# Patient Record
Sex: Male | Born: 1955 | Race: White | Hispanic: No | Marital: Married | Smoking: Current every day smoker
Health system: Southern US, Community
[De-identification: ages and names within clinical notes are randomized; demographics above are authoritative.]

## PROBLEM LIST (undated history)

## (undated) DIAGNOSIS — M47816 Spondylosis without myelopathy or radiculopathy, lumbar region: Secondary | ICD-10-CM

## (undated) DIAGNOSIS — I10 Essential (primary) hypertension: Secondary | ICD-10-CM

## (undated) DIAGNOSIS — G8928 Other chronic postprocedural pain: Secondary | ICD-10-CM

## (undated) DIAGNOSIS — M47817 Spondylosis without myelopathy or radiculopathy, lumbosacral region: Secondary | ICD-10-CM

## (undated) DIAGNOSIS — F3289 Other specified depressive episodes: Secondary | ICD-10-CM

## (undated) DIAGNOSIS — M48061 Spinal stenosis, lumbar region without neurogenic claudication: Secondary | ICD-10-CM

## (undated) DIAGNOSIS — F329 Major depressive disorder, single episode, unspecified: Secondary | ICD-10-CM

## (undated) DIAGNOSIS — G5700 Lesion of sciatic nerve, unspecified lower limb: Secondary | ICD-10-CM

## (undated) DIAGNOSIS — M5430 Sciatica, unspecified side: Secondary | ICD-10-CM

## (undated) HISTORY — DX: Major depressive disorder, single episode, unspecified: F32.9

## (undated) HISTORY — PX: KNEE SURGERY: SHX244

## (undated) HISTORY — DX: Other specified depressive episodes: F32.89

## (undated) HISTORY — DX: Spondylosis without myelopathy or radiculopathy, lumbar region: M47.816

## (undated) HISTORY — DX: Other chronic postprocedural pain: G89.28

## (undated) HISTORY — DX: Lesion of sciatic nerve, unspecified lower limb: G57.00

## (undated) HISTORY — DX: Spinal stenosis, lumbar region without neurogenic claudication: M48.061

## (undated) HISTORY — DX: Spondylosis without myelopathy or radiculopathy, lumbosacral region: M47.817

## (undated) HISTORY — DX: Sciatica, unspecified side: M54.30

---

## 2000-04-21 ENCOUNTER — Encounter (HOSPITAL_COMMUNITY): Admission: RE | Admit: 2000-04-21 | Discharge: 2000-05-21 | Payer: Self-pay | Admitting: Family Medicine

## 2000-04-24 ENCOUNTER — Encounter: Payer: Self-pay | Admitting: Urology

## 2000-04-24 ENCOUNTER — Ambulatory Visit (HOSPITAL_COMMUNITY): Admission: RE | Admit: 2000-04-24 | Discharge: 2000-04-24 | Payer: Self-pay | Admitting: Urology

## 2000-05-14 ENCOUNTER — Ambulatory Visit (HOSPITAL_COMMUNITY): Admission: RE | Admit: 2000-05-14 | Discharge: 2000-05-14 | Payer: Self-pay | Admitting: Urology

## 2000-05-14 ENCOUNTER — Encounter: Payer: Self-pay | Admitting: Urology

## 2000-10-28 ENCOUNTER — Ambulatory Visit (HOSPITAL_COMMUNITY): Admission: RE | Admit: 2000-10-28 | Discharge: 2000-10-28 | Payer: Self-pay | Admitting: Nephrology

## 2000-10-28 ENCOUNTER — Encounter: Payer: Self-pay | Admitting: Nephrology

## 2001-08-20 ENCOUNTER — Ambulatory Visit (HOSPITAL_COMMUNITY): Admission: RE | Admit: 2001-08-20 | Discharge: 2001-08-20 | Payer: Self-pay | Admitting: Internal Medicine

## 2001-08-25 ENCOUNTER — Encounter: Payer: Self-pay | Admitting: Internal Medicine

## 2001-08-25 ENCOUNTER — Ambulatory Visit (HOSPITAL_COMMUNITY): Admission: RE | Admit: 2001-08-25 | Discharge: 2001-08-25 | Payer: Self-pay | Admitting: Internal Medicine

## 2002-10-06 ENCOUNTER — Encounter
Admission: RE | Admit: 2002-10-06 | Discharge: 2003-01-04 | Payer: Self-pay | Admitting: Physical Medicine & Rehabilitation

## 2002-10-10 ENCOUNTER — Encounter: Payer: Self-pay | Admitting: Physical Medicine & Rehabilitation

## 2002-10-10 ENCOUNTER — Ambulatory Visit (HOSPITAL_COMMUNITY)
Admission: RE | Admit: 2002-10-10 | Discharge: 2002-10-10 | Payer: Self-pay | Admitting: Physical Medicine & Rehabilitation

## 2002-10-11 ENCOUNTER — Encounter (HOSPITAL_COMMUNITY)
Admission: RE | Admit: 2002-10-11 | Discharge: 2002-11-10 | Payer: Self-pay | Admitting: Physical Medicine & Rehabilitation

## 2002-11-28 ENCOUNTER — Emergency Department (HOSPITAL_COMMUNITY): Admission: EM | Admit: 2002-11-28 | Discharge: 2002-11-28 | Payer: Self-pay | Admitting: Emergency Medicine

## 2003-01-04 ENCOUNTER — Encounter
Admission: RE | Admit: 2003-01-04 | Discharge: 2003-04-04 | Payer: Self-pay | Admitting: Physical Medicine & Rehabilitation

## 2003-03-31 ENCOUNTER — Encounter
Admission: RE | Admit: 2003-03-31 | Discharge: 2003-06-29 | Payer: Self-pay | Admitting: Physical Medicine & Rehabilitation

## 2003-06-08 ENCOUNTER — Ambulatory Visit (HOSPITAL_COMMUNITY): Admission: RE | Admit: 2003-06-08 | Discharge: 2003-06-08 | Payer: Self-pay | Admitting: Family Medicine

## 2003-07-07 ENCOUNTER — Encounter
Admission: RE | Admit: 2003-07-07 | Discharge: 2003-10-05 | Payer: Self-pay | Admitting: Physical Medicine & Rehabilitation

## 2003-09-29 ENCOUNTER — Ambulatory Visit: Payer: Self-pay | Admitting: Physical Medicine & Rehabilitation

## 2003-09-29 ENCOUNTER — Encounter
Admission: RE | Admit: 2003-09-29 | Discharge: 2003-12-28 | Payer: Self-pay | Admitting: Physical Medicine & Rehabilitation

## 2003-12-04 ENCOUNTER — Encounter
Admission: RE | Admit: 2003-12-04 | Discharge: 2004-03-03 | Payer: Self-pay | Admitting: Physical Medicine & Rehabilitation

## 2003-12-05 ENCOUNTER — Ambulatory Visit: Payer: Self-pay | Admitting: Physical Medicine & Rehabilitation

## 2004-02-02 ENCOUNTER — Ambulatory Visit: Payer: Self-pay | Admitting: Physical Medicine & Rehabilitation

## 2004-04-22 ENCOUNTER — Encounter
Admission: RE | Admit: 2004-04-22 | Discharge: 2004-07-21 | Payer: Self-pay | Admitting: Physical Medicine & Rehabilitation

## 2004-04-24 ENCOUNTER — Ambulatory Visit: Payer: Self-pay | Admitting: Physical Medicine & Rehabilitation

## 2004-06-13 ENCOUNTER — Ambulatory Visit: Payer: Self-pay | Admitting: Physical Medicine & Rehabilitation

## 2004-08-01 ENCOUNTER — Encounter
Admission: RE | Admit: 2004-08-01 | Discharge: 2004-10-30 | Payer: Self-pay | Admitting: Physical Medicine & Rehabilitation

## 2004-08-05 ENCOUNTER — Ambulatory Visit: Payer: Self-pay | Admitting: Physical Medicine & Rehabilitation

## 2004-10-01 ENCOUNTER — Ambulatory Visit: Payer: Self-pay | Admitting: Physical Medicine & Rehabilitation

## 2004-11-28 ENCOUNTER — Ambulatory Visit: Payer: Self-pay | Admitting: Physical Medicine & Rehabilitation

## 2004-11-28 ENCOUNTER — Encounter
Admission: RE | Admit: 2004-11-28 | Discharge: 2005-02-26 | Payer: Self-pay | Admitting: Physical Medicine & Rehabilitation

## 2004-12-30 ENCOUNTER — Ambulatory Visit: Payer: Self-pay | Admitting: Physical Medicine & Rehabilitation

## 2005-02-25 ENCOUNTER — Ambulatory Visit: Payer: Self-pay | Admitting: Physical Medicine & Rehabilitation

## 2005-04-04 ENCOUNTER — Ambulatory Visit: Payer: Self-pay | Admitting: Physical Medicine & Rehabilitation

## 2005-04-04 ENCOUNTER — Encounter
Admission: RE | Admit: 2005-04-04 | Discharge: 2005-07-03 | Payer: Self-pay | Admitting: Physical Medicine & Rehabilitation

## 2005-05-02 ENCOUNTER — Ambulatory Visit: Payer: Self-pay | Admitting: Physical Medicine & Rehabilitation

## 2005-05-08 ENCOUNTER — Ambulatory Visit (HOSPITAL_COMMUNITY)
Admission: RE | Admit: 2005-05-08 | Discharge: 2005-05-08 | Payer: Self-pay | Admitting: Physical Medicine & Rehabilitation

## 2005-05-29 ENCOUNTER — Ambulatory Visit: Payer: Self-pay | Admitting: Physical Medicine & Rehabilitation

## 2005-06-20 ENCOUNTER — Ambulatory Visit: Payer: Self-pay | Admitting: Physical Medicine & Rehabilitation

## 2005-07-22 ENCOUNTER — Ambulatory Visit: Payer: Self-pay | Admitting: Physical Medicine & Rehabilitation

## 2005-07-22 ENCOUNTER — Encounter
Admission: RE | Admit: 2005-07-22 | Discharge: 2005-10-20 | Payer: Self-pay | Admitting: Physical Medicine & Rehabilitation

## 2005-09-04 ENCOUNTER — Ambulatory Visit: Payer: Self-pay | Admitting: Physical Medicine & Rehabilitation

## 2005-10-29 ENCOUNTER — Ambulatory Visit: Payer: Self-pay | Admitting: Physical Medicine & Rehabilitation

## 2005-10-29 ENCOUNTER — Encounter
Admission: RE | Admit: 2005-10-29 | Discharge: 2006-01-27 | Payer: Self-pay | Admitting: Physical Medicine & Rehabilitation

## 2005-12-24 ENCOUNTER — Ambulatory Visit: Payer: Self-pay | Admitting: Physical Medicine & Rehabilitation

## 2006-02-18 ENCOUNTER — Ambulatory Visit: Payer: Self-pay | Admitting: Physical Medicine & Rehabilitation

## 2006-02-18 ENCOUNTER — Encounter
Admission: RE | Admit: 2006-02-18 | Discharge: 2006-05-19 | Payer: Self-pay | Admitting: Physical Medicine & Rehabilitation

## 2006-04-02 ENCOUNTER — Ambulatory Visit: Payer: Self-pay | Admitting: Physical Medicine & Rehabilitation

## 2006-05-06 ENCOUNTER — Ambulatory Visit: Payer: Self-pay | Admitting: Physical Medicine & Rehabilitation

## 2006-06-10 ENCOUNTER — Ambulatory Visit: Payer: Self-pay | Admitting: Physical Medicine & Rehabilitation

## 2006-06-10 ENCOUNTER — Encounter
Admission: RE | Admit: 2006-06-10 | Discharge: 2006-06-11 | Payer: Self-pay | Admitting: Physical Medicine & Rehabilitation

## 2006-07-14 ENCOUNTER — Encounter
Admission: RE | Admit: 2006-07-14 | Discharge: 2006-10-12 | Payer: Self-pay | Admitting: Physical Medicine & Rehabilitation

## 2006-08-13 ENCOUNTER — Ambulatory Visit: Payer: Self-pay | Admitting: Physical Medicine & Rehabilitation

## 2006-10-08 ENCOUNTER — Encounter
Admission: RE | Admit: 2006-10-08 | Discharge: 2007-01-06 | Payer: Self-pay | Admitting: Physical Medicine & Rehabilitation

## 2006-10-15 ENCOUNTER — Ambulatory Visit: Payer: Self-pay | Admitting: Physical Medicine & Rehabilitation

## 2006-12-14 ENCOUNTER — Ambulatory Visit: Payer: Self-pay | Admitting: Physical Medicine & Rehabilitation

## 2007-01-01 ENCOUNTER — Encounter
Admission: RE | Admit: 2007-01-01 | Discharge: 2007-04-01 | Payer: Self-pay | Admitting: Physical Medicine & Rehabilitation

## 2007-02-08 ENCOUNTER — Ambulatory Visit: Payer: Self-pay | Admitting: Physical Medicine & Rehabilitation

## 2007-03-09 ENCOUNTER — Encounter
Admission: RE | Admit: 2007-03-09 | Discharge: 2007-06-07 | Payer: Self-pay | Admitting: Physical Medicine & Rehabilitation

## 2007-03-18 ENCOUNTER — Ambulatory Visit (HOSPITAL_COMMUNITY)
Admission: RE | Admit: 2007-03-18 | Discharge: 2007-03-18 | Payer: Self-pay | Admitting: Physical Medicine & Rehabilitation

## 2007-04-05 ENCOUNTER — Ambulatory Visit: Payer: Self-pay | Admitting: Physical Medicine & Rehabilitation

## 2007-04-09 ENCOUNTER — Ambulatory Visit (HOSPITAL_COMMUNITY)
Admission: RE | Admit: 2007-04-09 | Discharge: 2007-04-09 | Payer: Self-pay | Admitting: Physical Medicine & Rehabilitation

## 2007-04-12 ENCOUNTER — Encounter (HOSPITAL_COMMUNITY)
Admission: RE | Admit: 2007-04-12 | Discharge: 2007-05-12 | Payer: Self-pay | Admitting: Physical Medicine & Rehabilitation

## 2007-05-06 ENCOUNTER — Ambulatory Visit: Payer: Self-pay | Admitting: Physical Medicine & Rehabilitation

## 2007-05-14 ENCOUNTER — Encounter (HOSPITAL_COMMUNITY)
Admission: RE | Admit: 2007-05-14 | Discharge: 2007-06-13 | Payer: Self-pay | Admitting: Physical Medicine & Rehabilitation

## 2007-06-02 ENCOUNTER — Ambulatory Visit: Payer: Self-pay | Admitting: Physical Medicine & Rehabilitation

## 2007-06-29 ENCOUNTER — Encounter
Admission: RE | Admit: 2007-06-29 | Discharge: 2007-09-27 | Payer: Self-pay | Admitting: Physical Medicine & Rehabilitation

## 2007-06-30 ENCOUNTER — Ambulatory Visit: Payer: Self-pay | Admitting: Physical Medicine & Rehabilitation

## 2007-07-27 ENCOUNTER — Ambulatory Visit: Payer: Self-pay | Admitting: Physical Medicine & Rehabilitation

## 2007-08-24 ENCOUNTER — Ambulatory Visit: Payer: Self-pay | Admitting: Physical Medicine & Rehabilitation

## 2007-09-21 ENCOUNTER — Ambulatory Visit: Payer: Self-pay | Admitting: Physical Medicine & Rehabilitation

## 2007-10-13 ENCOUNTER — Encounter
Admission: RE | Admit: 2007-10-13 | Discharge: 2008-01-11 | Payer: Self-pay | Admitting: Physical Medicine & Rehabilitation

## 2007-10-20 ENCOUNTER — Ambulatory Visit: Payer: Self-pay | Admitting: Physical Medicine & Rehabilitation

## 2007-11-19 ENCOUNTER — Ambulatory Visit: Payer: Self-pay | Admitting: Physical Medicine & Rehabilitation

## 2007-12-01 ENCOUNTER — Encounter
Admission: RE | Admit: 2007-12-01 | Discharge: 2008-02-29 | Payer: Self-pay | Admitting: Physical Medicine & Rehabilitation

## 2007-12-02 ENCOUNTER — Ambulatory Visit: Payer: Self-pay | Admitting: Physical Medicine & Rehabilitation

## 2007-12-31 ENCOUNTER — Ambulatory Visit: Payer: Self-pay | Admitting: Physical Medicine & Rehabilitation

## 2008-01-03 ENCOUNTER — Ambulatory Visit: Payer: Self-pay | Admitting: Physical Medicine & Rehabilitation

## 2008-02-04 ENCOUNTER — Ambulatory Visit: Payer: Self-pay | Admitting: Physical Medicine & Rehabilitation

## 2008-02-04 ENCOUNTER — Encounter
Admission: RE | Admit: 2008-02-04 | Discharge: 2008-05-04 | Payer: Self-pay | Admitting: Physical Medicine & Rehabilitation

## 2008-03-01 ENCOUNTER — Ambulatory Visit: Payer: Self-pay | Admitting: Physical Medicine & Rehabilitation

## 2008-03-31 ENCOUNTER — Ambulatory Visit: Payer: Self-pay | Admitting: Physical Medicine & Rehabilitation

## 2008-04-28 ENCOUNTER — Ambulatory Visit: Payer: Self-pay | Admitting: Physical Medicine & Rehabilitation

## 2008-05-18 ENCOUNTER — Encounter
Admission: RE | Admit: 2008-05-18 | Discharge: 2008-07-11 | Payer: Self-pay | Admitting: Physical Medicine & Rehabilitation

## 2008-05-25 ENCOUNTER — Ambulatory Visit: Payer: Self-pay | Admitting: Physical Medicine & Rehabilitation

## 2008-06-27 ENCOUNTER — Ambulatory Visit: Payer: Self-pay | Admitting: Physical Medicine & Rehabilitation

## 2008-07-26 ENCOUNTER — Ambulatory Visit: Payer: Self-pay | Admitting: Physical Medicine & Rehabilitation

## 2008-07-26 ENCOUNTER — Encounter
Admission: RE | Admit: 2008-07-26 | Discharge: 2008-10-24 | Payer: Self-pay | Admitting: Physical Medicine & Rehabilitation

## 2008-08-25 ENCOUNTER — Ambulatory Visit: Payer: Self-pay | Admitting: Physical Medicine & Rehabilitation

## 2008-09-20 ENCOUNTER — Ambulatory Visit: Payer: Self-pay | Admitting: Physical Medicine & Rehabilitation

## 2008-10-18 ENCOUNTER — Ambulatory Visit: Payer: Self-pay | Admitting: Physical Medicine & Rehabilitation

## 2008-11-09 ENCOUNTER — Encounter
Admission: RE | Admit: 2008-11-09 | Discharge: 2009-01-10 | Payer: Self-pay | Admitting: Physical Medicine & Rehabilitation

## 2008-11-17 ENCOUNTER — Ambulatory Visit: Payer: Self-pay | Admitting: Physical Medicine & Rehabilitation

## 2008-12-12 ENCOUNTER — Ambulatory Visit: Payer: Self-pay | Admitting: Physical Medicine & Rehabilitation

## 2009-01-02 ENCOUNTER — Ambulatory Visit: Payer: Self-pay | Admitting: Physical Medicine & Rehabilitation

## 2009-01-17 ENCOUNTER — Ambulatory Visit: Payer: Self-pay | Admitting: Physical Medicine & Rehabilitation

## 2009-01-17 ENCOUNTER — Encounter
Admission: RE | Admit: 2009-01-17 | Discharge: 2009-04-17 | Payer: Self-pay | Admitting: Physical Medicine & Rehabilitation

## 2009-02-15 ENCOUNTER — Ambulatory Visit: Payer: Self-pay | Admitting: Physical Medicine & Rehabilitation

## 2009-03-15 ENCOUNTER — Ambulatory Visit: Payer: Self-pay | Admitting: Physical Medicine & Rehabilitation

## 2009-04-13 ENCOUNTER — Ambulatory Visit: Payer: Self-pay | Admitting: Physical Medicine & Rehabilitation

## 2009-05-03 ENCOUNTER — Encounter
Admission: RE | Admit: 2009-05-03 | Discharge: 2009-08-01 | Payer: Self-pay | Source: Home / Self Care | Admitting: Physical Medicine & Rehabilitation

## 2009-05-09 ENCOUNTER — Ambulatory Visit: Payer: Self-pay | Admitting: Physical Medicine & Rehabilitation

## 2009-06-06 ENCOUNTER — Ambulatory Visit: Payer: Self-pay | Admitting: Physical Medicine & Rehabilitation

## 2009-07-23 ENCOUNTER — Ambulatory Visit: Payer: Self-pay | Admitting: Physical Medicine & Rehabilitation

## 2009-08-17 ENCOUNTER — Encounter
Admission: RE | Admit: 2009-08-17 | Discharge: 2009-11-15 | Payer: Self-pay | Admitting: Physical Medicine & Rehabilitation

## 2009-08-23 ENCOUNTER — Ambulatory Visit: Payer: Self-pay | Admitting: Physical Medicine & Rehabilitation

## 2009-09-20 ENCOUNTER — Ambulatory Visit: Payer: Self-pay | Admitting: Physical Medicine & Rehabilitation

## 2009-10-19 ENCOUNTER — Ambulatory Visit: Payer: Self-pay | Admitting: Physical Medicine & Rehabilitation

## 2009-11-09 ENCOUNTER — Encounter
Admission: RE | Admit: 2009-11-09 | Discharge: 2010-01-09 | Payer: Self-pay | Source: Home / Self Care | Attending: Physical Medicine & Rehabilitation | Admitting: Physical Medicine & Rehabilitation

## 2009-11-15 ENCOUNTER — Ambulatory Visit: Payer: Self-pay | Admitting: Physical Medicine & Rehabilitation

## 2009-12-13 ENCOUNTER — Ambulatory Visit: Payer: Self-pay | Admitting: Physical Medicine & Rehabilitation

## 2010-01-09 ENCOUNTER — Ambulatory Visit: Payer: Self-pay | Admitting: Physical Medicine & Rehabilitation

## 2010-02-06 ENCOUNTER — Encounter
Admission: RE | Admit: 2010-02-06 | Discharge: 2010-02-07 | Payer: Self-pay | Source: Home / Self Care | Attending: Physical Medicine & Rehabilitation | Admitting: Physical Medicine & Rehabilitation

## 2010-02-07 ENCOUNTER — Ambulatory Visit
Admission: RE | Admit: 2010-02-07 | Discharge: 2010-02-07 | Payer: Self-pay | Source: Home / Self Care | Attending: Physical Medicine & Rehabilitation | Admitting: Physical Medicine & Rehabilitation

## 2010-03-07 ENCOUNTER — Ambulatory Visit: Payer: BC Managed Care – PPO

## 2010-03-07 ENCOUNTER — Encounter: Payer: Medicare Other | Attending: Physical Medicine & Rehabilitation

## 2010-03-07 DIAGNOSIS — F329 Major depressive disorder, single episode, unspecified: Secondary | ICD-10-CM

## 2010-03-07 DIAGNOSIS — G5700 Lesion of sciatic nerve, unspecified lower limb: Secondary | ICD-10-CM

## 2010-03-07 DIAGNOSIS — M47817 Spondylosis without myelopathy or radiculopathy, lumbosacral region: Secondary | ICD-10-CM | POA: Insufficient documentation

## 2010-03-07 DIAGNOSIS — M5380 Other specified dorsopathies, site unspecified: Secondary | ICD-10-CM

## 2010-04-09 ENCOUNTER — Ambulatory Visit: Payer: Medicare Other | Admitting: Physical Medicine & Rehabilitation

## 2010-05-14 ENCOUNTER — Encounter: Payer: Medicare Other | Attending: Physical Medicine & Rehabilitation | Admitting: Physical Medicine & Rehabilitation

## 2010-05-14 DIAGNOSIS — F329 Major depressive disorder, single episode, unspecified: Secondary | ICD-10-CM

## 2010-05-14 DIAGNOSIS — M5380 Other specified dorsopathies, site unspecified: Secondary | ICD-10-CM

## 2010-05-14 DIAGNOSIS — M545 Low back pain, unspecified: Secondary | ICD-10-CM | POA: Insufficient documentation

## 2010-05-14 DIAGNOSIS — X58XXXS Exposure to other specified factors, sequela: Secondary | ICD-10-CM | POA: Insufficient documentation

## 2010-05-14 DIAGNOSIS — S4490XS Injury of unspecified nerve at shoulder and upper arm level, unspecified arm, sequela: Secondary | ICD-10-CM | POA: Insufficient documentation

## 2010-05-14 DIAGNOSIS — G5700 Lesion of sciatic nerve, unspecified lower limb: Secondary | ICD-10-CM

## 2010-05-14 DIAGNOSIS — M47817 Spondylosis without myelopathy or radiculopathy, lumbosacral region: Secondary | ICD-10-CM | POA: Insufficient documentation

## 2010-05-15 NOTE — Assessment & Plan Note (Signed)
Dylan Farley is back regarding his low back pain.  He has problems finding the Opana as many other patient's have.  He had some personal issues and misses appointment last month and has been stretching his medications particularly the short-acting medication as a result.  Pain today is 5- 8/10.  He describes some increased pain due to some recent work he has been doing and driving.  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  Sleep is fair.  REVIEW OF SYSTEMS:  Notable for weight loss and night sweats.  Full 12- point review is in the written health and history section of the chart.  SOCIAL HISTORY:  Unchanged.  He is living with his wife.  PHYSICAL EXAMINATION:  VITAL SIGNS:  Blood pressure is 134/82, pulse is 82, respiratory rate is 18 and he is satting 100% on room air. GENERAL:  The patient is pleasant, alert and oriented x3.  Affect is generally bright and appropriate. EXTREMITIES:  He has had some tenderness in the low back with extension worse on the flexion, but both are provoking.  He seemed to be uncomfortable sitting today.  His strength is 5/5 and normal sensory exam throughout all four extremities.  ASSESSMENT: 1. Lumbar spondylosis with facet arthropathy. 2. History of ulnar neurapraxic injury in the left upper extremity.  PLAN:  We will switch to fentanyl patch 50 mcg q.72 h. to replace his Opana and his oxycodone 10 for breakthrough pain #10 and #90 respectively were written today.  May need further titration of his patch, but did not want to overshoot with this.  I believe he may have tried this remotely and this may have caused some nausea so the patient was asked to be aware of this.  He will call me with any problems or questions.     Ranelle Oyster, M.D. Electronically Signed    ZTS/MedQ D:  05/14/2010 11:07:03  T:  05/15/2010 00:17:56  Job #:  [id number]

## 2010-05-28 NOTE — Assessment & Plan Note (Signed)
Dylan Farley is back regarding his back pain.  He has not seen much of a  change with the switch to Opana.  He has not gotten worse however.  Back  still remains at a 6-7/10 from a pain standpoint.  Pain is intermittent,  aching.  He has worse pain with activity, although he seems to have  become more reclusive over the last several months.  He does not get out  much except to watch his dog.  He does not like to go to church, etc.  We started Cymbalta, and he does not feel that that has made a dramatic  difference in his mod at this point.   REVIEW OF SYSTEMS:  Notable for the above.  Full review is in the  written health and history section in the chart.  Patient states that  his sciatica in his leg is better overall.   SOCIAL HISTORY:  Patient is married, living with  his wife.  He does not  smoke.   PHYSICAL EXAMINATION:  Blood pressure 137/83, pulse 77, respiratory rate  18.  He is satting 99% on room air.  Patient is pleasant, remains flat, however, and very quiet.  He had good  lumbar flexion once again with minimal leg symptoms.  He had more pain  in the back with extension of facet provocation.  Strength is 5/5.  He  remains tender in the lumbar paraspinals and facets from L4 to S1.  HEART:  Regular.  CHEST:  Clear.  ABDOMEN:  Soft, nontender.   ASSESSMENT:  1. Chronic low back pain related to lumbar disk disease and facet      arthropathy.  2. History of piriformis syndrome on the right.  3. History of left plantar fasciitis.  4. Depression.   PLAN:  1. We will increase Opana ER to 40 mg q.12 hours with same Opana IR      dose.  2. Continue Flexeril, Lyrica and Elavil.  3. Maintain Cymbalta at 60 mg daily.  4. We will send patient for MRI of low back to see if any further      changes have come about considering the fact that his pain has      generally worsened over the last year's time.  I do not think he is      a surgical candidate.  I really think his pain is out of  proportion      to his prior radiographical evidence.  We need to work on leisure      activities and socialization.  I think there may a large mood      component to this picture.  5. I will see [name] back in one month's time.      Ranelle Oyster, M.D.  Electronically Signed     ZTS/MedQ  D:  03/10/2007 10:46:34  T:  03/10/2007 16:46:22  Job #:  [id number]

## 2010-05-28 NOTE — Procedures (Signed)
NAME:  Dylan Farley, ARTS NO.:  [id number]   MEDICAL RECORD NO.:  [id number]           PATIENT TYPE:   LOCATION:                                 FACILITY:   PHYSICIAN:  Erick Colace, M.D.DATE OF BIRTH:  30-Mar-1955   DATE OF PROCEDURE:  11/19/2007  DATE OF DISCHARGE:                               OPERATIVE REPORT   PROCEDURE:  Right paramedian L5-S1 translaminar lumbar epidural steroid  injection under fluoroscopic guidance.   INDICATION:  Right greater than left-sided chronic low back pain.   Pain is only partially responsive to medication management including  narcotic analgesics.  He has a broad-based disk bulge at L5-S1.  This  pain interferes with self-care and mobility, interferes with prolonged  sitting or standing.   Oswestry Disability Index score today 42%.   The patient's informed consent was obtained after describing risks and  benefits of the procedure to the patient.  These include bleeding,  bruising, and infection.  He elects to proceed and has given written  consent.  The patient was placed prone on fluoroscopy table.  Betadine  prep, sterile drape, a 25-gauge 1.5 needle was used to anesthetize the  skin and subcutaneous tissue with 1% lidocaine x2 mL.  An 18-gauge 8-cm  Hustead needle was inserted under fluoroscopic guidance targeting the L5-  S1 interlaminar space, right paramedian approach, AP lateral images were  utilized.  Loss-of-resistance technique was employed under the lateral  imaging.  Positive loss obtained with 50:50 saline-air mixture.  Then,  Omnipaque 180 under live fluoro demonstrated good epidural spread, x1 mL  injected followed by injection of 3 mL of 1% MPF lidocaine and 2 mL of  Depo-Medrol 40 mg/mL.  The patient tolerated procedure well.  Post  injection instructions given.      Erick Colace, M.D.  Electronically Signed     AEK/MEDQ  D:  12/02/2007 13:22:09  T:  12/03/2007 02:05:04  Job:  [id number]

## 2010-05-28 NOTE — Assessment & Plan Note (Signed)
FOLLOW UP OFFICE NOTE   SUBJECTIVE:  Dylan Farley is back regarding his chronic low back pain.  He has  not really experienced any changes since I last saw him.  The pain  remains a 5-6/10.  He remains on Lyrica, Kadian and Vicodin for  breakthrough pain.  He uses Flexeril occasionally at night for spasm.  Sleep is a bit fair.  Pain is in the low back.  Occasional pain in the  right buttock and legs.  He can walk about 60 minutes at a time.  He  does do range of motion exercises and is active somewhat around the  home, although he has to watch his exertion level.   REVIEW OF SYSTEMS:  The patient reports recent weight gain and  occasional night sweats, otherwise, he is stable.  Reviews are written  in health and history section of the chart.   SOCIAL HISTORY:  The patient is living with his wife.  He recently went  on a trip to [place] and found that very beneficial from a mood  standpoint.   OBJECTIVE EXAM:  VITAL SIGNS:  Blood pressure 133/71, pulse 70,  respiratory rate 18, sating 100% on room air.  The patient is pleasant,  in no acute distress.  He is alert and oriented x 3.  He is wearing his  lumbosacral orthosis and finds it beneficial for his stance stability.  He is stable with flexion at about 60 to 70 degrees.  Extension was 15-  20 degrees with minimal discomfort today.  He is able to rotate and  lateral bend to either side.  HEART:  Regular.  CHEST:  Clear.  ABDOMEN:  Soft and nontender.  Motor, sensory and reflex function is  within normal limits.   ASSESSMENT:  1. Chronic low back pain consistent with lumbar facet disease and      sacroiliac dysfunction.  2. 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  3. History of left heel spurs/plantar fasciitis.   TREATMENT PLAN:  1. Continue lumbosacral orthosis.  2. Refill Kadian, Vicodin and Flexeril today.  Maintain Lyrica at      current dosing.  3. I will see the patient back in 3 months with nurse clinic follow up      in one  month.      Ranelle Oyster, M.D.  Electronically Signed     ZTS/MedQ  D:  08/17/2006 10:07:51  T:  08/17/2006 10:26:35  Job #:  [id number]

## 2010-05-28 NOTE — Assessment & Plan Note (Signed)
Dylan Farley is back regarding his back pain.  We had an MRI of his back done  which really showed ongoing facet disease and no other new pathology.  Opana increase helped his back pain quite a bit lowering it to the 5 out  of 10 range though he still has symptoms.  He remains inactive for the  most part, staying at home and around the house much of the day.  He  complains of a 2-week history of right arm numbness without pain.  His  symptoms started at the shoulder and radiated into the hand.  He noticed  it after waking up in the morning one day.  He has had some tremor with  it and possibly some weakness, but no other definable symptoms are  noted.   The patient's pain interferes with general activity, relations with  others, and enjoyment of life on a moderate level.  He finds some relief  with his medications as well as rest.   REVIEW OF SYSTEMS:  Notable for the above.  Full review is in the  written health and history section of the chart.  He still remains  depressed.   SOCIAL HISTORY:  The patient is married and living with his wife.   PHYSICAL EXAMINATION:  Blood pressure is 133/67, pulse is 72,  respiratory rate 18.  He is satting 99% on room air.  The patient is generally alert and appropriate.  He is a bit flat.  Posture and movement are unchanged.  He has pain with extension and facet provocation.  Strength is 5 out of  5 in the lower extremities with lumbar tenderness noted, particularly at  the facet regions of L4 to S1.  HEART:  Regular.  CHEST:  Clear.  ABDOMEN:  Soft, nontender.  Bilateral upper extremities were tested, and he appeared to have some  trace intrinsic muscle weakness in the right hand, although no other  definable weakness is noted.  He had no specific sensory dermatome loss  today.  Reflexes appeared to be 1+ in all 6 testing sites in the upper  extremity.  No neck pain was appreciated.  Sperling's test was negative.   ASSESSMENT:  1. Chronic low back  pain related to facet arthropathy.  2. History of piriformis syndrome.  3. Depression.  4. New right-sided arm numbness for the past 2 weeks.   PLAN:  1. Considering patient's sensory loss in the right upper extremity and      intrinsic muscle weakness, we could be looking at a radiculopathy      at C7-C8 here.  I would like to order an MRI without contrast to      rule out radiculopathy.  2. I will send the patient for outpatient physical therapy work on low      back, range of motion, posture, strengthening, and home exercise      program.  3. Continue Opana ER 40 mg q.12 h. as scheduled.  We will increase      Opana IR to 10 mg q.6 h. p.r.n.  4. Continue Flexeril, Lyrica, Elavil.  5. Maintain Cymbalta for now but look at other options there.  6. I will discuss followup with patient pending MRI findings.      Ranelle Oyster, M.D.  Electronically Signed     ZTS/MedQ  D:  04/07/2007 10:21:01  T:  04/07/2007 13:12:48  Job #:  [id number]

## 2010-05-28 NOTE — Assessment & Plan Note (Signed)
Dylan Farley is back regarding his low back pain.  The pain has generally been  at the same level it has been over the last several months at a 5-6/10.  He seems to be growing a bit weary with the persistent difficulties and  limitations he has with his function.  He describes his pain as  intermittently dull and aching at times.  Pain is more bothersome with  standing as well as bending and sitting.  It usually hurts when he rises  from a seated to standing position.  He has pain with activities around  the house, activities in the yard as well.  Sleep is fair.  He denies  any right gluteal pain or significant leg symptoms.  Pain seems to be  localized in the low back and upper pelvic regions.   REVIEW OF SYSTEMS:  Notable for the above.  Full review is in the  written health issue section in chart.   SOCIAL HISTORY:  Patient is married and lives with his wife.  He does  not smoke.   PHYSICAL EXAMINATION:  VITAL SIGNS:  Blood pressure is 132/72, pulse 90,  respiratory rate 18; he is saturating 98% on room air.  MENTAL STATUS:  Affect is flat and further downbeat today.  NEUROLOGIC:  Gait was generally stable.  SPINE:  He had good lumbar flexion of at least 80-90 degrees today with  minimal symptoms.  He had more pain today with extension at facet  maneuvers as well as some rotation to either side.  Strength and  reflexes appear to be preserved.  Patient had some tenderness to  palpation over the lumbar paraspinals and facets at L4 through S1.  Some  muscle spasm was noted as well.  HEART:  Regular.  CHEST:  Clear.  ABDOMEN:  Soft, nontender.   ASSESSMENT:  1. Lumbar facet syndrome and mild degenerative disk disease.  2. History of piriformis syndrome on the right.  3. History of left plantar fasciitis.   PLAN:  1. Will try a change-over from his Kadian to Opana ER.  Will use 20 mg      q.12h. to start with Opana immediate release 5 mg for breakthrough      pain.  We may need to  titrate up his Opana further but would like      to avoid excessive side effects initially.  2. Continue Flexeril and Lyrica as well as Elavil.  3. Begin Cymbalta to help with his mood and overall pain picture.      Begin 30 mg daily and titrate up to 60 mg after 1 week's time.  4. I will see him back in about a month to follow up his progress.      Patient may be a spinal stimulator candidate.      Dylan Farley, M.D.  Electronically Signed     ZTS/MedQ  D:  02/09/2007 10:07:29  T:  02/09/2007 11:41:29  Job #:  [id number]

## 2010-05-28 NOTE — Assessment & Plan Note (Signed)
[name] is back regarding his back pain.  He really has not had much  change lately.  Pain is 6-7/10.  Described as sharp, intermittent, and  aching.  He noted no change with Wellbutrin.  Pain is worse with bending  and sitting now more than standing.  I think there has been a general  change in his presentation of pain over the last few months.  Pain has  increased today after he was doing some work in the yard and he feels  rather bad today.  Sleep is fair.   REVIEW OF SYSTEMS:  Notable for fever, chills, weight loss, and  constipation.  Other pertinent positives are as above.  Full reviews in  written health history section of the chart.   CURRENT MEDICATIONS:  Opana ER 40 mg q.12 hours, Flexeril 10 mg q.8  hours p.r.n., oxycodone 5 mg q.6 hours p.r.n., and Wellbutrin SR 150 mg  daily.   I reviewed his MRI and it notes again the lumbar facet disease as well  as the broad-based disk bulge at L5-S1, which is generally mild.   SOCIAL HISTORY:  The patient is married, living with his wife.  No other  changes were noted today.   PHYSICAL EXAMINATION:  VITAL SIGNS:  Blood pressure 144/82, his pulse is  92, and respiratory rate is 18.  He is sating 100% on room air.  GENERAL:  The patient is generally pleasant, alert.  His affect is a bit  flat today.  He has more pain with flexion and extension today.  Sitting  seems to cause more pain.  He had milder pain with extension I felt he  had previously.  Seated slump test caused some back pain but no pain in  the legs.  Noted straight leg raising today.  HEART:  Regular rate.  CHEST:  Clear.  ABDOMEN:  Soft and nontender.  He remains slight of build..   ASSESSMENT:  1. Chronic low back pain related to lumbar facet arthropathy.  2. Mild cervical spondylosis.  3. Depression.   PLAN:  1. Continue Opana ER 40 mg q.12 hours, Flexeril, Elavil on board as      well.  Oxycodone immediate release 10 mg q.6 hours p.r.n.  2. Wellbutrin SR  trial 150 mg b.i.d.  3. We will send the patient to Dr. Wynn Banker for L5-S1 translaminar      epidural injection.  See what benefit he has.  I do feel his      symptoms are more disk related in nature than they had been in the      past.  4. I will see him back.      Ranelle Oyster, M.D.  Electronically Signed     ZTS/MedQ  D:  11/19/2007 13:26:02  T:  11/20/2007 02:30:01  Job #:  [id number]

## 2010-05-28 NOTE — Assessment & Plan Note (Signed)
Dylan Farley is back regarding his chronic low back pain.  He has been doing a  bit better recently.  Pain is 5-6/10.  His Oswestry score is 42%.  I had  him see Eula Flax last month for mood evaluation and Dr. Leonides Cave  noted that he had some reclusiveness to the fact he was image conscious  due to his lack of teeth.  He has indicated that he needed ongoing  counseling of any type, however.  He has occasional radicular symptoms  in the leg, but Lyrica seems to be helping with those.  Pain interferes  with general activity, relations with others, and enjoyment of life on a  moderate level.   REVIEW OF SYSTEMS:  Notable for occasional dizziness, fever, weight  loss.  Full review is in the written health and history section.  Other  pertinent positives are above.   SOCIAL HISTORY:  Unchanged.   PHYSICAL EXAMINATION:  VITAL SIGNS:  Blood pressure is 132/78, pulse 68,  respiratory rate 80.  He is sating 100% on room air.  GENERAL:  The patient is pleasant, alert and oriented x3.  EXTREMITIES:  He has some pain with extension again today, but stable  from last visit.  He has strength 5/5 in all 4 and normal sensory exam.  Affect appeared to be a bit brighter to me in general today.  Speech is  slightly dysarthric due to his lack of teeth.  HEART:  Regular.  CHEST:  Clear.  ABDOMEN:  Soft, nontender.   ASSESSMENT:  1. Chronic lumbar spondylosis last facet arthropathy with degenerative      disk disease.  2. Cervical spondylosis.  3. Questionable reactive depression.   PLAN:  1. Encourage more activity out of the home and increase exercise in      general over the next few months now the weather is improving.  2. We will continue Lyrica 150 mg t.i.d. with Opana ER 40 mg q.12 h.  3. We will change the oxycodone to Percocet 10/325 one q.6 h p.r.n.      #90.  4. We will see him back in 3 months with me and 1 month with nursing.      Ranelle Oyster, M.D.  Electronically  Signed     ZTS/MedQ  D:  03/31/2008 10:46:14  T:  04/01/2008 00:47:46  Job #:  [id number]

## 2010-05-28 NOTE — Assessment & Plan Note (Signed)
Dylan Farley is back regarding his back pain.  His hand symptoms have resolved.  His MRI was not really concordant with the symptoms he was describing,  regardless.  He has some arthropathy and left paracentral disk bulge at  C4-C5.  He did not find a lot of relief from the therapy.  He is doing  his home exercise program.  The increased Opana seems to be causing some  headaches.  He rates pain, overall, 6-7/10.  The pain is dull,  intermittent and sometimes sharp.  It is worse with prolonged standing  and walking.  Sleep is fair.  Pain interferes with his general activity,  relations with others, enjoyment of life on a moderate level.   REVIEW OF SYSTEMS:  Notable for manageable constipation, as well as some  weight loss with fevers and chills.   SOCIAL HISTORY:  The patient is married, living with his wife.  He does  some odds and ends around the house currently.   PHYSICAL EXAMINATION:  VITAL SIGNS:  Blood pressure 120/71, pulse 78,  respiratory rate 20, satting 98% on room air.  GENERAL:  The patient is extremely pleasant, alert and oriented x3.  Posture is fair.  He walks without antalgia.  Strength is 5/5 with  normal sensation.  Reflexes 2+.  HEART:  Regular.  CHEST:  Clear.  ABDOMEN:  Soft, nontender.   ASSESSMENT:  1. Chronic low back pain related to facet arthropathy.  2. Mild cervical spondylosis.  3. History of piriformis syndrome.  4. Depression.   PLAN:  1. Will change Opana immediate release back to oxycodone 10 mg.  2. Continue Opana ER 40 mg q. 12 hoursas well as Flexeril, Lyrica and      Elavil.  3. Home exercise program.  4. I will see him back in 3 months with nurse clinic followup in 1      month's time.      Ranelle Oyster, M.D.  Electronically Signed     ZTS/MedQ  D:  06/02/2007 12:49:09  T:  06/02/2007 13:56:10  Job #:  [id number]

## 2010-05-28 NOTE — Assessment & Plan Note (Signed)
Dylan Farley is back regarding his chronic back pain.  He had the right  paramedian L5-S1 translaminar injection by Dr. Wynn Banker on [place].  He noted relief for a few days, but really not much after that.  He  wonders if another injection could be beneficial.  He also feels that  perhaps his Lyrica was benefiting him in a general sense as well before.  He has been off it now a few months.  He rates his pain 6-7/10.  He is  on Opana ER 40 mg q.12 h. as well as oxycodone 5-10 mg q.6 h. p.r.n.  He  is taking Wellbutrin SR 150 mg b.i.d.  His Oswestry score was 44% today.  Pain described as intermittent and aching.  Pain interferes with his  general activity, relationship with others, and enjoyment of life on a  moderate level.   REVIEW OF SYSTEMS:  Notable for the above.  Full review is in the  written health and history section in the chart.  The patient denies  frank depression today.   SOCIAL HISTORY:  The patient is married, living with his wife.   PHYSICAL EXAMINATION:  VITAL SIGNS:  Blood pressure is 138/79, pulse 81,  respiratory rate 18, and he is sating 98% on room air.  GENERAL:  The patient is pleasant, alert, and oriented x3.  MUSCULOSKELETAL:  He continues to have some pain with flexion and  extension today.  He is able to bend to about 45 degrees forward and 10  degrees backwards.  He has some generalized low back pain with straight  leg raising today.  Motor exam is 5/5 in all 4 extremities, and sensory  exam is intact.  HEART:  Regular.  CHEST:  Clear.  ABDOMEN:  Soft and nontender.   ASSESSMENT:  1. Chronic low back pain related to lumbar spondylosis with facet      arthropathy and degenerative disk disease.  2. Cervical spondylosis.  3. Depression.   PLAN:  1. We will send the patient back to Dr. Wynn Banker for 1 more injection      in the L5-S1 translaminar space.  2. We will check testosterone levels as he appears to be a right      candidate for hypotestosterone  based on his appearance and lack of      overall progress in the last several months.  3. We will resume Lyrica 75 mg at bedtime titrating up to t.i.d. over      1 week's time.  4. Refilled Opana ER and his oxycodone today, #60 and 180      respectively.  5. I will see him back pending the above.      Ranelle Oyster, M.D.  Electronically Signed     ZTS/MedQ  D:  12/31/2007 10:05:43  T:  01/01/2008 01:43:12  Job #:  [id number]

## 2010-05-28 NOTE — Assessment & Plan Note (Signed)
Dylan Farley is back regarding his low back pain.  The oxycodone is tolerated  little bit better for his breakthrough pain but he does not note much  difference over the Opana for pain from a breakthrough standpoint.  Mood  still is fairly flat.  He does not feel the Cymbalta has helped  dramatically.  The patient rates his pain at 5-7/10, usually depending  on the activity level.  Pain is intermittent, constant, and aching.  Also, there is pain in shoulder but this is not doing him much else.  Pain interferes with his general activity, relations with others, and  enjoyment of life on a moderate level.  Pain improves with rest and his  medications.  He is not doing much in the way of home exercising at the  movement but has been trying.   REVIEW OF SYSTEMS:  Notable for the above.  Full review is in the  written health and history section in the chart.   SOCIAL HISTORY:  The patient is married, living with his wife.   PHYSICAL EXAMINATION:  VITAL SIGNS:  Blood pressure is 135/73, pulse 86,  respiratory rate 18, and he is sating 98% on room air.  GENERAL:  The patient is pleasant, alert, and oriented x3.  Affect is  flat, does talk and joke occasionally.  Gait is normal.  He does have  pain with flexion but mostly extension today and facet maneuvers.  Lateral bending and rotation also cause discomfort.  Posture is fair to  poor.  HEART:  Regular rate.  CHEST:  Clear.  ABDOMEN:  Soft and nontender.  He does appear still underweight.   ASSESSMENT:  1. Chronic low back pain related to lumbar facet arthropathy.  2. Mild cervical spondylosis.  3. Depression.   PLAN:  1. Continue Opana ER 40 mg every 12 hours with Flexeril and Elavil      also on board.  2. Continue oxycodone immediate release 10 mg q.6 hours p.r.n. #90.  3. We will discontinue Effexor and begin trial of Wellbutrin SR 150 mg      daily for 1week, then b.i.d. thereafter.  4. We will taper off Lyrica to see if this provides  him any benefit      from allergy standpoint.  Encouraged exercise and more active      living as a whole.  5. We will see him back in 3 months, nurse clinic in 1 month.      Ranelle Oyster, M.D.  Electronically Signed     ZTS/MedQ  D:  08/24/2007 09:57:44  T:  08/25/2007 00:39:51  Job #:  [id number]

## 2010-05-28 NOTE — Assessment & Plan Note (Signed)
Dylan Farley back regarding his low back pain.  Seems to be still improving  slightly.  His pain is still around 5-6/10, however, his Oswestry score  is 36% today.  He has been more active outside the home and try to  socialize a bit as well which has helped.  He does note his blood  pressures a bit up and he is concerned that might be his Percocet  leading to this.  The patient describes his pain is intermittent and  dull aching.  Pain interferes with general activity, relations with  others, enjoyment of life on a moderate level.   REVIEW OF SYSTEMS:  Notable for weakness.  Fever is also noted  previously, although not recurring.  Full review is in the written  health and history section.  Other pertinent positives are above.   SOCIAL HISTORY:  The patient is married, lives with his wife.   PHYSICAL EXAMINATION:  VITAL SIGNS:  Blood pressure is 138/87, pulse 85,  respiratory rate 18, sating 99% on room air, and weight stable.  GENERAL:  The patient is pleasant, alert, and oriented x3.  Affect is  bright and generally appropriate.  The patient continues to have some  extension and facet pain today.  He able to bend fairly freely and twist  with minimal pain today.  HEART:  Regular.  CHEST:  Clear.  ABDOMEN:  Soft and nontender.  NEUROLOGIC:  Cognitively, he is appropriate.   ASSESSMENT:  1. 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  2. Cervical spondylosis.  3. Questionable reactive depression.  4. Hypertension.   PLAN:  1. We will change his Percocet to Opana IR 5 mg 1 q.6 h. p.r.n., #90.  2. Maintain Lyrica 150 mg t.i.d.  3. Opana ER 40 mg q.12 h., #60.  4. Recommend following up with his PCP if blood pressure persistently      is elevated.  I doubt it is from his Percocet.  5. We will see him back in 3 months and 1 month with nursing.      Ranelle Oyster, M.D.  Electronically Signed     ZTS/MedQ  D:  06/27/2008 09:37:22   T:  06/28/2008 00:26:31  Job #:  [id number]

## 2010-05-28 NOTE — Assessment & Plan Note (Signed)
Dylan Farley is back regarding his low back pain.  He rates his pain a 5/10 to  6/10.  He describes it as dull, stabbing, tingling, and aching at times.  Patient interferes with his general activity, relations with others,  enjoyment of life on a moderate level.  He has had some occasional pain  into the legs which is new.  He notes is sometimes with standing for  prolonged periods of time and sometimes in the supine position.  He  reports no numbness or weakness per se of the legs.  He states that he  can walk about an hour without stopping.  He does his back and lower  extremity exercises daily.   REVIEW OF SYSTEMS:  The patient reports occasional constipation, weight  loss, fever, chills, night sweats at times.  Other pertinent positives  listed above and full review is in the written health history section of  the chart.   SOCIAL HISTORY:  Patient is married and lives with his wife.   PHYSICAL EXAM:  Blood pressure is 111/79.  Pulse is 87.  Respiratory  rate 18.  He is satting 99% on room air.  Patient is pleasant, alert and  oriented x3.  Affect is bright and appropriate.  Gait is stable.  He had  fair lumbar flexion, about 60 to 70 degrees with really minimal back  pain.  He has some tightness in the hamstring muscles.  Extension was 15  to 20 degrees with some pain in the lumbar facets.  Strength was 5/5 in  both legs with 2+ reflexes and normal sensation today.  HEART:  Regular.  CHEST:  Clear.  ABDOMEN:  Soft and nontender.   ASSESSMENT:  1. Chronic low back pain consistent with lumbar facet disease.  2. 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  3. History of left heel spur/plantar fasciitis.   PLAN:  1. Continue back brace and regular stretching.  2. Refilled patient's Kadian 50 mg every 12 hours, #60, and Vicodin      10/650 one every 6 hours p.r.n.  Also, refilled Flexeril 10 mg,      #60, today.  3. Maintain Lyrica.  4. I will see him back in about 3 months' time with nurse  clinic      followup in 1 month.      Ranelle Oyster, M.D.  Electronically Signed     ZTS/MedQ  D:  11/16/2006 15:00:37  T:  11/17/2006 11:48:07  Job #:  [id number]

## 2010-05-28 NOTE — Assessment & Plan Note (Signed)
Dylan Farley is back complaining of chronic low back pain.  He went for a lumbar  5 sacral 1 translaminar injection by Dr. Wynn Banker on January 03, 2008.  He experienced no benefit with the injection.  We resumed Lyrica at last  visit up to 75 3 times a day.  He does not know if that helped as well.  No other changes were made.  Apparently, he saw a psychologist for  disability evaluation the other day.  The patient rates his pain as 5-  6/10.  Pain is dull, aching.  He has really limited any activities.  He  perceives any activity as causing him problems with his back.  He has  pain with bending, sitting, prolonged standing.  Pain improves with rest  and medications.   REVIEW OF SYSTEMS:  Notable for confusion.  He notes that he is  sometimes losing grip on objects.  He sometimes is forgetful as well.  Other pertinent positives are above and full review is in the written  health and history section.   SOCIAL HISTORY:  The patient is married.  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   PHYSICAL EXAMINATION:  VITAL SIGNS:  Blood pressure is 140/81, pulse is  81, respiratory rate 18, he is sating 98% on room air.  GENERAL:  The patient is pleasant, alert, oriented x3.  He has pain with  bending and extending today.  NEUROLOGIC:  No focal motor weaknesses appreciated.  Sensory exam is  normal.  HEART:  Regular.  CHEST:  Clear.  ABDOMEN:  Soft, nontender.  He remains very flat.  It seems to be  increasingly so over the last several months that we have been meeting.   ASSESSMENT:  1. Chronic low back pain related to lumbar spondylosis with facet      arthropathy and degenerative disk disease.  2. Cervical spondylosis.  3. Depression.   PLAN:  1. I feel that his pain is more and more becoming central in nature      and related to his depression.  He has really no purpose or goals      of life at this point.  He has become increasingly reclusive in my      opinion over the  last months to years.  I would like to send him to      Dr. Leonides Cave for psychological evaluation and treatment.  I offered      to speak with his wife as well and get another opinion regarding      his mood and pain.  I told him today that it was fine that despite      anything we try to do for his pain that his pain does not improve.  2. We will increase Lyrica to 150 mg t.i.d.  3. Refilled OPANA ER 40 mg q.12 h. #60 and oxycodone 5 mg #180 today.  4. I will see him back in about 2 months with nurse clinic followup in      1 months' time.      Ranelle Oyster, M.D.  Electronically Signed     ZTS/MedQ  D:  02/04/2008 15:33:54  T:  02/05/2008 16:10:96  Job #:  [id number]

## 2010-05-28 NOTE — Procedures (Signed)
Dylan Farley, Dylan Farley               ACCOUNT NO.:  [id number]   MEDICAL RECORD NO.:  [id number]           PATIENT TYPE:   LOCATION:                                 FACILITY:   PHYSICIAN:  Erick Colace, M.D.DATE OF BIRTH:  1955-01-25   DATE OF PROCEDURE:  DATE OF DISCHARGE:                               OPERATIVE REPORT   PROCEDURE:  This is a right paramedian L5-S1 translaminar lumbar  epidural steroid injection under fluoroscopic guidance.   INDICATIONS:  Right greater than left-sided chronic low back pain with  pain only partially responsive to medication management including  narcotic analgesics, has a broad-based disk bulge L5-S1.  Pain  interferes self-care and mobility, interferes with prolonged sitting or  standing.  Oswestry disability index score 44% on December 31, 2007.   Informed consent was obtained after describing risks and benefits of the  procedure with the patient.  These include bleeding, bruising,  infection, as well as temporary or permanent paralysis.  He elected to  proceed and has given written consent.  The patient placed prone on the  fluoroscopy table.  Betadine prep, sterile draped.  A 25-gauge inch and  half needle was used to anesthetize the skin and subcutaneous tissue, 1%  lidocaine x2 mL.  Then, an 18-gauge 8-cm Hustead needle was inserted  under fluoroscopic guidance targeting L5-S1 interlaminar space, right  paramedian approach.  AP and lateral images utilized loss-of-resistance  technique employed under lateral imaging.  Positive loss was obtained  with 50:50 saline-air mixture.  Then, Omnipaque 180 under live fluoro  demonstrated good epidural spread x 1.5 mL.  Then, solution containing 2  mL of 40 mg/mL Depo-Medrol and 2 mL of 1% MPF lidocaine were injected.  The patient tolerated the procedure well.  Pre and post injection vitals  stable.  Post injection instructions given.      Erick Colace, M.D.  Electronically  Signed     AEK/MEDQ  D:  01/03/2008 10:44:10  T:  01/03/2008 23:56:25  Job:  [id number]   cc:   Angus G. Renard Matter, MD  Fax: [phone]

## 2010-05-31 NOTE — Assessment & Plan Note (Signed)
Dylan Farley is back regarding his low back and leg pain.  He did not notice much  difference with the Kadian, but never called me increasing the dose.  He  liked the Lidoderm patches a great deal and found these effective.  He is  only using them on a p.r.n. basis.  He rates his pain at a 3-8/10 and  usually 5-6/10 on average.  The pain is often worse in the morning when he  arises.  The pain is in the low back from the right mid buttock down to the  foot.  He is refrained from work per my recommendation since last visit.  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   REVIEW OF SYSTEMS:  The patient denies any new problems from cardiovascular,  respiratory, neurologic or psychiatric areas.  He does report some  occasional nausea, but denies vomiting, heartburn, incontinence of bowel or  bladder and problems with urination.  He denies swelling or breakdown.  He  has had some fever, chills and weight loss.   PHYSICAL EXAMINATION:  The blood pressure is 120/80, pulse 79, respiratory  rate 18 and saturating 99% on room air.  The patient walks with fairly  normal gait, which is slightly upright in the lumbar region.  His affect is  bright.  Appearance is generally normal.  He had fair range of motion today  with 60 degrees of flexion and extension to about 20 degrees with pain and  positive facet maneuvers today.  The PSIS areas were tender.  Straight leg  raising seemed to produce symptoms, as well as the seated slouch test.  The  piriformis testing upon palpation was positive today and reproduced symptoms  with spastic piriformis muscle noted.  Motor and sensory exam appeared to be  within normal limits in both legs.  His ankle reflex was slightly diminished  today.  Sensory exam was intact.   ASSESSMENT:  1. Ongoing low back pain which I feel is mediated by facet and discogenic     components, although the patient has not been responsive to infections.  2. Right leg pain  which could be radiculopathy, but keeps representing as a     possible piriformis-type syndrome.   PLAN:  1. I would like to restart him on the Kadian at 20 mg q.12h.  He will begin     the medicine at 20 mg daily for two days and then go to a q.12h. dose.  2. Will continue with the Vicodin 10/660 mg for breakthrough pain.  3. Will use amitriptyline 10 mg q.h.s.  May repeat x 1 for sleep.  4. Lidoderm patches will be used daily instead of on a p.r.n. basis as he     has had good results with these.  5. After informed consent, we injected the right piriformis muscle today     with 3 mL of 1% lidocaine and 40 mg of Kenalog.  Will observe for     response.  6. I will have the patient stay out of work until he sees you back in one     month's time.      Ranelle Oyster, M.D.   ZTS/MedQ  D:  08/25/2003 12:17:54  T:  08/25/2003 16:27:40  Job #:  [id number]   cc:   Angus G. Renard Matter, M.D.  [address]  [place]  [place] [address]  Fax: [phone]

## 2010-05-31 NOTE — Assessment & Plan Note (Signed)
REFERRING PHYSICIAN:  Angus G. Renard Matter, M.D.   Dylan Farley is here regarding his low back pain.  He had fair results with  trigger point injection in the quadratus lumborum area.  We discussed the  reaction to the facet blocks and RF ablation procedures and he had really  only good success from the second set of facet blocks but the RF was not  helpful.  He seems to be doing better with the Fentanyl patch 25 mcg q.72h.  but feels the third day, pain returns and prohibits him from going to work.  He is reporting to work about 80% of the time.  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  Today he rates his pain from a 5 to 7/10.  It is most  predominantly in the low back area.  Occasionally has pain into the legs but  this is a minor component to his picture.  The worst pain is in the low back  region which really does not radiation.  The pain is worse with walking or  sitting for a long period of time, as well as with working.  He gets better  with rest, ice, therapy and medication.  Using two to three Lorcet 10s a day  as needed for breakthrough symptoms.   REVIEW OF SYMPTOMS:  The patient denies any chest pain, shortness of breath,  cold or flu symptoms and no weakness, numbness, spasms, stroke, vertigo or  problems with vision.  He has occasional headaches.  He denies nausea,  vomiting, reflux, diarrhea, constipation, bladder incontinence.  He has had  no weight gain.  He has had a small weight loss and occasional fever.   PHYSICAL EXAMINATION:  VITAL SIGNS:  Blood pressure 117/67, pulse 82, he is  saturating 98% on room air.   The patient walks with fairly normal gait.  Strength is 5/5 in lower  extremities.  Reflexes are 2+.  Straight leg testing was negative.  He had  less palpable pain along the iliac crest and associated musculature today.  Facets were not painful with palpation.  He was able to flex to about 90  degrees with minimal pain.  With extension and facet maneuvers he had  significant pain.  Facet maneuvers seem to provoke the most.  Lateral  bending causes comfort as well.   ASSESSMENT:  1. Low back pain which remains multifactorial.  He has less myofascial     component today.  Facets seem to be the most problematic once again.  2. 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   PLAN:  1. I would like to send the patient back to Dr. Stevphen Rochester for repeat     radiofrequency ablation procedures.  We could try these bilaterally at L3-     L4, L4-L5, L5-S1 per Dr. Kerry Kass discretion.  2. Will increase the frequency of the Duragesic patch to q.48h.  Dispensed     15 patches today.  3. Refilled Lorcet 10/650 #60 for spasms on a p.r.n. basis q.8h.  4. I will see the patient back pending his RF ablation completion.      Ranelle Oyster, M.D.   ZTS/MedQ  D:  05/01/2003 13:51:21  T:  05/01/2003 18:38:27  Job #:  [id number]   cc:   Angus G. Renard Matter, M.D.  [address]  [place]  [place] [address]  Fax: [phone]

## 2010-05-31 NOTE — Assessment & Plan Note (Signed)
MEDICAL RECORD NUMBER:  [id number].   Dylan Farley is back regarding his back and leg pain. He had some return of his  right leg pain since our last visit. Back pain still is sitting about the  same level at a 5 to 6 out of 10. Pain is worse with sitting and activity  and improves with rest and medications. He asks if there is anything else we  can do for his pain. He is on Kadian 40 mg q.12h., Vicodin 10/660 one q.8h.  p.r.n., Elavil 10 to 20 mg q.h.s. He feels that the Gabitril 2 mg t.i.d.  which he really takes at 2 mg in the morning and a 4 mg night time schedule  seems to help some of the nerve pain but not completely. He does feel like  he is sleeping better.   SOCIAL HISTORY:  The patient is married. Considering trying for long term  disability.   REVIEW OF SYSTEMS:  The patient reports weakness, fever, chills, weight  loss. No GI or cardiorespiratory symptoms. Denies any GI complaints  currently. He is no having problems tolerating the Kadian.   PHYSICAL EXAMINATION:  VITAL SIGNS:  Blood pressure is 128/81, pulse 78,  respiratory rate 16. He is saturating 100% on room air.  NEUROLOGICAL:  The patient walks with a fairly normal gait. Affect is bright  and appropriate, and appearance is well kept. He appears to have lost some  weight. Back range of motion is similar to prior examination. He has pain  with facet maneuvers, left more so than right today. Facets are tender with  palpation. Straight leg raise testing was negative. Piriformis testing  revealed more tenderness once again today with equivocal piriformis  maneuver. Motor and sensory exams were intact for upper and lower  extremities today. Mental status was appropriate.  HEART:  Regular rate and rhythm.  LUNGS:  Clear.   ASSESSMENT:  1.  Facet arthropathy of the lumbar spine with a degenerative disk disease      component.  2.  Right leg pain consistent with piriformis syndrome.   PLAN:  1.  We will send the patient for  IDD therapies to work on lumbar spine      positioning and realignment. I have had some good success with this in a      similar patient. The key will be postural and musculoskeletal      maintanence after the manipulation.  2.  Continue Elavil 10 to 20 mg q.h.s. for sleep.  3.  Will increase Kadian to 50 mg q.12h.; #60 were dispensed today. Also      will stay with Vicodin 10/660 one q.8h. p.r.n. #75.  4.  The patient will continue with stimulator for his low back.  5.  I prescribed Relafen 500 mg q.d. for its anti-inflammatory effects.  6.  The patient will continue with Lidoderm patches.  7.  After informed consent we injected the right piriformis area near the      sciatic nerve with 40mg  kenalog in 3cc of 1% lidocaine.  Aspiration and      sterile technique was used.  No complications were experienced. Post-      injection instructions were given.  8.  I will see the patient back in about a month's time.      [name]  ZTS/MedQ  D:  02/02/2004 13:03:16  T:  02/02/2004 15:25:01  Job #:  [id number]

## 2010-05-31 NOTE — Assessment & Plan Note (Signed)
HISTORY OF PRESENT ILLNESS:  Dylan Farley is back regarding his low back and leg  pain. He had a series of injections including an epidural at L5-S1 as well  as facet blocks and radiofrequency ablation. He stated the only relief he  got was from the second set of facet blocks. The radiofrequency ablation  provided no relief. He rates his pain today at a 6/10, varying to 2-7/10 on  average. The pain is mostly on the right side although he indicates some  left leg pain as well. Pain is worse when he sits and bends. He denies any  numbness in the feet or other extremities. The patient states that the  symptoms improve with rest, heat, ice, and medication. He is only using  Lorcet p.r.n. for pain. The patient continues with his home exercise program  as outlined previously by therapy. 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   REVIEW OF SYSTEMS:  The patient denies any chest pain, shortness of breath,  nausea, vomiting, diarrhea. He does have occasional constipation. He has had  some fever and chills lately. He does report headaches. He denies any  anxiety, depression, blurred vision, or poor sleep. Does report some leg  weakness.   PHYSICAL EXAMINATION:  The patient's blood pressure is 123/78, pulse 64,  respiratory rate 16, saturating 99% on room air. The patient walks with a  normal gait, has normal affect on presentation. On examination of the low  back, he has some elevation of the right hemipelvis once again today. He had  normal spine positioning overall, however, in respect to the elevated  pelvis. He had normal lumbar flexion, extension, rotation, and lateral  bending. He had minimal pain with palpation of the PSIS areas. The lumbar  facets were nontender today. He did have pain over the right piriformis  region which reproduced his symptoms. The left piriformis area was  nontender. Greater trochanter regions were normal. Motor exam was 5/5 in  both legs. Sensory exam was intact.  Reflexes were 2+. Straight leg raise was  negative. Patrick's test was negative. Piriformis test was negative today.  Did note notable spasm of the piriformis muscle today.   ASSESSMENT:  1. Low back pain which appears to be at least partly facet in nature. He has     responded from the standpoint back pain to the facet infections but     continues to have leg pain.  2. Possible piriformis syndrome involving the right leg. There is definitely     spasm of the right piriformis on exam.   PLAN:  1. After informed consent, we injected the right piriformis muscle near     insertion today at the greater trochanter with 3 cc of 1% lidocaine and     60 mg of Kenalog. The patient tolerated this well. He was asked to place     ice over the area as able.  2. Place him on scheduled Skelaxin 800 mg q.i.d. for one week then p.r.n.  3. He can use Lorcet for breakthrough pain 10/650.  4. He will continue with a home exercise program, focusing on the right     pelvis stretching and range of motion.  5. I will see him back in approximately four to six weeks' time.      Ranelle Oyster, M.D.   ZTS/MedQ  D:  03/03/2003 10:33:32  T:  03/03/2003 11:00:53  Job #:  [id number]   cc:   Angus G. Renard Matter, M.D.  [address]  [place]  [place] [address]  Fax: [phone]

## 2010-05-31 NOTE — Assessment & Plan Note (Signed)
MEDICAL RECORD NUMBER:  Is #[id number].   Dylan Farley is here in followup of his low back and right leg pain.  He was  unable to tolerate the Topamax initially due to problems with reflux.  We  switched him to Gabitril with good results.  He did not take the Gabitril as  written and is taking 6 mg all at bedtime.  He recently had some reflux at  bedtime once again at the higher dose.  He does feel that the medication is  helping with his leg pain.  He has had very little sciatica.  Low back  remains a problem, however, at times.  The stimulator generally is of  benefit.  His pain averages at a 5-6 out of 10.  The pain is worse with  working and bending and squatting and then improves with his medications and  rest generally.   He is using Kadian 40 mg twice a day as well as Vicodin 10/660 one q.8h.  p.r.n.  Elavil has helped him sleep at 10-20 mg q.h.s.   SOCIAL HISTORY:  The patient remains out of work.  He still does not feel  that he can tolerate the demands of the job at this point.   REVIEW OF SYSTEMS:  The patient reports occasional nausea, constipation,  weight loss, fever, problems with spasms, weakness, numbness, headaches.  Other pertinent positives are listed above.   PHYSICAL EXAMINATION:  VITAL SIGNS:  Blood pressure is 122/63, pulse is 68,  respiratory rate is 18, sating 100% on room air.  GAIT:  The patient walks with a fair gait.  MENTAL STATUS:  Affect is bright and appropriate.  APPEARANCE:  Well kept.  MUSCULOSKELETAL:  He has good movement of the back today.  He had flexion to  about 65-70 degrees.  Both facet maneuvers are positive, left greater than  right.  Facet areas were tender with palpation to a certain extent.  Rotation and lateral bending caused minor pain today.  Straight leg raise  testing was equivocal.  Piriformis palpation revealed some tenderness still.  Piriformis maneuver was equivocal today.   ASSESSMENT:  1.  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  2.  Right leg pain consistent with right piriformis syndrome.   PLAN:  1.  The patient is doing better with the Gabitril.  We will keep this at 2      mg t.i.d.  2.  Maintain Elavil 10-20 mg q.h.s. for sleep.  3.  We will continue Kadian 40 mg q.12h. as well as Vicodin 10/660 one q.8h.      p.r.n., #120 and #75 dispensed respectively.  4.  The patient will maintain the stimulator unit for his low back.  5.  I would like to see ongoing stretching and exercises.  He needs to begin      to push some of his exertion at home      to see how his back holds up.  6.  I will see the patient back in about two months' time.       ZTS/MedQ  D:  12/05/2003 16:14:35  T:  12/05/2003 17:15:35  Job #:  [id number]   cc:   Angus G. Renard Matter, MD  [address]  [place]  [place] [address]  Fax: [phone]

## 2010-05-31 NOTE — Assessment & Plan Note (Signed)
Dylan Farley is back regarding his chronic low back pain.  Dylan Farley's pain  usually ranges at 5-6 out of 10.  It bothers him more with bad weather.  He is doing pretty well with Lyrica and Kadian as well as Vicodin at  current doses.  Pain is primarily in his low back with occasional pain  into the legs.  Pain interferes with general activity, relations with  others, and enjoyment of life on a moderate level.  Sleep is fair.   REVIEW OF SYSTEMS:  Patient has recently had decaying teeth and several  teeth pulled with partial plates pending.  Other pertinent positives are  listed above.   SOCIAL HISTORY:  He is without change.  He lives with his wife.   PHYSICAL EXAMINATION:  Blood pressure is 137/76, pulse is 71,  respiratory rate of 16, he is sating 100% on room air.  The patient is  pleasant, in no acute distress.  His affect is a bit flat but otherwise  appropriate.  Gait is stable.  He has pain still with lumbar extension  on facet maneuvers bilaterally.  Lumbar flexion was 60% with some  discomfort.  HEART:  Regular.  CHEST:  Clear.  ABDOMEN:  Soft, nontender.  MOTOR FUNCTION:  Is 5/5 and reflexes are normal, as was sensation.   ASSESSMENT:  1. Chronic low back pain consistent with lumbar facet disease and      sacroiliac dysfunction.  2. Issue of left heel spurs versus plantar fasciitis   PLAN:  1. Will have patient see [facility] for a lumbosacral arthrosis.  This      may help some of the patient's symptoms particularly when he is      standing or walking.  2. Refill Kadian 80 mg q. 12 hours and Vicodin 10/650 one q. 6 hours      p.r.n.   I will see the patient back in 3 months and the nurse clinic will see  him back in one month's time.      Ranelle Oyster, M.D.  Electronically Signed     ZTS/MedQ  D:  05/08/2006 13:29:33  T:  05/08/2006 14:13:44  Job #:  [id number]

## 2010-05-31 NOTE — Assessment & Plan Note (Signed)
Dylan Farley is back regarding his chronic low back pain.  He has been fair to  stable since I last saw him.  Pain is radiating at a 5 to 6/10 level.  He  notes that his back is much stiffer and more painful in the morning when he  awakes.  Pain remains in the low back and sometimes in the right buttock  areas.  He feels that the Kadian was much better from a continuous control  standpoint that the MS Contin is.  He is taking MS Contin 60 mg q.12h.  He  uses hydrocodone 10/650 one q.6h. p.r.n.  Lyrica is on board at 150 mg  b.i.d. and he uses Lidoderm patches with good results.   Pain interferes with general activity, relations with others and enjoyment  of life on a moderate level.  Pain worsens with bending.  He has a new puppy  he has been walking and he can walk 30 to 40 minutes without having to stop.   REVIEW OF SYSTEMS:  Patient reports some fever and chills.  He has had some  weight loss but this is not new.  Occasional abdominal discomfort and some  swelling in the fingers.  Otherwise, pertinent positives are listed above  and full review is in the health and history section of the chart.   SOCIAL HISTORY:  Patient is living with his wife and other pertinent  histories are noted above.   PHYSICAL EXAMINATION:  GENERAL APPEARANCE:  Patient is pleasant, in no acute  distress.  He is alert and oriented x3.  Affect is bright and appropriate.  VITAL SIGNS:  Blood pressure is 116/65, pulse 59, respiratory rate 16, he is  sating 99% on room air.  LUNGS:  Clear.  CARDIOVASCULAR:  Regular rate and rhythm.  ABDOMEN:  Soft and nontender.  SKIN:  Intact.  NEUROLOGIC:  Muscle function is 5/5 in all four extremities.  Patient had  some pain with extension and facet maneuvers bilaterally.  Flexion caused  some discomfort but he is able to touch his toes.  There was minimal pain  with palpation near the piriformis area on the right side as well as the  lumbar facets and paraspinals.  Straight leg  testing was normal. Reflexes  were 2+ and sensation was normal.   ASSESSMENT:  1.  Facet arthropathy of lumbar spine with degenerative disc component.  2.  Right-sided piriformis syndrome.   PLAN:  1.  Switch back to Kadian 60 mg q.12h. #60.  2.  I refilled the hydrocodone 10/650 one q.6h. p.r.n. #120.  3.  Maintain Lyrica and Lidoderm at current doses.  4.  Add Flexeril 10 mg to 20 mg nightly for sleep and to assist in morning      stiffness.  5.  Encourage ongoing exercise and activity.  6.  I will see him back in about one month's time.      Ranelle Oyster, M.D.  Electronically Signed     ZTS/MedQ  D:  04/07/2005 11:36:19  T:  04/08/2005 17:13:58  Job #:  [id number]   cc:   Angus G. Renard Matter, MD  Fax: [phone]

## 2010-05-31 NOTE — Op Note (Signed)
NAME:  Dylan Farley, Dylan Farley                         ACCOUNT NO.:  [id number]   MEDICAL RECORD NO.:  [id number]                   PATIENT TYPE:  AMB   LOCATION:  DAY                                  FACILITY:  [facility]   PHYSICIAN:  Gerrit Friends. Rourk, M.D.               DATE OF BIRTH:  01-31-1955   DATE OF PROCEDURE:  08/20/2001  DATE OF DISCHARGE:                                 OPERATIVE REPORT   PROCEDURE:  Screening colonoscopy.   ENDOSCOPIST:  Gerrit Friends. Rourk, M.D.   INDICATIONS FOR PROCEDURE:  The patient is a 55 year old gentleman with a  recent 10-pound weight loss, fatigue, back and right lower quadrant  abdominal pain referred by Dr. Renard Matter for colonoscopy.  He has been  evaluated extensively over the past 2 years. He has a history of  nephrolithiasis and has seen Dr. Rito Ehrlich previously, seen Dr. Tressie Stalker down in [place], and he has degenerative changes in his LS-spine  on MRI.  Colonoscopy is now being done to further evaluate his symptoms.  The approach has been discussed with the patient at length.  The potential  risks, benefits, and alternatives have been reviewed; questions answered and  he is agreeable.  I feel that he is low risk for conscious sedation in the  way of Versed and Demerol.   DESCRIPTION OF PROCEDURE:  O2 saturation, blood pressure, pulse and  respirations were monitored throughout the entire procedure.   CONSCIOUS SEDATION:  Demerol 150 mg IV, Versed 6 mg IV in divided doses.   INSTRUMENT:  Olympus video chip adult colonoscope and pediatric colonoscope.   FINDINGS:  Digital rectal examination revealed no abnormalities.   ENDOSCOPIC FINDINGS:  The prep was adequate.   RECTUM:  Examination of the rectal mucosa including the retroflex view of  the anal verge revealed no abnormalities aside from internal hemorrhoids.   COLON:  The colonic mucosa was surveyed from the rectosigmoid junction to  the sigmoid colon.  There was a hairpin turn which I  could not overcome with  the adult scope.  I withdrew it and obtained a pediatric colonoscope and was  able to get around this tight turn.   I advanced the scope to the cecum, ileocecal valve, and appendiceal orifice.  The colonic mucosa all the way to the cecum appeared normal.  The cecum,  ileocecal valve and appendiceal orifice were photographed for the record.  The terminal ileum was then invaded to 10-15 cm.  This segment of the GI  tract was well seen and appeared normal.  From this level the scope was  slowly withdrawn.  All previously mentioned mucosal surfaces were again  seen,  and no abnormalities were observed.  The patient tolerated the  procedure well and was reacted in endoscopy.   IMPRESSION:  1. Internal hemorrhoids otherwise normal rectum.  2. Normal colon.  3. Normal terminal ileum.  No endoscopic explanation for  the patient's     symptoms.   RECOMMENDATIONS:  Abdominal and pelvic CT with IV oral contrast to complete  the GI evaluation.  This will be done next week.  Further recommendations to  follow.                                               Gerrit Friends. Rourk, M.D.    RMR/MEDQ  D:  08/20/2001  T:  08/21/2001  Job:  [id number]   cc:   Angus G. Renard Matter, M.D.

## 2010-05-31 NOTE — Procedures (Signed)
NAME:  Dylan Farley, Dylan Farley NO.:  [id number]   MEDICAL RECORD NO.:  [id number]          PATIENT TYPE:  REC   LOCATION:  [facility]                          FACILITY:  [facility]   PHYSICIAN:  Erick Colace, M.D.DATE OF BIRTH:  December 11, 1955   DATE OF PROCEDURE:  06/23/2005  DATE OF DISCHARGE:                                 OPERATIVE REPORT   PROCEDURE:  Bilateral sacroiliac joint injection under fluoroscopic  guidance.   INDICATIONS:  SI joint dysfunction.  Has had some previous improvements  after sacroiliac injections.  His pain is only partially responsive to  narcotic analgesic and other conservative care.   Informed consent was obtained after describing the risks and benefits of the  procedure to the patient.  These include bleeding, bruising, infection, loss  of bowel and bladder function, temporary or permanent paralysis.  He elected  to proceed and has given written consent.   The patient was placed prone on the fluoroscopy table, Betadine prep and  sterile drape.  A 25-gauge 1-1/2 inch needle was used to infiltrate the skin  and subcu tissues with 2 mL of 1% lidocaine at each site.  Then a 25-gauge 2-  1/2 inch spinal needle was inserted first in the left SI joint under AP and  lateral imaging, followed by Omnipaque 180 x0.5 mL, demonstrating a good  joint arthrogram, followed by injection of 1 mL of a solution containing 1  mL of 2% lidocaine MPF plus 1 mL of 40 mg/mL Depo-Medrol.  The same  procedure was repeated on the right side using the same technique, same  medications and equipment.  The patient tolerated the procedure well.  Preinjection and postinjection vitals stable.  Postinjection instructions  given.  He will follow up with Dr. Riley Kill to assess efficacy.      Erick Colace, M.D.  Electronically Signed     AEK/MEDQ  D:  06/23/2005 17:46:18  T:  06/24/2005 06:15:39  Job:  [id number]

## 2010-05-31 NOTE — Assessment & Plan Note (Signed)
MEDICAL RECORD NUMBER:  [id number]   Mr. Condrey is here in followup of his low back and leg pain. He has had good  results with the IDD therapy. He still has some generalized low back pain.  He has minimal pain down the legs. Pain ranges from a 4 to 8/10 and now is a  6/10. He is sleeping better. He uses 2-3 Vicodin a day. He is doing some  modest activities at home. He is still keeping his lifting to less than 50  pounds. The pain is worse with activity and bending as well as extension of  the back and walking. It improves with ice and his medications.   SOCIAL HISTORY:  The patient continues to live with his wife. He is applying  for long-term disability.   PHYSICAL EXAMINATION:  VITAL SIGNS:  Blood pressure 131/74, pulse 62,  respiratory rate 16, he is sating 100% on room air.  NEUROLOGIC:  His affect is fairly bright and appropriate. He is slight of  build. His back range of motion essentially stable at 60-70 degrees of  forward flexion. Extension still causes some discomfort but to a lesser  extent at 15-20 degrees. Facets remain tender.  Straight leg raise testing  is negative. Piriformis testing palpation was negative. Motor sensory exams  are within normal limits.  HEART:  Regular rate and rhythm.  LUNGS:  Clear.   ASSESSMENT:  1.  Facet arthropathy of the lumbar spine with degenerative disk component.  2.  Right sided piriformis syndrome.   PLAN:  1.  Continue home exercise program.  2.  Will keep Kadian at 50 mg q.12 hours.  I have him a prescription for 60      of these today. Will stay with Elavil 10-20 mg at bedtime and Gabitril 4      mg q.i.d. up to 8 mg q.i.d.  3.  Will refill Vicodin 10/660 as needed for breakthrough pain.  4.  Change Relafen to 500 mg q.d. p.r.n.   I will see the patient back in three months time. Will followup in the  meantime in the RN clinic.      ZTS/MedQ  D:  04/24/2004 09:59:52  T:  04/24/2004 10:11:12  Job #:  [id number]

## 2010-05-31 NOTE — Assessment & Plan Note (Signed)
Dylan Farley is back regarding his low back pain.  The pain is generally stable at  a 5-6/10, although he is having a tough day today.  He was doing some  working out in the yard over the weekend which he feels may have exacerbated  some of his symptoms.  We switched him back to Kadian 60 mg q.12h. last  visit.  He does notice a huge change from the generic morphine.  Continues  to take hydrocodone 10/650 for breakthrough pain, although he seems to be  using less of this so perhaps this is a good sign.  Sleep is fair.  Mood is  fair, though he remains a bit flat.  He is still waiting for his disability  to be approved and has been turned down twice, apparently, already.  He  continues Lyrica 150 mg b.i.d. and uses Lidoderm patches for local relief of  his back pain with good results.   REVIEW OF SYSTEMS:  Patient denies any new neurological or psychiatric  issues.  Does report recent fever, some weight loss.  Otherwise, review is  negative.  Full review as in the health and history section of the chart.   SOCIAL HISTORY:  Patient is married, living with his wife.   PHYSICAL EXAMINATION:  VITAL SIGNS:  Blood pressure 115/70, pulse is 75,  respiratory rate 16, saturating 99% on room air.  GENERAL:  Patient is bright and appropriate for the most part.  He seems to  be in some pain, particularly with sitting and standing.  He is restricted  with extension today.  Had positive facet maneuvers bilaterally.  HEART:  Regular rate and rhythm.  LUNGS:  Clear.  ABDOMEN:  Soft, nontender.  NEUROLOGIC:  Motor function was stable.  Straight leg raise testing was  negative.  Sensory is within normal limits.   ASSESSMENT:  1.  Facet arthropathy of lumbar spine with degenerative disk component.  2.  Right-sided piriformis syndrome.   PLAN:  1.  Increase Kadian to 80 mg q.12h.  2.  Refill hydrocodone when needed.  3.  Maintain Lyrica and Lidoderm patch at the same doses.  4.  Consider follow-up facet  injections this summer.  With the persistence      of his pain I would like to check an MRI today to see if there has been      any change since the scan we had done about three years ago.  5.  I will see the patient back in about one month's time.      Ranelle Oyster, M.D.  Electronically Signed     ZTS/MedQ  D:  05/05/2005 16:07:06  T:  05/06/2005 15:55:30  Job #:  [id number]   cc:   Angus G. Renard Matter, MD  Fax: [phone]

## 2010-05-31 NOTE — Assessment & Plan Note (Signed)
Dylan Farley is back regarding his low back pain.  He does report a recent history  of increased left heel pain that is worse with weightbearing.  It generally  will ease up a bit the more he is on the foot.  The pain is worse in the  early morning and night hours.  He has really done nothing new for the heel.  He has purchased some new heel inserts which is not wearing regularly at  this point.  The patient rates his pain as a 5-6/10.  His low back pain has  generally been stable.  He reports the pain as sharp, intermittent and  stabbing.  Pain interferes with general activity, relationship with others,  enjoyment of life on a moderate level.   REVIEW OF SYSTEMS:  Notable for occasional fever, chills, night sweats,  constipation.  Other pertinent positives are listed above and a full review  is in the Health and History section of the chart.   SOCIAL HISTORY:  The patient is married, living with his wife.  He has been  looking at reapplying for disability.   PHYSICAL EXAM:  Blood pressure is 145/49, pulse is 76, respiratory rate 16,  he is sating 100% on room air.  The patient is generally pleasant, in no  acute distress.  He alert and oriented x3.  HEART:  Regular rate and rhythm.  LUNGS:  Clear.  ABDOMEN:  Soft, nontender.  LEFT HEEL:  Painful to palpation.  Plantar fascia itself did not appear  overly tender.  He had some problems with weightbearing.  I looked at his  left heel on his shoe and the shoe itself is fairly worn down.  LOW BACK:  Stable with pain in the lumbar spine with deep palpation.  FLEXION:  Caused some discomfort.  more lumbar spine pain was provoked with  extension and facet maneuvers.  SACRAL ILIAC MANEUVERS:  Appear to be provocative today as well.  MOTOR FUNCTION:  Was 5/5.  SENSORY FUNCTION:  Intact in all 4 extremities.   ASSESSMENT:  1. Chronic low back pain consistent with facet disease with possible      sacroiliac component.  2. Left heel pain possibly  due to calcaneal spurs versus plantar      fasciitis.   PLAN:  1. Recommend appropriate shoe wear and heel support with good insole and      medial arch.  2. Work on daily stretches to the left foot upon arising.  3. Two week trial of Celebrex.  4. Continue Kadian 80 mg q.12h. and Vicodin 10/650 one q.6h. p.r.n., #120.  5. Will have the patient follow up with me in approximately 3 months'      time.  He will see the nurse clinic in 1 month.  We did discuss      potential vocational rehab referral and he will think about this and      discuss with his wife.  I think he could participate in sedentary work.      Ranelle Oyster, M.D.  Electronically Signed     ZTS/MedQ  D:  10/31/2005 12:24:18  T:  11/02/2005 09:39:45  Job #:  [id number]

## 2010-05-31 NOTE — Assessment & Plan Note (Signed)
MEDICAL RECORD NUMBER:  [id number].   Dylan Farley is back regarding his low back and leg pain. He has done better with  the Kadian at 40 mg q.12h. He has experienced no untoward side effects. Pain  ranges from a 3 to 6/10 average. Worse when he stands for a while as well as  occasional sitting and working. He feels as if his back is weak. Vicodin is  used 2 to 3 times a day for breakthrough pain. Amitriptyline has helped his  sleep as well as some of the paresthesias in his leg. He had good results  with the piriformis injection. He had 8 days without any pain. He is only  having spotty pain at this point every third day approximately. He uses the  Lidoderm patches successfully as well. He continues to stay out of work.   REVIEW OF SYSTEMS:  The patient denies any new chest pain, shortness of  breath, cold, flu, wheezing, or coughing symptoms. There was noted some  weakness and occasional numbness in the legs. Sleep is still decreased but  better. He has occasional headaches. He denies nausea, vomiting, reflux, or  other GI symptoms. Denies fevers, chills, weight changes, bruising, rash.   PHYSICAL EXAMINATION:  GENERAL:  Blood pressure is 98/85, pulse 77,  respiratory rate 16, saturating 100% on room air. The patient walks with a  slight limp but this is minimal. Affect is bright and appropriate and  appears in less distress then when compared to prior visits. He is groomed  normally.  HEART:  Regular rate and rhythm.  LUNGS:  Clear.  ABDOMEN:  Soft, nontender.   No focal muscle weakness was noted in the lower extremities today.  Piriformis testing was equivocal. He did have pain on palpation of the right  piriformis muscle with reproduction of his symptoms. Low back was tender to  palpation in the lower lumbar facets. Pain was worse with extension and  facet maneuvers. He had minimal pain with hip flexion and bending to either  side.   ASSESSMENT:  1.  Low back pain related to facet  arthropathy and degenerative disk      disease.  2.  Right leg pain with radicular signs which appears to be a piriformis      syndrome.   PLAN:  1.  After informed consent, we injected the right piriformis today with 3 cc      of 1% lidocaine and 40 mg Kenalog. The patient tolerated this well.  2.  I will have the patient set up with [facility] for interferential      stimulator unit.  3.  Will continue Kadian at 40 mg q.12h. We also will continue Vicodin      10/660 one q.8h. p.r.n. for breakthrough pain.  4.  The patient has used Elavil successfully. We will maintain the dose at      10 to 20 mg q.h.s.  5.  The patient will use Lidoderm patches daily as needed.  6.  I will see the patient back in about 1 month's time. He will remain out      of work until then.      Ranelle Oyster, M.D.    ZTS/MedQ  D:  10/02/2003 10:58:53  T:  10/03/2003 08:33:52  Job #:  [id number]   cc:   Angus G. Renard Matter, M.D.  [address]  [place]  [place] [address]  Fax: [phone]

## 2010-05-31 NOTE — Procedures (Signed)
NAME:  Dylan, Farley NO.:  [id number]   MEDICAL RECORD NO.:  [id number]                   PATIENT TYPE:  REC   LOCATION:                                       FACILITY:  [facility]   PHYSICIAN:  Celene Kras, MD                     DATE OF BIRTH:  1955-08-31   DATE OF PROCEDURE:  01/31/2003  DATE OF DISCHARGE:                                 OPERATIVE REPORT   HISTORY:  The patient [facility] to the [facility] for pain management today.  I evaluated the history form, and a 14-point review of systems were reviewed  and progress to date.  Reviewed imaging.   1. Dylan Farley took himself out of work last Tuesday.  He was doing really quite     well, and had a significant exacerbation of the pain.  He did, however,     have nearly seven days of complete diminution in the pain perception     after a median branch block.  2. As we performed the procedural inventory, I really think that we need to     minimize further procedures.  I think in his best interest, it is     reasonable to go ahead and proceed with a facetal neurotomy, as this is     our best on-surgical position at this time, to decrease escalation of     narcotic-based pain medication and improve function and quality of life,     and decrease procedural intrusion.  I am mindful of steroid exposure.  3. Other life style enhancements were discussed.  He is starting to quit     smoking.  Home-based therapy is initiated, and this is very positive for     a pretty good outcome.   DISPOSITION:  He will follow up with our physiatry colleagues in followup,  and predicate contralateral side or reinforcement of RF, based on need at a  later date.  I discussed this procedure in detail with the patient.  The  risks, complications and options are fully reviewed.  This includes  bleeding, infection, nerve damage, stroke, seizure, death, unforeseen  problems commonly encountered, the potential for neuritis, no  improvement in  pain, or increase in pain.  He agrees and wishes to proceed.   PHYSICAL EXAMINATION:  Objectively diffuse paralumbar myofascial discomfort.  Pain over the PSIS.  Notable pain on extension, with Gaenslen's and  Patrick's equivocal.  He has no new neurological findings in motor, sensory  and reflexes.  Primarily myofascial pain today, with notable pain on  extension.   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   PLAN:  As outlined above.  Facet neurotomy, radiofrequency neural ablation  at L5-S1, L4-5, L3-4, L2-3, with independent needle access points, right  side most problematic side.  Consider the left side if needed at a later  date.  He has consented.   DESCRIPTION OF PROCEDURE:  The patient is taken to the fluoroscopy suite and  placed in the prone position.  The back is prepped and draped in the usual  fashion.  Using a #22 gauge 5 mm RF needle, I advanced to the facet medial  branch of L5-S1 and L4-5, L3-4, L2-3, at independent needle access points,  right side; and confirmed placement in multiple fluoroscopic positions.  I  used Isovue-200 to appropriate stimulate motor and sensory.  I reconfirmed  needle placement at multiple points during the procedure.  I then injected 1  mL of lidocaine 1% MPF at each level with a total of 40 mg of Aristocort in  a divided dose.  The lesion is performed at 70 degrees for 70 seconds with  ramp-up, right side, independent needle access points.   DISPOSITION:  He tolerated this procedure well.  No complications from our  procedure.  Discharge instructions given.  Other life style enhancements  were reviewed.  I will see him in approximately one month.                                                Celene Kras, MD    HH/MEDQ  D:  01/31/2003 09:31:34  T:  01/31/2003 10:42:20  Job:  [id number]

## 2010-05-31 NOTE — Assessment & Plan Note (Signed)
MEDICAL RECORD NUMBER:  [id number]   DATE:  July 10, 2004   The patient has been fairly stable from a pain standpoint.  His pain has  been a 4 to 5/10.  It does increase with activity.  He helped cut his aunt's  yard the other day and had increase in his pain.  He was able to do low-  intensity chores around the house.  He does do some dishes and cuts the  grass at home.  His exercise tolerance depends on how he feels on a given  day.  He remains on Acadian 50 mg b.i.d., Gabitril 8 mg t.i.d., and  hydrocodone 10/650 one q.h.s. p.r.n.  He uses 3 Lidoderm patches daily to  the back.  The pain is in the low back predominantly, though he does have  some radiating tingling pain to the legs, right more than left.  The patient  was last seen on June 1 by our nursing staff.  Additionally, the patient  takes Relafen 500 mg p.o. daily and Elavil 1 to 2 at bedtime.   The patient has received clearance for long-term disability on a preliminary  basis.  He is awaiting final official word.   REVIEW OF SYSTEMS:  The patient reports no fever, chills, weight loss.  Denies any gastrointestinal, GU, cardiorespiratory, or  neurological/psychological difficulties.   SOCIAL HISTORY:  Pertinent positives as mentioned above.   PHYSICAL EXAMINATION:  VITAL SIGNS:  Blood pressure 129/74, pulse 59,  respiratory rate 16, saturating 99% on room air.  GENERAL:  The patient is pleasant, in no acute distress.  He is alert and  oriented x 3.  Affect is bright and appropriate.  Gait is slightly straight  back but stable.  Coordination is fair.  Reflexes are 2+.  Sensation is  normal.  Straight leg testing was negative today.  Patrick's test and  piriformis testing was negative today.  Low back was painful along the  lumbar facets at L4-L5, L5-S1 but no different from prior examinations.  HEART:  Regular rate and rhythm.  LUNGS:  Clear.   ASSESSMENT:  1.  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  2.  Right-sided piriformis syndrome.   PLAN:  1.  Will increase Acadian to 60 mg q.12h.  2.  Will decrease Gabitril over the next two to three weeks' time and begin      a trial of Lyrica titrating up to 75 mg t.i.d. over three weeks' time.      Goal will be to titrate Gabitril off eventually.  3.  I filled hydrocodone 10/650 one q.8h. p.r.n. breakthrough pain, #90.  4  The patient will use Relafen 500 mg daily p.r.n. and Elavil 10 to 20 mg  q.h.s. for sleep.  1.  The patient will return for nursing visit in one month, and I will see      him back in two months' time.       ZTS/MedQ  D:  07/10/2004 13:31:03  T:  07/10/2004 14:42:47  Job #:  [id number]   cc:   Angus G. Renard Matter, MD  [address]  [place]  [place] [address]  Fax: [phone]

## 2010-05-31 NOTE — Assessment & Plan Note (Signed)
MEDICAL RECORD NUMBER:  [id number].   Dylan Farley is back regarding his chronic low back pain. He has been doing fairly  well from a pain standpoint. He feels his Lyrica has helped him sleep a lot  better, and his leg pain is less. He rates his pain at a 5/10 on average.  Pain is in the low back and into the legs. It usually worsens with bending,  sitting, and standing and with most general activity. He has tried to cut  the grass two to three weeks in a row, and he has had a lot of pain  afterwards. He states that pain interferes with general activity, relations  with others, and enjoyment of life on a moderate level. His spirits in  general are good.   REVIEW OF SYSTEMS:  The patient does report some tightness in the muscles  since being the Lyrica when getting up in the morning, but this seems to  subside. He denies any psychological problems. He denies any constitutional,  GU, GI, or cardiorespiratory complaints today.   SOCIAL HISTORY:  The patient is married and without change.   PHYSICAL EXAMINATION:  Blood pressure is 130/61, pulse 56, respiratory rate  18. He is saturating 100% on room air. The patient is pleasant in no acute  distress. He seems to gain some weight. Affect is bright and appropriate.  Gait is stable. He had some pain with extension maneuvers and facet  maneuvers in the low back. Reflexes in the lower extremities are 2+.  Sensation was normal. Motor testing was within normal limits with 5/5  strength in the lower extremity musculature. Low back was painful along with  palpation of the lower lumbar facets bilaterally, L4 through S1.  HEART:  Regular rate and rhythm.  LUNGS:  Clear.   ASSESSMENT:  1.  Facet arthropathy of the lumbar spine with a degenerative disk      component.  2.  Right sided piriformis syndrome.   PLAN:  1.  Continue Kadian at 60 mg q.12h.  2.  Will titrate Lyrica up to 150 mg t.i.d. while tapering Gabitril off.  3.  Refilled hydrocodone  10/650 #90 today as well as Lidoderm patches.  4.  I will see the patient back in three months' time. He will follow up      with our R.N. clinic  in one month.      Ranelle Oyster, M.D.  Electronically Signed     ZTS/MedQ  D:  09/04/2004 11:00:19  T:  09/04/2004 11:28:08  Job #:  [id number]

## 2010-05-31 NOTE — Assessment & Plan Note (Signed)
Friday, January 23, 2006:   Dylan Farley is back regarding his low back pain.  He has had a lot of changes  since I last saw him.  Pain ranges from a 5-6/10.  Pain interferes with  general activity, relationships with others and enjoyment of life on a  moderate level.  The pain is described as sharp, stabbing, sometimes  aching and intermittent.  The pain will fluctuate from day to day.  Sleep is fair.  He stays active with his grandchildren and does odds and  end work around the house.  He has not pursued any type of vocational  activity.   REVIEW OF SYSTEMS:  Fully listed in the Health and History Section of  her chart.   SOCIAL HISTORY:  Unchanged.   PHYSICAL EXAM:  Blood pressure is 134/78, pulse 72, respiratory rate 16,  sating 97% on room air.  The patient is pleasant, in no acute distress.  He is alert and oriented x3.  Affect is bright and appropriate.  Gait is  stable.  HEART:  Regular.  CHEST:  Clear.  ABDOMEN:  Soft, nontender.  REFLEXES:  Are 2+.  MOTOR FUNCTION:  Is 5/5.  SENSATION:  Normal.  The patient has pain still with lumbar extension and facet maneuvers on  either side.  Lumbar flexion essentially was 50-60 degrees with some  discomfort.  Mood was fair.   ASSESSMENT:  1. Chronic low back pain consistent with lumbar facet disease and      sacroiliac dysfunction.  2. History of left heel spurs versus plantar fasciitis.   PLAN:  1. Continue appropriate shoe wear.  2. Refill Kadian 80 mg q.12 hours and Vicodin 10/650 one q.6 hours      p.r.n. #120.  3. Encourage regular exercise and stretching.  I would like to see him      pursue vocational activities.  I would make a Vocational Rehab      referral for him if he chooses.  4. I will see the patient back in 3 months.  5. He will see the nurse clinic in 1 month.      Ranelle Oyster, M.D.  Electronically Signed     ZTS/MedQ  D:  01/23/2006 13:59:28  T:  01/23/2006 18:00:16  Job #:  [id number]

## 2010-05-31 NOTE — Assessment & Plan Note (Signed)
MEDICAL RECORD NUMBER:  [id number].   Dylan Farley is following up about his low back and leg pain.  The low back  component remained resistant to the repeat RF procedures.  He does not  recall significant relief even initially after these RF ablations.  He has  essentially stopped work now as he is only able to work two days a week  because of his other pain.  The pain is most prominent in the low back, but  is also now involving the legs more so again.  He is rating his pain as 6-  8/10.  He is using Lorcet 10/650 mg one to three times a day.  The pain is  worse with sitting and with lying.  He finds that it is a little better now  with walking and standing.   REVIEW OF SYSTEMS:  The patient denies any chest pain or shortness of  breath.  No acute cold, flu, wheezing, or coughing symptoms.  He does report  some weakness in the right leg, though inconsistent.  He denies numbness in  the leg.  Denies spasms in the back or lower extremities.  The patient  reports poor sleep and decreased mood.  The patient denies nausea, vomiting,  diarrhea, constipation, and urinary or bladder incontinence.  The patient  denies any fever or chills.  He does report weight loss over the last year  or two.  He states that Dr. Renard Matter attributed this to his emphysema.   PHYSICAL EXAMINATION:  On physical examination today, the patient's blood  pressure is 125/58, the pulse is 87, the respiratory rate is 14, and  saturating 99% on room air.  The patient walks with a normal gait.  Affect  is appropriate, but a bit flat.  Appearance is normal.  On examination of  the legs today, the patient had normal range of motion and strength  throughout with normal sensory exam and reflexes, all 2+.  Examination of  the low back revealed a normal lordotic curve and no left to right  asymmetries.  Paraspinal muscles were a bit taut, but only slightly painful  to touch.  Facets were minimally uncomfort.  PSI series were  uncomfortable  today.  Straight leg raising seemed to reproduce symptoms, as well as seated  slouch test.  Luisa Hart test was negative.  Facet maneuvers were negative.  Piriformis testing was negative today.   ASSESSMENT:  Degenerative disease of the spine with facet and discogenic  component.  Questionable radiculopathy as well.  The patient is a very  difficult case.  He has failed multiple injections, including epidural  injections and facet located injections, over the last year-and-a-half.  He  had initial positive responses to the facet blocks and we felt that a lot of  the back pain would be relieved with the RF ablations, but that has not been  the case.   PLAN:  1. I would like to change gears a bit and work towards a more aggressive     pain management through medication.  We will begin him on Kadian 20 mg     daily.  2. We will change him over to Vicodin 10/660 mg one q.8h. p.r.n. for     breakthrough symptoms.  3. Begin him on amitriptyline 10 mg q.h.s. p.r.n.  May repeat x 1.  4. I gave him samples of Lidoderm patches and a long-term prescription if he     tolerates the medication.  He will place the patches  on the low back     every 12 hours, particularly during the daytime hours when symptoms are     the     worse.  5. I will follow up with the patient in about one month's time.      Ranelle Oyster, M.D.   ZTS/MedQ  D:  07/11/2003 14:04:12  T:  07/11/2003 14:50:31  Job #:  [id number]   cc:   Angus G. Renard Matter, M.D.  [address]  [place]  [place] [address]  Fax: [phone]

## 2010-05-31 NOTE — Assessment & Plan Note (Signed)
MEDICAL RECORD NUMBER:  [id number].   Dylan Farley is back regarding his chronic low back pain. We switched him over to  MS Contin from the Kadian due to monetary reasons. He feels that this has  been adequate coverage. He has used Lyrica 150 mg b.i.d. as he is having to  pay out of pocket for this, and this is working reasonably well. Lidoderm  patches are providing local relief. Pain today is 5/10. He suffered from a  severe GI infection. It lasted about a week with severe nausea and vomiting  as well as diarrhea. This has since resolved. He has been a weaker and lost  some weight as a result. Sleep remains fair. He is having pain in the low  back primarily as well as in the right buttock. Pain increases with  standing, bending and sitting. Pain interferes with general activity,  relations with others and enjoyment of life on a moderate level.   REVIEW OF SYSTEMS:  The patient reports recent fever, chills, weight loss,  night sweats. Other pertinent positives listed above. Full review of systems  is in the health and history section of the chart.   PHYSICAL EXAMINATION:  VITAL SIGNS:  Blood pressure is 118/73, pulse 60,  respiratory rate 16. He is saturating 100% on room air.  GENERAL:  The patient is pleasant in no acute distress. He is alert and  oriented x3. Affect is bright and appropriate. Gait is stable. Flexion was  approximately 55 degrees. Extension was 10 to 15 degrees with pain. He had  some pain with facet maneuvers and decreased range of motion with twisting  towards the right.  HEART:  Regular rate and rhythm.  LUNGS:  Clear.  ABDOMEN:  Soft and nontender.  SKIN:  Intact.  MUSCULOSKELETAL:  Motor function was 5/5 in all tested extremities.   ASSESSMENT:  1.  Facet arthropathy of the lumbar spine with a degenerative disk      component.  2.  Right sided piriformis syndrome.   PLAN:  1.  Continue MS Contin 60 mg q.12h. I refilled the hydrocodone 10/650 one      q.6h.  p.r.n. #120.  2.  Continue Lyrica 150 mg b.i.d.  3.  I refilled Lidoderm patches two to three daily.  4.  We will see the patient back in one month in the RN clinic. I will see      him back in three months' time.      Ranelle Oyster, M.D.  Electronically Signed     ZTS/MedQ  D:  12/31/2004 14:53:46  T:  01/02/2005 08:59:44  Job #:  [id number]

## 2010-05-31 NOTE — Assessment & Plan Note (Signed)
MEDICAL RECORD NUMBER  [id number]   HISTORY OF PRESENT ILLNESS:  Dylan Farley is back regarding his back and leg pain.  He feels that the IDD therapies have helped, at least in the short term,  with the back pain. He is able to tolerating sitting and standing a bit  better, although pain is still in the level of 5 to 6 out of 10. He rates it  on an average at a 5 out of 10 today. The right-sided piriformis injection  helped for a week or two in January but that seemed to have decreased. He  feels that the Elavil and Gabitril are helpful as well but to a lessor  extent. He is on Kadian 50 mg q. 12 hours without problems. He seems to be  tolerating the Relafen fairly well.   SOCIAL HISTORY:  Remains with his wife. The patient contemplates long-term  disability.   REVIEW OF SYSTEMS:  The patient reports problems with sleep although this is  improved since he started the IDD. Reports occasional spasms, nausea,  vomiting, fever, chills and appetite decrease.   PHYSICAL EXAMINATION:  VITAL SIGNS:  Blood pressure 122/73, pulse 73,  respiratory rate 16, saturation 100% on room air.  NEUROLOGIC:  The patient walks with a slight limp but affect is a bit  brighter. Appearance is well kept. The patient is very slight of build. Back  range of motion is fair with about 60% of forward flexion, extension and  facet maneuvers caused a lot of pain today, right more so than the left.  Facets remain tender. Straight leg testing is negative. Piriformis testing  was positive to equivocal. Motor and sensory examinations were normal for  both upper and lower extremities.  HEART:  Regular rate and rhythm.  LUNGS:  Clear.   ASSESSMENT:  1.  Facet arthropathy of the lumbar spine with degenerative disk disease      component.  2.  Right-sided piriformis syndrome.   PLAN:  1.  Continue IDD therapies.  2.  Continue Elavil 10 to 20 mg at bedtime for sleep.  3.  Continue Kadian at 50 mg q. 12 hours.  4.  We will  titrate the Gabitril up to 4 mg q.i.d. initially, then up to 8      mg t.i.d.  5.  I refilled Vicodin 10/660 today for break-through pain.  6.  Encouraged agressive home exercise program for the back, once IDD      therapy is complete.  7.  I will see the patient back in about 2 months time.      ZTS/MedQ  D:  03/01/2004 12:29:05  T:  03/02/2004 14:29:37  Job #:  [id number]

## 2010-05-31 NOTE — Procedures (Signed)
NAME:  Dylan Farley, Dylan Farley                         ACCOUNT NO.:  [id number]   MEDICAL RECORD NO.:  [id number]                   PATIENT TYPE:  REC   LOCATION:  [facility]                                  FACILITY:  [facility]   PHYSICIAN:  Erick Colace, M.D.           DATE OF BIRTH:  Jul 19, 1955   DATE OF PROCEDURE:  12/26/2002  DATE OF DISCHARGE:                                 OPERATIVE REPORT   The patient returns today after I last saw him for bilateral L5  transforaminal epidural steroid injection done December 02, 2002.  He had  equivocal results as a result of this injection, perhaps has had some  reduced lower extremity pain but back pain is still a problem.  I discussed  these findings with Dr. Riley Kill and the patient will have medial branch  blocks bilateral L3-L4 and dorsal ramus blocks L5 bilaterally.   Informed consent was obtained after discussing risks and benefits of the  procedure with the patient.  These risks include bleeding, bruising,  infection, paralysis, numbness and increased pain.   The patient placed in the prone position, sterilely prepped and draped.  Skin wheel raised with 1 mL of 2% lidocaine.  First target right side  junction of sacral ala with S1 superior articular process, a 22-gauge spinal  needle was utilized and maneuvered to this point under multiple fluoroscopic  images.  Site was confirmed with 0.3 mL of Omnipaque 180.  When this was  first injected, venous uptake was noted but with needle repositioning, this  has gone and appropriate uptake was noted.  Then a solution of 2% lidocaine,  methylparaben free x0.3 mL was injected.  This same procedure was repeated  at L5 transverse process and superior articular process junction as well as  L4 transverse process superior articular process junction.  Needle  repositioning due to intravascular uptake had to be done at L5 but not at  L4.  Similarly 0.3 mL of 2% methylparaben free lidocaine was injected in  each  site.  The same procedure was repeated exactly on the left side,  starting at L3 working down to the L5 dorsal ramus.  The patient tolerated  the procedure well.  A total of 1.8 mL of 2% lidocaine were injected  methylparaben free.  A total of 2 mL of Omnipaque 180 were utilized.  Post  injection instructions given.  The patient to call back in two days, report  on results.  If positive, which would be greater than two thirds reduction  of pain, would refer to Dr. Celene Kras for lumbar radiofrequency.                                                Erick Colace, M.D.    AEK/MEDQ  D:  12/26/2002  12:48:30  T:  12/26/2002 13:20:11  Job:  [id number]

## 2010-05-31 NOTE — Assessment & Plan Note (Signed)
Dylan Farley is back regarding his chronic back pain.  Pain ranging still at a 5-  6/10.  He likes the 80 mg q.12 Kadian dosing.  He continues to take his  hydrocodone 10/650 for breakthrough pain.  He usually uses one or two of  these a day.  We performed an MRI of his lumbar spine which really was not  remarkable.  It continued to show facet arthropathy as well as mild disk  bulges and protrusions.  No definitive change was seen from the 2004  examination.  Pain is described as intermittent, dull, it mostly involves  the back at the belt line with some pain in the right buttock region.  Pain  increases with bending and standing and improves with the rest of his  medications.  He can walk about an hour without having to stop currently.   REVIEW OF SYSTEMS:  Positive for recent viral infection.  Otherwise, denies  any new neurological, psychiatric, constitutional, GU, GI or  cardiorespiratory complaints today.   SOCIAL HISTORY:  The patient is married, lives with his wife.   PHYSICAL EXAM:  Blood pressure is 116/70, pulse 69, respiratory rate 16, he  is sating 97% on room air.  The patient is pleasant, alert and oriented x3.  Affect is quite appropriate.  Gait is slightly wide-based.  He had pain over  both PSIS areas today.  No pelvic asymmetries were noted.  He had some pain  with extension of his __________, but not as much as in the past.  He had a  positive Patrick's test on the right and positive compression test on the  right today.  Left-sided SI joint testing was negative to equivocal.  Motor  function was 5/5.  Sensory function was intact in both lower extremities.  HEART:  Regular rate and rhythm.  LUNGS:  Are clear.  ABDOMEN:  Soft, nontender.   ASSESSMENT:  1.  Facet arthropathy of the lumbar spine with degenerative disk component.  2.  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  3.  History of right-sided piriformis syndrome.   PLAN:  1.  I will send the  patient for diagnostic/therapeutic injections, with Dr.      Wynn Banker, to both SI joints.  We have attempted facet injections in the      past which helped initially but failed to provide long term results.  2.  Continue Kadian 80 mg q.12h. and hydrocodone 10/650 one q.6h. p.r.n.  3.  Continue Lidoderm and Lyrica.  4.  See the patient after he returns from [facility] joint injections.      Ranelle Oyster, M.D.  Electronically Signed     ZTS/MedQ  D:  06/02/2005 11:45:01  T:  06/02/2005 20:34:31  Job #:  [id number]   cc:   Angus G. Renard Matter, MD  Fax: [phone]

## 2010-05-31 NOTE — Assessment & Plan Note (Signed)
MEDICAL RECORD NUMBER:  [id number].   Dylan Farley is back regarding his chronic low back pain. He has been fairly well  controlled with the Kadian and hydrocodone. He is using Lyrica 150 mg t.i.d.  and uses Lidoderm patches for localized relief. Pain usually is a 4 to 5/10.  The patient is described as sharp and aching. It interferes with general  activity, relations with others, enjoyment of life. Sleep is fair to good.  He tries to walk and stay active around the house although activity seems to  exacerbate his pain complaints.   REVIEW OF SYSTEMS:  The patient denies any new neurological or psychiatric  issues today. He has had some weight loss. Denies any GI, GU, or  cardiorespiratory complaints today.   SOCIAL HISTORY:  The patient is married. He is a gap now between his short  and long term disability coverage. He will have to play for his medications  for approximately one month with the hope of being of reimbursed at a later  point.   PHYSICAL EXAMINATION:  VITAL SIGNS:  Blood pressure is 124/79, pulse 66,  respiratory rate 18. He is saturating 99% on room air.  GENERAL:  The patient is pleasant in no acute distress. His affect is bright  and appropriate. Gait is stable. He continues to have problems with  extension and facet maneuvers, left more so than right. Piriformis testing  is equivocal although he had some pain along the left gluteal region today.  Lumbar facets were painful at L4 through S1 again today.  HEART:  Regular rate and rhythm.  LUNGS:  Clear.  ABDOMEN:  Soft, nontender.  NEUROLOGICAL:  Cognitively, the patient is appropriate.   ASSESSMENT:  1.  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  2.  Right sided piriformis syndrome.   PLAN:  1.  Will switch the patient from Kadian to MS Contin for the short term so      he can better afford the medication although his pain relief will not be      as continuous. Hopefully,  he can make do on this for now. We will      increase hydrocodone in the short term up from t.i.d. to q.i.d. dispense      #120 of this of the 10/650 dose today.  2.  Decrease Lyrica for the short term 150 mg b.i.d.  3.  Continue Lidoderm patches for localized relief. I gave the patient      samples of Lidoderm and Lyrica today.  4.  I will see the patient back in about one month's time to discussed      titration of his medication and hopeful return to the Kadian.      Ranelle Oyster, M.D.  Electronically Signed     ZTS/MedQ  D:  11/29/2004 12:23:47  T:  11/29/2004 17:05:12  Job #:  [id number]

## 2010-05-31 NOTE — Assessment & Plan Note (Signed)
MEDICAL RECORD NUMBER:  [id number]   Dylan Farley is back regarding his low-back pain.  I sent him for SI joint  injections bilaterally by Dr. Wynn Banker last month.  He had equivocal  results.  He thinks they may have helped but is not completely sure.  His  pain levels are essentially at a 5/10 to 7/10 regardless.  He is a little  closer to 7 on bad days and closer to 4-5 on good days.  He states that he  feels much improved with the pain regimen he has been on despite the fact  that he still has discomfort.  Pain is described as intermittent, aching.  Pain interferes with general activity, relations with others, and enjoyment  of life on a moderate level.  Sleep is fair.  He has more pain with bending  and sitting for prolonged periods of time as well as standing.  He had some  pain into the buttock areas, right greater than left, but nothing down into  the legs recently.   REVIEW OF SYSTEMS:  The patient denies any new neurological or psychiatric  problems though he does report weight loss, night sweats.  No other changes  are noted.  Full review is in the health and history section.   SOCIAL HISTORY:  The patient is married, living with his wife.   PHYSICAL EXAMINATION:  Blood pressure is 117/76, pulse is 71, respiratory  rate is 16, he is saturating 100% in room air.  The patient is pleasant, no  acute distress.  He is alert and oriented x3.  Weight is stable.  He  continues to have pain in the PSIS areas which is slightly greater with  flexion and also with extension today.  SI joint testing was equivocal.  Motor function 5/5.  Sensory exam is stable.  Heart is regular rate and  rhythm, lungs are clear, abdomen soft and nontender.   PLAN:  1.  Facet arthropathy of lumbar spine with degenerative disc component.  2.  Questionable sacroiliac joint dysfunction.  3.  History of right-sided pyriformis syndrome.   PLAN:  1.  At this point we will maintain the patient's current regimen.  We  could      consider facet injections in the future, though the patient is not eager      to pursue these and I do not know that they will be absolutely      beneficial.  Reconsider in the fall or winter.  2.  I refilled Kadian 80 mg q.12h. #60, and hydrocodone 10/650 one q.6h.      p.r.n. #120.  3.  I refilled Elavil 10 mg one to two q.h.s. #60, and Lyrica 150 mg t.i.d.      #90.  4.  I will see the patient back in 3 months' time.  He will see the [facility] in 1 month.      Ranelle Oyster, M.D.  Electronically Signed     ZTS/MedQ  D:  07/23/2005 12:49:24  T:  07/23/2005 15:46:25  Job #:  [id number]   cc:   Angus G. Renard Matter, MD  Fax: [phone]

## 2010-05-31 NOTE — Assessment & Plan Note (Signed)
HISTORY:  The patient is back regarding his low back and leg pain.  He did  nicely with piriformis injection which we performed last time, but still has  some periodic pain down the right leg.  He feels that his pain is most  severe, however, in his low back.  He has gone back to work, and since  returning to work seems to have flared up his pain.  The right low  back/flank area seems to be the worst.  The area is worse with activity.  He  has some problems sleeping with it.  He rates his pain as a 7/10.  His  radiating leg pain is not as consistent.  He denies any numbness in the leg.  He does have occasional weakness, although this may be more pain inhibition.  He is using Lorcet three to four a day for break-through pain, as well as  the Skelaxin.  He is on no chronic opioid at this point.   REVIEW OF SYSTEMS:  The patient denies any chest pain, shortness of breath,  cold, wheezing, coughing, or dizziness.  He denies numbness, spasms, or  anxiety.  His sleep has been poor, secondary to pain.  He denies nausea,  vomiting, reflux, diarrhea, or constipation.  He has had no bladder issues.   PHYSICAL EXAMINATION:  VITAL SIGNS:  Blood pressure 130/76, pulse 62,  saturation 99% on room air.  NEUROLOGIC/MUSCULOSKELETAL:  The patient has a loss of normal gait without a  significant limp.  On range of motion of the back, he had lumbar flexion to  approximately 75 degrees without significant pain.  Lumbar extension was  without pain today.  With lateral bending to the left, he seems to have more  pain in the right flank area.  Rightward bending caused left pain and he had  minimal pain with rotation of the lumbar spine.  He had minimal tenderness  at the PSIS areas bilaterally and over the lumbar facets.  He did have pain  along the iliac crest approximately six to eight inches lateral to the  spinous process.  It seemed to be painful on palpation near the area of the  internal obliques and  quadratus lumborum muscle.  Straight leg raising was  negative.  The patient did have some hamstring tightness.  The piriformis  testing was negative.  Patrick's test was negative.  There was some pain on  palpation of the piriformis, but decreased from the last visit.   ASSESSMENT:  1. Low back pain which has been multifactorial.  The facet component seems     to have improved.  He seems to have some myofascial-type pain at this     point involving the quadratus lumborum region.  2. Piriformis syndrome which seems to be improved to a great extent.   PLAN:  1. After an examination, we injected the right quadratus lumborum today at     the iliac crest with 2 mL of 1% lidocaine and 40 mg of Kenalog.  The     patient tolerated this well.  2. Will continue with Skelaxin p.r.n. spasms and Lorcet for break-through     pain 10/650 mg.  3. Will begin a fentanyl patch 25 mcg q.72h. for baseline pain control.  4. He needs to focus on low back and abdominal strengthening and stretching     of the lower extremities.  5. I have written him a note to return to work as tolerated.  6. I will see  him back in approximately one month's time.      Ranelle Oyster, M.D.   ZTS/MedQ  D:  04/03/2003 11:30:55  T:  04/03/2003 12:13:37  Job #:  [id number]   cc:   Butch Penny, M.D.

## 2010-05-31 NOTE — Procedures (Signed)
NAME:  Dylan Farley, Dylan Farley NO.:  [id number]   MEDICAL RECORD NO.:  [id number]                   PATIENT TYPE:  REC   LOCATION:  [facility]                                  FACILITY:  [facility]   PHYSICIAN:  Celene Kras, MD                     DATE OF BIRTH:  [birth year]   DATE OF PROCEDURE:  DATE OF DISCHARGE:                                 OPERATIVE REPORT   HISTORY OF PRESENT ILLNESS:  Dylan Farley comes to the pain center  management.  I evaluated him with the 14 point review of systems.  Mr.  Farley chart is reviewed, reviewed available imaging reports, examined the  patient, and discussed treatment limitations and options.  He is an  individual that is complaining of persistent paralumbar discomfort with  declining functional indices secondary to pain.  It is primarily directed to  the right, pain with extension, and consistent with biomechanical pain.  He  has had a transforaminal injection with marginal result, good release  cycling, but brief.  He was then followed with medial branch injection at L5  S1 and L4-5.  He had a marginal release cycling again, less than 24 hours.   RECOMMENDATIONS:  1. In consideration of radiofrequency neuroablation it is reasonable to go     ahead and proceed with another medial branch injection by my hand, L5 S1,     L4-5, and L3-4 with extended levels, contributory intervasion addressed.     This would approximate the radiofrequency access points and I reviewed     this with him.  The risks, complications, and options are fully outlined.  2. Recommend cigarette cessation for the best outcome.  3. Other lifestyle enhancements discussed.  He is an Personnel officer by trade,     which has quite a bit of biomechanical stress on his low back.  I asked     him to talk to Dr. Wynn Banker about this.   OBJECTIVE:  Diffuse paralumbar myofascial discomfort.  Pain over PSIS.  Notable pain on extension.  Gaenslen and Patrick's are equivocal.   He has no  other over neurological deficit on motor, sensory, and flexion.   IMPRESSION:  Facet syndrome, __________, lumbar spine.   PLAN:  Facet medial branch injection, right side.  Independent needle access  points L5 S, L4-5, L3-4, L2-3.  Contributory intervasion addressed.  He is  consented.   DESCRIPTION OF PROCEDURE:  The patient was taken to the fluoroscopy and  placed in the prone position.  The back was prepped and draped in the usual  fashion.  Using a 22 gauge needle I advanced to the facet medial branch L5  S1, L4-5, L3-4, L2-3.  Contributory intervasion addressed.  The right side  __________ independent needle access points are then injected, 1 cc of  Marcaine 0.25% NPF with a total of 40 mg of Aristocort divided dose.  Marcaine  was injected at 1 cc at each level.   He tolerated this procedure well.  In the recovery area he had near complete  diminishment of pain perception, so we will more than likely move forward to  radiofrequency neuroablation.  I discussed this procedure in detail with  him.  He will assess this within the context of activities of daily living  and further determination and care will be made in follow up, at  approximately three weeks.  I will go ahead and prescribe him hydrocodone as  a bridging prescription to be followed at Dr. Wynn Banker for medication  management long term.   No barriers to communication.  Discharge instructions understood.                                                Celene Kras, MD    HH/MEDQ  D:  01/10/2003 10:27:20  T:  01/10/2003 11:24:07  Job:  [id number]

## 2010-06-11 ENCOUNTER — Encounter: Payer: Medicare Other | Attending: Physical Medicine & Rehabilitation | Admitting: Physical Medicine & Rehabilitation

## 2010-06-11 ENCOUNTER — Emergency Department (HOSPITAL_COMMUNITY)
Admission: EM | Admit: 2010-06-11 | Discharge: 2010-06-11 | Disposition: A | Discharge status: Home / Self Care | Payer: Medicare Other | Attending: Emergency Medicine | Admitting: Emergency Medicine

## 2010-06-11 DIAGNOSIS — F17200 Nicotine dependence, unspecified, uncomplicated: Secondary | ICD-10-CM | POA: Insufficient documentation

## 2010-06-11 DIAGNOSIS — M549 Dorsalgia, unspecified: Secondary | ICD-10-CM | POA: Insufficient documentation

## 2010-06-11 DIAGNOSIS — G8929 Other chronic pain: Secondary | ICD-10-CM | POA: Insufficient documentation

## 2010-06-14 ENCOUNTER — Encounter: Payer: Medicare Other | Attending: Physical Medicine & Rehabilitation | Admitting: Physical Medicine & Rehabilitation

## 2010-06-14 DIAGNOSIS — S4490XS Injury of unspecified nerve at shoulder and upper arm level, unspecified arm, sequela: Secondary | ICD-10-CM | POA: Insufficient documentation

## 2010-06-14 DIAGNOSIS — M545 Low back pain, unspecified: Secondary | ICD-10-CM | POA: Insufficient documentation

## 2010-06-14 DIAGNOSIS — F329 Major depressive disorder, single episode, unspecified: Secondary | ICD-10-CM

## 2010-06-14 DIAGNOSIS — X58XXXS Exposure to other specified factors, sequela: Secondary | ICD-10-CM | POA: Insufficient documentation

## 2010-06-14 DIAGNOSIS — M5380 Other specified dorsopathies, site unspecified: Secondary | ICD-10-CM

## 2010-06-14 DIAGNOSIS — G5700 Lesion of sciatic nerve, unspecified lower limb: Secondary | ICD-10-CM

## 2010-06-14 DIAGNOSIS — M129 Arthropathy, unspecified: Secondary | ICD-10-CM | POA: Insufficient documentation

## 2010-06-14 DIAGNOSIS — M47817 Spondylosis without myelopathy or radiculopathy, lumbosacral region: Secondary | ICD-10-CM | POA: Insufficient documentation

## 2010-06-14 DIAGNOSIS — F3289 Other specified depressive episodes: Secondary | ICD-10-CM

## 2010-06-14 NOTE — Assessment & Plan Note (Signed)
Dylan Farley is back regarding his low back pain.  He is getting his Opana again and pain has been generally stating stable around 5-6/10.  Pain is burning, aching, involving in the back and legs.  Lyrica seems to be helping him.  He is staying active with his wife.  He is going on vacation next week and looking life forward to that.  He is sleeping fairly well.  REVIEW OF SYSTEMS:  Notable for some fever and chills and recent earache.  He had some weight gain.  Full 12-point review is in the written health and history section of the chart.  SOCIAL HISTORY:  The patient is married, living with his wife as noted above.  PHYSICAL EXAMINATION:  VITAL SIGNS: Blood pressure is 131/70, pulse 69, respiratory rate 18, satting 100% on room air. GENERAL:  The patient is pleasant, alert, oriented x3.  Affect showing bright and appropriate.  He has pain still with extension more than flexion.  Strength remains 5/5 and normal. Sensory exam is seen.  ASSESSMENT: 1. Lumbar spondylosis with facet arthropathy. 2. History of ulnar neurapraxic injury, left upper extremity.  PLAN:  We will maintain his current medications including his fentanyl patch 50 mcg q.72 h. and oxycodone IR 10 mg #90 one q.6 h. p.r.n.. Followup with my nurse practitioner about a month.  He actually picked up his prescriptions yesterday from our office.  Denies any other problems.  All questions were answered.     Ranelle Oyster, M.D. Electronically Signed    ZTS/MedQ D:  06/14/2010 10:46:08  T:  06/14/2010 11:06:55  Job #:  [id number]  cc:   Angus G. Renard Matter, MD Fax: [phone]

## 2010-07-11 ENCOUNTER — Encounter: Payer: Medicare Other | Admitting: Neurosurgery

## 2010-07-11 DIAGNOSIS — M5430 Sciatica, unspecified side: Secondary | ICD-10-CM

## 2010-07-12 NOTE — Assessment & Plan Note (Signed)
This patient of Dr. Riley Kill follows up for his back pain.  He states he does okay on his current regimen.  His pain averages about 6, it is a burning, aching-type pain, it is intermittent.  General activity level is about 5.  Sleep patterns are fair.  Pain is worse with bending, sitting and standing.  Rest and medication tend to help.  He walks without assistance.  He can walk about 30 minutes, climb steps and drives.  He is on disability.  REVIEW OF SYSTEMS:  Notable for the difficulties stated above as well as some night sweats, weight loss and fever.  PAST MEDICAL HISTORY:  Unchanged.  SOCIAL HISTORY:  He is married, lives with his wife.  FAMILY HISTORY:  Unchanged.  PHYSICAL EXAMINATION:  VITAL SIGNS:  His blood pressure is 155/102, pulse 71, respirations 12, O2 sats 99 on room air. NEURO:  His motor strength is good.  Sensation is intact.  He is alert and oriented x3.  Constitutionally within normal limits.  ASSESSMENT: 1. Lumbar spondylosis with facet arthropathy. 2. 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  PLAN: 1. He will continue his current medication regimen.  Refill today     fentanyl patch 50 mg 1 transdermally every 72 hours 10 with no     refill. 2. Oxycodone 10 mg IR 1 p.o. q.6 h. 90 with no refill.  His questions were encouraged and answered.  He will follow up in the clinic with Dr. Riley Kill in 1 month.     Stehanie Ekstrom L. Blima Dessert Electronically Signed    RLW/MedQ D:  07/11/2010 10:20:58  T:  07/12/2010 00:09:01  Job #:  [id number]

## 2010-08-12 ENCOUNTER — Encounter: Payer: Medicare Other | Attending: Neurosurgery | Admitting: Neurosurgery

## 2010-08-12 DIAGNOSIS — M129 Arthropathy, unspecified: Secondary | ICD-10-CM | POA: Insufficient documentation

## 2010-08-12 DIAGNOSIS — M47817 Spondylosis without myelopathy or radiculopathy, lumbosacral region: Secondary | ICD-10-CM | POA: Insufficient documentation

## 2010-08-12 DIAGNOSIS — G8929 Other chronic pain: Secondary | ICD-10-CM | POA: Insufficient documentation

## 2010-08-12 DIAGNOSIS — S4490XS Injury of unspecified nerve at shoulder and upper arm level, unspecified arm, sequela: Secondary | ICD-10-CM | POA: Insufficient documentation

## 2010-08-12 DIAGNOSIS — M48061 Spinal stenosis, lumbar region without neurogenic claudication: Secondary | ICD-10-CM

## 2010-08-12 DIAGNOSIS — X58XXXS Exposure to other specified factors, sequela: Secondary | ICD-10-CM | POA: Insufficient documentation

## 2010-08-12 NOTE — Assessment & Plan Note (Signed)
HISTORY:  [name] is a patient of Dr. Riley Kill, followed for chronic low back pain.  He has spondylosis as well as some facet arthropathy. He is doing okay on his current pain medicine regimen.  He rates his pain today at 5-7.  He has been out of town and aggravated some with his trip, has a sharp to dull pain that is intermittent.  General activity level is 5.  Pain is pretty much the same 24 hours a day.  Sleep patterns are fair.  Pain is worse with sitting and standing.  Medication tends to help.  He walks without assistance.  He can climb steps and driving, walk up 30 minutes at a time.  He is not employed.  REVIEW OF SYSTEMS:  Notable for difficulties as described above as well as some weight loss or gain, otherwise unremarkable.  PAST MEDICAL HISTORY:  Unchanged.  SOCIAL HISTORY:  Married, lives with his wife.  FAMILY HISTORY:  Unchanged.  PHYSICAL EXAMINATION:  VITAL SIGNS:  Blood pressure 134/77, pulse 73, respirations 16, and O2 sats 100% on room air. EXTREMITIES:  His motor strength is 5/5 in lower extremities.  Sensation is intact. CONSTITUTIONAL:  He is within normal limits.  he is alert and oriented x3.  He has normal gait.  ASSESSMENT: 1. Lumbar spondylosis. 2. Facet arthropathy. 3. History of left upper extremity ulnar nerve plexus injury.  PLAN: 1. Refill fentanyl 50 mcg transdermally 1 transdermal every 72 hours,     #10 with no refill. 2. Oxycodone IR 10 mg 1 p.o. q.6 h. p.r.n. 90 with no refill. 3. Follow up with Dr. Riley Kill in 1 month.  His questions were     encouraged and answered.  Oswestry score is 20.     Dylan Farley L. Dylan Farley Electronically Signed    RLW/MedQ D:  08/12/2010 13:19:17  T:  08/12/2010 23:32:47  Job #:  [id number]

## 2010-09-11 ENCOUNTER — Encounter: Payer: Medicare Other | Attending: Physical Medicine & Rehabilitation | Admitting: Physical Medicine & Rehabilitation

## 2010-09-11 DIAGNOSIS — G5700 Lesion of sciatic nerve, unspecified lower limb: Secondary | ICD-10-CM

## 2010-09-11 DIAGNOSIS — M129 Arthropathy, unspecified: Secondary | ICD-10-CM | POA: Insufficient documentation

## 2010-09-11 DIAGNOSIS — M5380 Other specified dorsopathies, site unspecified: Secondary | ICD-10-CM

## 2010-09-11 DIAGNOSIS — M47817 Spondylosis without myelopathy or radiculopathy, lumbosacral region: Secondary | ICD-10-CM | POA: Insufficient documentation

## 2010-09-11 DIAGNOSIS — G8929 Other chronic pain: Secondary | ICD-10-CM | POA: Insufficient documentation

## 2010-09-11 DIAGNOSIS — F329 Major depressive disorder, single episode, unspecified: Secondary | ICD-10-CM

## 2010-09-11 DIAGNOSIS — M5430 Sciatica, unspecified side: Secondary | ICD-10-CM

## 2010-09-12 NOTE — Assessment & Plan Note (Signed)
HISTORY:  Dylan Farley is back regarding his chronic back pain.  He asked questions about the fentanyl patch and is asking if he can go back to the Opana.  He finds that the fentanyl will only last for 2 days.  He does report that he does sweat a bit and does tend to become a bit warm. Pain is about 6-7/10.  He describes the pain is intermittent, constant, burning, involving the legs and low back.  REVIEW OF SYSTEMS:  Notable for the above.  Has had some weight loss recently.  Full 12-point review is in the written health and history section of the chart.  SOCIAL HISTORY:  The patient is married, living with his wife.  PHYSICAL EXAMINATION:  VITAL SIGNS:  Blood pressure is 135/79, pulse 76, respiratory rate 18, and satting 97% on room air. GENERAL:  The patient is pleasant, alert and oriented x3.  Affect showing bright and appropriate.  Strength is 5/5 in both legs.  Normal sensory function.  Backs bit more painful with extension over flexion today.  ASSESSMENT:  Lumbar spondylosis and facet arthropathy.  PLAN: 1. We will switch back from his fentanyl to Opana 40 mg q.12 h.  He     does seem like the oxycodone, so we will stay at the oxycodone 10     mg q.6 h. p.r.n.  I wrote 60 and 90 of these drugs respectively. 2. My nurse will see the patient back in a month and I will see him     back in 6 months' time.     Ranelle Oyster, M.D. Electronically Signed    ZTS/MedQ D:  09/11/2010 13:13:17  T:  09/11/2010 13:44:33  Job #:  [id number]

## 2010-10-09 ENCOUNTER — Encounter: Payer: Medicare Other | Attending: Neurosurgery | Admitting: Neurosurgery

## 2010-10-09 DIAGNOSIS — M47817 Spondylosis without myelopathy or radiculopathy, lumbosacral region: Secondary | ICD-10-CM

## 2010-10-09 DIAGNOSIS — M129 Arthropathy, unspecified: Secondary | ICD-10-CM | POA: Insufficient documentation

## 2010-10-09 DIAGNOSIS — M545 Low back pain, unspecified: Secondary | ICD-10-CM | POA: Insufficient documentation

## 2010-10-09 NOTE — Assessment & Plan Note (Signed)
This is a patient of Dr. Riley Kill, seen for chronic low back pain.  He is unchanged with his pain today.  He rates it as 5-6.  It is a intermittent tingling and aching pain.  General activity level is 5-6. Pain is worse with bending, sitting, standing.  Medication tends to help.  He walks without assistance.  He can walk up to an hour at a time.  He climbs steps and drives.  He is not employed.  REVIEW OF SYSTEMS:  Notable for difficulties described above as well as some weight fluctuations, fever, otherwise no suicidal thoughts or aberrant behaviors.  PAST MEDICAL HISTORY/SOCIAL HISTORY/FAMILY HISTORY:  Unchanged.  PHYSICAL EXAMINATION:  His blood pressure is 127/83, pulse 67, respirations 16, O2 sats 100 on room air.  Motor strength 5/5 in lower extremities.  Sensation is intact.  Constitutionally, he is thin.  He is alert and oriented x3.  He has normal gait.  ASSESSMENT:  Lumbar spondylosis with facet arthropathy.  PLAN: 1. Refill Opana ER 40 mg 1 p.o. q.12 h., #60 with no refill. 2. Oxycodone IR 10 mg 1 p.o. q.6 hours p.r.n., #90 with no refills. 3. Questions were encouraged and answered.  We will see him back here     in a month.     Teriann Livingood L. Blima Dessert Electronically Signed    RLW/MedQ D:  10/09/2010 11:02:59  T:  10/09/2010 12:33:57  Job #:  [id number]

## 2010-11-06 ENCOUNTER — Encounter: Payer: Medicare Other | Attending: Neurosurgery | Admitting: Neurosurgery

## 2010-11-06 DIAGNOSIS — M5380 Other specified dorsopathies, site unspecified: Secondary | ICD-10-CM

## 2010-11-06 DIAGNOSIS — M47817 Spondylosis without myelopathy or radiculopathy, lumbosacral region: Secondary | ICD-10-CM

## 2010-11-06 DIAGNOSIS — R45851 Suicidal ideations: Secondary | ICD-10-CM | POA: Insufficient documentation

## 2010-11-06 NOTE — Assessment & Plan Note (Signed)
This patient of Dr. Riley Kill seen for chronic low back pain and bilateral radicular pain, right greater than left.  He states he has had no change in his pain, rates it as 6, sharp intermittent aching pain.  General activity level is 5.  He did not indicate what time of day the pain is worse.  It is worse with bending, sitting, standing.  Rest and medication tend to help.  He walks without assistance.  He can walk an hour at a time.  He climb steps and drives.  He is on disability.  REVIEW OF SYSTEMS:  Notable for difficulties described above as well as some fever, chills, weight gain.  No suicidal thoughts or aberrant behaviors.  Last pill count and UDS were consistent.  PAST MEDICAL HISTORY:  Unchanged.  SOCIAL HISTORY:  Unchanged.  FAMILY HISTORY:  Unchanged.  PHYSICAL EXAMINATION:  Blood pressure 137/69, pulse 81, respirations 18, O2 sats 91% on room air.  His motor strength and sensation are intact neurologically.  Constitutionally, he is within normal limits.  Alert and oriented x3.  His affect is bright.  His gait is normal.  ASSESSMENT:  Lumbar spondylosis with suicidal thoughts.  PLAN:  Refill Opana ER 40 mg 1 p.o. q.12 h., 60 with no refill, oxycodone IR 10 mg 1 p.o. q.6 h. p.r.n., 90 with no refills.  Questions were encouraged and answered.  I will see him back in a month.     Flo Berroa L. Blima Dessert Electronically Signed    RLW/MedQ D:  11/06/2010 10:51:19  T:  11/06/2010 14:26:01  Job #:  [id number]

## 2010-12-03 ENCOUNTER — Encounter: Payer: Medicare Other | Attending: Neurosurgery | Admitting: Neurosurgery

## 2010-12-03 DIAGNOSIS — M47817 Spondylosis without myelopathy or radiculopathy, lumbosacral region: Secondary | ICD-10-CM | POA: Insufficient documentation

## 2010-12-03 DIAGNOSIS — M545 Low back pain, unspecified: Secondary | ICD-10-CM | POA: Insufficient documentation

## 2010-12-03 DIAGNOSIS — G8928 Other chronic postprocedural pain: Secondary | ICD-10-CM

## 2010-12-03 NOTE — Assessment & Plan Note (Signed)
This is a patient of Dr. Riley Kill seen for chronic low back pain and bilateral radicular symptoms.  He reports no change in his pain.  He rates it a 5-7, sharp to dull stabbing, aching pain.  General activity level is 5.  He does not indicate when the pain is worse.  His sleep patterns are fair.  Pain is worse with bending, sitting, standing. Medications help some.  He walks without assistance.  He climb steps and drive.  He does not work.  REVIEW OF SYSTEMS:  Notable for difficulties described above as well as some fever, weight gain, weight loss.  No suicidal thoughts or aberrant behaviors.  Last pill and UDS consistent.  Past medical history, social history, and family history unchanged.  PHYSICAL EXAMINATION:  VITAL SIGNS:  Blood pressure is 133/78, pulse 66, respirations 18, O2 sats 100 on room air. NEUROLOGIC:  His motor strength and sensation are intact. CONSTITUTIONAL:  He is within normal limits.  He is alert and oriented x3.  He has a normal gait.  ASSESSMENT:  History of lumbar spondylosis.  No more suicidal thoughts, doing much better.  PLAN: 1. Refill Elavil 25 mg 1 p.o. at bedtime with 5 refills, #30. 2. Oxycodone IR 10 mg 1 p.o. q.6 hours p.r.n., #90 with no refill. 3. Opana ER 40 mg 1 p.o. q.12 hours, #60 with no refills. Questions were encouraged and answered, and I will see him in a month.     Dylan Farley L. Blima Dessert Electronically Signed    RLW/MedQ D:  12/03/2010 08:58:41  T:  12/03/2010 09:23:15  Job #:  [id number]

## 2010-12-31 ENCOUNTER — Ambulatory Visit: Payer: Medicare Other | Admitting: Neurosurgery

## 2010-12-31 ENCOUNTER — Encounter: Payer: Medicare Other | Attending: Neurosurgery | Admitting: Neurosurgery

## 2010-12-31 DIAGNOSIS — M79609 Pain in unspecified limb: Secondary | ICD-10-CM | POA: Insufficient documentation

## 2010-12-31 DIAGNOSIS — M47817 Spondylosis without myelopathy or radiculopathy, lumbosacral region: Secondary | ICD-10-CM

## 2010-12-31 DIAGNOSIS — M545 Low back pain, unspecified: Secondary | ICD-10-CM | POA: Insufficient documentation

## 2011-01-01 NOTE — Assessment & Plan Note (Signed)
This is a patient of Dr. Arlyss Farley seen for lower extremity pain and low back pain.  He rates his pain as 6-7.  It is a sharp, dull, aching- type pain.  General activity level is 4-5.  He does not indicate what aggravates or improves his pain.  Mobility wise, he walks independently, climbs steps, and drives.  He is on disability.  REVIEW OF SYSTEMS:  Notable for difficulties as described above as well as some fever, chills, and weight gain, otherwise unremarkable.  PAST MEDICAL HISTORY/SOCIAL HISTORY/FAMILY HISTORY:  Unchanged.  PHYSICAL EXAMINATION:  VITAL SIGNS:  Blood pressure 142/88, pulse 71, respirations 16, and O2 sats 99 on room air. NEUROLOGIC:  His motor strength and sensation are intact. CONSTITUTIONAL:  He is thin.  He is alert and oriented x3.  He has normal gait.  ASSESSMENT: 1. History of lumbar spondylosis. 2. Mood and affect much brighter, doing much better.  No suicidal     thoughts.  PLAN: 1. Refill Opana ER 40 mg 1 p.o. q.12 hours, #60 with no refill. 2. Oxycodone IR 10 mg 1 p.o. q.6 hours p.r.n., #90 with no refills.     Questions were encouraged and answered.  I will see him in a month.     Dylan Monroy L. Blima Dessert Electronically Signed    RLW/MedQ D:  12/31/2010 13:56:38  T:  01/01/2011 08:16:52  Job #:  [id number]

## 2011-01-28 ENCOUNTER — Encounter: Payer: Medicare Other | Attending: Neurosurgery | Admitting: Neurosurgery

## 2011-01-28 DIAGNOSIS — M47817 Spondylosis without myelopathy or radiculopathy, lumbosacral region: Secondary | ICD-10-CM | POA: Insufficient documentation

## 2011-01-28 DIAGNOSIS — M545 Low back pain, unspecified: Secondary | ICD-10-CM | POA: Insufficient documentation

## 2011-01-28 DIAGNOSIS — M79609 Pain in unspecified limb: Secondary | ICD-10-CM | POA: Insufficient documentation

## 2011-01-29 NOTE — Assessment & Plan Note (Signed)
The patient of Dr. Riley Kill seen for lower extremity pain and low back pain.  He reports no change in his pain at a 5-6.  It is a sharp and aching pain.  General activity level is 5.  The pain is worse in the morning.  Sleep patterns are fair.  Bending, sitting and standing tend to aggravate.  Medication helps.  He walks without assistance.  He climb steps and drives functionally.  He is unemployed.  REVIEW OF SYSTEMS:  Notable for the difficulties described above, otherwise unremarkable.  PAST MEDICAL HISTORY, SOCIAL HISTORY AND FAMILY HISTORY:  Unchanged.  PHYSICAL EXAMINATION:  VITAL SIGNS:  His blood pressure is 146/86, pulse 88, respirations 18 and O2 sats 99 on room air.  EXTREMITIES:  His motor strength and sensation intact. NEUROLOGIC:  Constitutionally, he is within normal limits.  He is alert and oriented x3.  He has a normal gait.  ASSESSMENT: 1. Lumbar spondylosis. 2. Improved affect.  PLAN: 1. Refill oxycodone IR 10 mg 1 every 6 h. p.r.n. 90 with no refill 2. Opana ER 40 mg 1 p.o. q.12 h. 60 with no refills.  His questions     were encouraged and answered.  He will be back here in a month.     Dylan Farley Electronically Signed    RLW/MedQ D:  01/28/2011 12:52:21  T:  01/29/2011 02:38:14  Job #:  [id number]

## 2011-02-26 ENCOUNTER — Encounter: Payer: Medicare Other | Attending: Physical Medicine & Rehabilitation | Admitting: Physical Medicine & Rehabilitation

## 2011-02-26 DIAGNOSIS — M5430 Sciatica, unspecified side: Secondary | ICD-10-CM

## 2011-02-26 DIAGNOSIS — F329 Major depressive disorder, single episode, unspecified: Secondary | ICD-10-CM

## 2011-02-26 DIAGNOSIS — M47817 Spondylosis without myelopathy or radiculopathy, lumbosacral region: Secondary | ICD-10-CM | POA: Insufficient documentation

## 2011-02-26 DIAGNOSIS — M5380 Other specified dorsopathies, site unspecified: Secondary | ICD-10-CM | POA: Insufficient documentation

## 2011-02-26 DIAGNOSIS — M79609 Pain in unspecified limb: Secondary | ICD-10-CM | POA: Insufficient documentation

## 2011-02-26 DIAGNOSIS — M545 Low back pain, unspecified: Secondary | ICD-10-CM | POA: Insufficient documentation

## 2011-02-26 DIAGNOSIS — F3289 Other specified depressive episodes: Secondary | ICD-10-CM

## 2011-02-26 DIAGNOSIS — G5700 Lesion of sciatic nerve, unspecified lower limb: Secondary | ICD-10-CM

## 2011-02-27 NOTE — Assessment & Plan Note (Signed)
Dylan Farley is back regarding his low back and leg pain.  For the most part, the patient has been fairly stable.  Pain is around 5/10.  He has had some occasional "sciatica" but usually he is pretty self limited and improves when he rests.  His medications including his Opana and oxycodone seem to help.  He tries to stay active around the house.  He does work outside, Catering manager.  REVIEW OF SYSTEMS:  Notable for the above.  Full 12-point review is in the written health and history section chart.  Bowels and bladder remain stable.  SOCIAL HISTORY:  Unchanged.  He is seeing Dr. Catalina Pizza now currently for his primary care.  PHYSICAL EXAMINATION:  VITAL SIGNS:  Blood pressure is 140/84, pulse is 82, respiratory rate is 16, and he is saturating 100% on room air. GENERAL:  The patient is pleasant, alert. NEUROLOGIC:  Motor exam is 5/5 in both lower extremities with 1+ reflexes and normal sensory findings.  Straight leg raising and seated slump testing was negative to equivocal.  Back range of motion was stable at 60 to 75 degrees of flexion, 20 degrees of extension, and lateral movement to the sides around 20-25 degrees with only mild pain. Cognitively, he is alert and appropriate.  He remains slight of build.  ASSESSMENT: 1. Lumbar spondylosis. 2. Lumbar facet disease.  PLAN: 1. The patient is stable at this point.  I reinforced regular     stretching and posture maintenance. 2. I refilled his Opana ER 40 mg q.12 h and oxycodone IR 10 mg one q.6     h p.r.n., #90 3. I will see the patient back in 3 months with 29-month nursing     followup.     Ranelle Oyster, M.D. Electronically Signed    ZTS/MedQ D:  02/26/2011 11:04:14  T:  02/27/2011 00:40:07  Job #:  [id number]

## 2011-03-11 ENCOUNTER — Other Ambulatory Visit: Payer: Self-pay | Admitting: *Deleted

## 2011-03-11 MED ORDER — CYCLOBENZAPRINE HCL 10 MG PO TABS: 10.0000 mg | ORAL_TABLET | Freq: Three times a day (TID) | ORAL | Status: DC | PRN

## 2011-03-25 ENCOUNTER — Encounter: Payer: Medicare Other | Attending: Physical Medicine & Rehabilitation | Admitting: *Deleted

## 2011-03-25 ENCOUNTER — Encounter: Payer: Self-pay | Admitting: *Deleted

## 2011-03-25 VITALS — BP 138/87 | HR 84 | Resp 18 | Ht 71.0 in | Wt 170.0 lb

## 2011-03-25 DIAGNOSIS — M79609 Pain in unspecified limb: Secondary | ICD-10-CM | POA: Insufficient documentation

## 2011-03-25 DIAGNOSIS — G8928 Other chronic postprocedural pain: Secondary | ICD-10-CM

## 2011-03-25 DIAGNOSIS — M47817 Spondylosis without myelopathy or radiculopathy, lumbosacral region: Secondary | ICD-10-CM | POA: Insufficient documentation

## 2011-03-25 DIAGNOSIS — G5700 Lesion of sciatic nerve, unspecified lower limb: Secondary | ICD-10-CM

## 2011-03-25 DIAGNOSIS — M48061 Spinal stenosis, lumbar region without neurogenic claudication: Secondary | ICD-10-CM

## 2011-03-25 MED ORDER — OXYCODONE HCL 10 MG PO TABS: 10.0000 mg | ORAL_TABLET | Freq: Four times a day (QID) | ORAL | Status: DC | PRN

## 2011-03-25 MED ORDER — OXYMORPHONE HCL ER 40 MG PO TB12: 40.0000 mg | ORAL_TABLET | Freq: Two times a day (BID) | ORAL | Status: DC

## 2011-03-25 NOTE — Progress Notes (Signed)
[name] reports that about 2 weeks ago he had an episode where he could not move to get out of bed, due to back pain. Denies acute spasms at the time. Said he felt he was nailed to the bed. He eventually was able to get up and felt better as he moved around. He does not live alone. No questions voiced for the MD.

## 2011-04-21 ENCOUNTER — Encounter: Payer: Self-pay | Admitting: Physical Medicine & Rehabilitation

## 2011-04-24 ENCOUNTER — Encounter: Payer: Self-pay | Admitting: *Deleted

## 2011-04-24 ENCOUNTER — Encounter: Payer: Medicare Other | Attending: Physical Medicine & Rehabilitation | Admitting: *Deleted

## 2011-04-24 VITALS — BP 121/76 | HR 72 | Resp 18 | Ht 71.0 in | Wt 169.0 lb

## 2011-04-24 DIAGNOSIS — M47817 Spondylosis without myelopathy or radiculopathy, lumbosacral region: Secondary | ICD-10-CM | POA: Insufficient documentation

## 2011-04-24 DIAGNOSIS — M5380 Other specified dorsopathies, site unspecified: Secondary | ICD-10-CM | POA: Insufficient documentation

## 2011-04-24 DIAGNOSIS — G8928 Other chronic postprocedural pain: Secondary | ICD-10-CM

## 2011-04-24 DIAGNOSIS — M539 Dorsopathy, unspecified: Secondary | ICD-10-CM

## 2011-04-24 DIAGNOSIS — M5430 Sciatica, unspecified side: Secondary | ICD-10-CM

## 2011-04-24 DIAGNOSIS — M48061 Spinal stenosis, lumbar region without neurogenic claudication: Secondary | ICD-10-CM

## 2011-04-24 DIAGNOSIS — M79609 Pain in unspecified limb: Secondary | ICD-10-CM | POA: Insufficient documentation

## 2011-04-24 DIAGNOSIS — M545 Low back pain, unspecified: Secondary | ICD-10-CM | POA: Insufficient documentation

## 2011-04-24 MED ORDER — OXYMORPHONE HCL ER 40 MG PO TB12: 40.0000 mg | ORAL_TABLET | Freq: Two times a day (BID) | ORAL | Status: DC

## 2011-04-24 MED ORDER — OXYCODONE HCL 10 MG PO TABS: 10.0000 mg | ORAL_TABLET | Freq: Four times a day (QID) | ORAL | Status: DC | PRN

## 2011-04-24 NOTE — Progress Notes (Signed)
Reports no change in pain complaint. States his meds are kept in safe place at home. No questions voiced for MD.

## 2011-05-23 ENCOUNTER — Encounter: Payer: Self-pay | Admitting: Physical Medicine & Rehabilitation

## 2011-05-23 ENCOUNTER — Encounter: Payer: Medicare Other | Attending: Physical Medicine & Rehabilitation | Admitting: Physical Medicine & Rehabilitation

## 2011-05-23 VITALS — BP 165/83 | HR 58 | Ht 71.0 in | Wt 174.0 lb

## 2011-05-23 DIAGNOSIS — M47817 Spondylosis without myelopathy or radiculopathy, lumbosacral region: Secondary | ICD-10-CM

## 2011-05-23 DIAGNOSIS — M47816 Spondylosis without myelopathy or radiculopathy, lumbar region: Secondary | ICD-10-CM | POA: Insufficient documentation

## 2011-05-23 MED ORDER — OXYCODONE HCL 10 MG PO TABS: 10.0000 mg | ORAL_TABLET | Freq: Four times a day (QID) | ORAL | Status: DC | PRN

## 2011-05-23 MED ORDER — OXYMORPHONE HCL ER 40 MG PO TB12: 40.0000 mg | ORAL_TABLET | Freq: Two times a day (BID) | ORAL | Status: DC

## 2011-05-23 NOTE — Patient Instructions (Signed)
Continue activity and stretching as tolerated

## 2011-05-23 NOTE — Progress Notes (Signed)
  Subjective:    Patient ID: Dylan Farley, male    DOB: 02-14-1955, 56 y.o.   MRN: [id number]  HPI  Dylan Farley is back regarding his chronic low back pain. He states that a few weeks back his back pain seemed to flair.  He feels that he may be a little more tender  As a whole. He tries to stay active with things around the house.  He's not as active with the churchs as he was before.  His mood has been good.  He remains on the opana ER for pain control along with the oxy ir.  Bowel and bladder function have been normal.   Pain Inventory Average Pain 6 Pain Right Now 4 My pain is intermittent, sharp, burning, dull and aching  In the last 24 hours, has pain interfered with the following? General activity 5 Relation with others 5 Enjoyment of life 5 What TIME of day is your pain at its worst? varies  Sleep (in general) Fair  Pain is worse with: bending, sitting and standing Pain improves with: rest and medication Relief from Meds: 7  Mobility walk without assistance how many minutes can you walk? 40 min ability to climb steps?  yes do you drive?  yes Do you have any goals in this area?  no  Function disabled: date disabled 2005  Neuro/Psych weakness  Prior Studies Any changes since last visit?  no  Physicians involved in your care Any changes since last visit?  no       Review of Systems  Constitutional: Positive for unexpected weight change.  HENT: Negative.   Eyes: Negative.   Respiratory: Negative.   Cardiovascular: Negative.   Gastrointestinal: Negative.   Genitourinary: Negative.   Musculoskeletal: Negative.   Skin: Negative.   Neurological: Positive for weakness.  Hematological: Negative.   Psychiatric/Behavioral: Negative.        Objective:   Physical Exam  Constitutional: He is oriented to person, place, and time. He appears well-developed and well-nourished.  HENT:  Head: Normocephalic and atraumatic.  Nose: Nose normal.  Mouth/Throat: Oropharynx  is clear and moist.  Eyes: Conjunctivae and EOM are normal. Pupils are equal, round, and reactive to light.  Neck: Normal range of motion.  Cardiovascular: Regular rhythm.   Pulmonary/Chest: Effort normal. No respiratory distress. He has no wheezes.  Abdominal: Soft.  Musculoskeletal:       Right shoulder: He exhibits decreased range of motion, tenderness and bony tenderness.       Lumbar back: He exhibits decreased range of motion, tenderness, bony tenderness and pain.       Good flexibility in the lumbar spine., does have tenderness with flexion and extension today, but no real distress seen with activity.  Neurological: He is alert and oriented to person, place, and time.  Psychiatric: He has a normal mood and affect. His behavior is normal. Judgment and thought content normal.          Assessment & Plan:  ASSESSMENT:  1. Lumbar spondylosis.  2. Lumbar facet disease.  PLAN:  1. I reinforced regular  stretching and posture maintenance. He will have occasional "bad" days, but he has to learn to work through these. I don't see any change in his exam today.  2. I refilled his Opana ER 40 mg q.12 h and oxycodone IR 10 mg one q.6  h p.r.n., #90  3. I will see the patient back in 3 months with 51-month PA  followup.

## 2011-05-30 ENCOUNTER — Other Ambulatory Visit: Payer: Self-pay | Admitting: *Deleted

## 2011-05-30 MED ORDER — AMITRIPTYLINE HCL 25 MG PO TABS: 25.0000 mg | ORAL_TABLET | Freq: Every day | ORAL | Status: DC

## 2011-06-23 ENCOUNTER — Other Ambulatory Visit: Payer: Self-pay

## 2011-06-23 ENCOUNTER — Encounter: Payer: Self-pay | Admitting: Physical Medicine and Rehabilitation

## 2011-06-23 ENCOUNTER — Encounter: Payer: Medicare Other | Attending: Physical Medicine & Rehabilitation | Admitting: Physical Medicine and Rehabilitation

## 2011-06-23 VITALS — BP 141/52 | HR 79 | Resp 16 | Ht 71.0 in | Wt 173.0 lb

## 2011-06-23 DIAGNOSIS — M47817 Spondylosis without myelopathy or radiculopathy, lumbosacral region: Secondary | ICD-10-CM | POA: Insufficient documentation

## 2011-06-23 DIAGNOSIS — G8929 Other chronic pain: Secondary | ICD-10-CM | POA: Insufficient documentation

## 2011-06-23 DIAGNOSIS — M545 Low back pain, unspecified: Secondary | ICD-10-CM | POA: Insufficient documentation

## 2011-06-23 DIAGNOSIS — R509 Fever, unspecified: Secondary | ICD-10-CM | POA: Insufficient documentation

## 2011-06-23 DIAGNOSIS — M47816 Spondylosis without myelopathy or radiculopathy, lumbar region: Secondary | ICD-10-CM

## 2011-06-23 DIAGNOSIS — M79609 Pain in unspecified limb: Secondary | ICD-10-CM | POA: Insufficient documentation

## 2011-06-23 MED ORDER — OXYCODONE HCL 10 MG PO TABS: 10.0000 mg | ORAL_TABLET | Freq: Four times a day (QID) | ORAL | Status: DC | PRN

## 2011-06-23 MED ORDER — PREGABALIN 150 MG PO CAPS: 150.0000 mg | ORAL_CAPSULE | Freq: Three times a day (TID) | ORAL | Status: DC

## 2011-06-23 MED ORDER — OXYMORPHONE HCL ER 40 MG PO TB12: 40.0000 mg | ORAL_TABLET | Freq: Two times a day (BID) | ORAL | Status: DC

## 2011-06-23 NOTE — Progress Notes (Deleted)
Addended by: Judd Gaudier on: 06/23/2011 01:34 PM   Modules accepted: Orders

## 2011-06-23 NOTE — Patient Instructions (Signed)
Continue with stretching and strengthening exercises. Continue with medication.

## 2011-06-23 NOTE — Progress Notes (Deleted)
Subjective:    Patient ID: Dylan Farley, male    DOB: 07/30/1955, 56 y.o.   MRN: [id number]  HPI The patient complains about chronic low back pain which radiates into the lower extremities bilaterally.  The problem has been stable, he has good and bad days.  Pain Inventory Average Pain 6 Pain Right Now 6 My pain is intermittent, sharp, dull and aching  In the last 24 hours, has pain interfered with the following? General activity 4 Relation with others 4 Enjoyment of life 5 What TIME of day is your pain at its worst? Varies Sleep (in general) Fair  Pain is worse with: bending and standing Pain improves with: rest, pacing activities and medication Relief from Meds: 7  Mobility walk without assistance how many minutes can you walk? 30-60 ability to climb steps?  yes do you drive?  yes  Function not employed: date last employed 2005 disabled: date disabled 2005  Neuro/Psych No problems in this area  Prior Studies Any changes since last visit?  no  Physicians involved in your care Any changes since last visit?  no   Family History  Problem Relation Age of Onset  . Arthritis Mother   . Diabetes Brother    History   Social History  . Marital Status: Married    Spouse Name: N/A    Number of Children: N/A  . Years of Education: N/A   Social History Main Topics  . Smoking status: Current Everyday Smoker -- 1.0 packs/day for 45 years    Types: Cigarettes  . Smokeless tobacco: None  . Alcohol Use: No  . Drug Use: No  . Sexually Active: None   Other Topics Concern  . None   Social History Narrative  . None   Past Surgical History  Procedure Date  . Knee surgery    Past Medical History  Diagnosis Date  . Spinal stenosis, lumbar region, without neurogenic claudication   . Lumbosacral spondylosis without myelopathy   . Other chronic postoperative pain   . Facet syndrome, lumbar   . Lesion of sciatic nerve   . Depressive disorder, not elsewhere  classified   . Sciatic neuritis    BP 141/52  Pulse 79  Resp 16  Ht 5' 11" (1.803 m)  Wt 173 lb (78.472 kg)  BMI 24.13 kg/m2  SpO2 97%      Review of Systems  Constitutional: Positive for fever, chills and unexpected weight change.  HENT: Negative.   Eyes: Negative.   Respiratory: Negative.   Cardiovascular: Negative.   Gastrointestinal: Negative.   Genitourinary: Negative.   Musculoskeletal: Positive for back pain.  Skin: Negative.   Neurological: Negative.   Hematological: Negative.   Psychiatric/Behavioral: Negative.        Objective:   Physical Exam  Constitutional: He is oriented to person, place, and time. He appears well-developed and well-nourished.  HENT:  Head: Normocephalic.  Neck: Neck supple.  Musculoskeletal: He exhibits tenderness.  Neurological: He is alert and oriented to person, place, and time.  Skin: Skin is warm and dry.  Psychiatric: He has a normal mood and affect.    Symmetric normal motor tone is noted throughout. Normal muscle bulk. Muscle testing reveals 5/5 muscle strength of the upper extremity, and 5/5 of the lower extremity, except right iliopsoas, 4/5 . Full range of motion in upper and lower extremities. ROM of spine is  restricted. Fine motor movements are normal in both hands.  DTR in the upper and lower extremity   are present and symmetric 2+. No clonus is noted.  Patient arises from chair without difficulty. Narrow based gait with normal arm swing bilateral , able to walk on heels and toes . Tandem walk is stable. No pronator drift. Rhomberg negative.        Assessment & Plan:  1. Lumbar spondylosis.  2. Lumbar facet disease.  PLAN:  1. I reinforced regular  stretching and posture maintenance. He will have occasional "bad" days, but he has to learn to work through these. I don't see any change in his exam today.  2. I refilled his Opana ER 40 mg q.12 h and oxycodone IR 10 mg one q.8  h p.r.n., #90  3. I will see the  patient back 1-month PA    

## 2011-07-09 ENCOUNTER — Other Ambulatory Visit: Payer: Self-pay | Admitting: *Deleted

## 2011-07-09 MED ORDER — CYCLOBENZAPRINE HCL 10 MG PO TABS: 10.0000 mg | ORAL_TABLET | Freq: Three times a day (TID) | ORAL | Status: DC | PRN

## 2011-07-23 ENCOUNTER — Encounter: Payer: Medicare Other | Attending: Physical Medicine & Rehabilitation | Admitting: Physical Medicine & Rehabilitation

## 2011-07-23 ENCOUNTER — Encounter: Payer: Self-pay | Admitting: Physical Medicine & Rehabilitation

## 2011-07-23 VITALS — BP 144/84 | HR 80 | Resp 14 | Ht 71.0 in | Wt 173.0 lb

## 2011-07-23 DIAGNOSIS — M47816 Spondylosis without myelopathy or radiculopathy, lumbar region: Secondary | ICD-10-CM

## 2011-07-23 DIAGNOSIS — M47817 Spondylosis without myelopathy or radiculopathy, lumbosacral region: Secondary | ICD-10-CM | POA: Insufficient documentation

## 2011-07-23 MED ORDER — OXYCODONE HCL 10 MG PO TABS: 10.0000 mg | ORAL_TABLET | Freq: Four times a day (QID) | ORAL | Status: DC | PRN

## 2011-07-23 MED ORDER — OXYMORPHONE HCL ER 40 MG PO TB12: 40.0000 mg | ORAL_TABLET | Freq: Two times a day (BID) | ORAL | Status: DC

## 2011-07-23 NOTE — Patient Instructions (Signed)
YOU NEED TO COMMIT TO A REGULAR STRETCHING PROGRAM EVERY DAY!!!!!

## 2011-07-23 NOTE — Progress Notes (Signed)
Subjective:    Patient ID: Dylan Farley, male    DOB: 02/17/1955, 56 y.o.   MRN: [id number]  HPI  Elijah Birk is back regarding his low back pain. His pain has been about the same. He feels that bending over and working in the yard may bother his back more than it used to. He remains on his opana and oxycodone for breakthrough pain. He's not using hte lidoderm as much as he did before.  He remains on prilosec for GERD. His bowels and bladder have been controlled. He does his stretches daily. He spends a few seconds every other day stretching.   Pain Inventory Average Pain 6 Pain Right Now 6 My pain is intermittent, dull and aching  In the last 24 hours, has pain interfered with the following? General activity 5 Relation with others 5 Enjoyment of life 5 What TIME of day is your pain at its worst? morning Sleep (in general) Fair  Pain is worse with: bending, sitting and standing Pain improves with: rest, pacing activities and medication Relief from Meds: 7  Mobility walk without assistance ability to climb steps?  yes do you drive?  yes Do you have any goals in this area?  no  Function disabled: date disabled 06/13/2003 Do you have any goals in this area?  no  Neuro/Psych weakness  Prior Studies Any changes since last visit?  no  Physicians involved in your care Any changes since last visit?  no   Family History  Problem Relation Age of Onset  . Arthritis Mother   . Diabetes Brother    History   Social History  . Marital Status: Married    Spouse Name: N/A    Number of Children: N/A  . Years of Education: N/A   Social History Main Topics  . Smoking status: Current Everyday Smoker -- 1.0 packs/day for 45 years    Types: Cigarettes  . Smokeless tobacco: Not on file  . Alcohol Use: No  . Drug Use: No  . Sexually Active: Not on file   Other Topics Concern  . Not on file   Social History Narrative  . No narrative on file   Past Surgical History  Procedure  Date  . Knee surgery    Past Medical History  Diagnosis Date  . Spinal stenosis, lumbar region, without neurogenic claudication   . Lumbosacral spondylosis without myelopathy   . Other chronic postoperative pain   . Facet syndrome, lumbar   . Lesion of sciatic nerve   . Depressive disorder, not elsewhere classified   . Sciatic neuritis    There were no vitals taken for this visit.      Review of Systems  Neurological: Positive for weakness.  All other systems reviewed and are negative.       Objective:   Physical Exam  Constitutional: He is oriented to person, place, and time. He appears well-developed and well-nourished.  HENT:  Head: Normocephalic and atraumatic.  Nose: Nose normal.  Mouth/Throat: Oropharynx is clear and moist.  Eyes: Conjunctivae and EOM are normal. Pupils are equal, round, and reactive to light.  Neck: Normal range of motion.  Cardiovascular: Regular rhythm.  Pulmonary/Chest: Effort normal. No respiratory distress. He has no wheezes.  Abdominal: Soft.  Musculoskeletal:  Right shoulder: He exhibits decreased range of motion, tenderness and bony tenderness.  Lumbar back: He exhibits decreased range of motion, tenderness, bony tenderness and pain.  Good flexibility in the lumbar spine., does have tenderness with flexion and  extension today, but no real distress seen with activity. His hamstrings and calves are both tight and tender with heel cord stretch and lumbar flexion.   Neurological: He is alert and oriented to person, place, and time.  Psychiatric: He has a normal mood and affect. His behavior is normal. Judgment and thought content normal.    Assessment & Plan:   ASSESSMENT:  1. Lumbar spondylosis.  2. Lumbar facet disease.   PLAN:  1. I reinforced regular  stretching and posture maintenance. I think that if he buys into a regular stretching program and makes a commitment to it, his pain will decrease quite a bit. I think he could come  down on his narcotics as well. 2. I refilled his Opana ER 40 mg q.12 h and oxycodone IR 10 mg one q.6  h p.r.n., #90  3. Follow up with my PA next month.

## 2011-07-29 ENCOUNTER — Other Ambulatory Visit: Payer: Self-pay | Admitting: *Deleted

## 2011-07-29 MED ORDER — OMEPRAZOLE 20 MG PO CPDR: 20.0000 mg | DELAYED_RELEASE_CAPSULE | Freq: Every day | ORAL | Status: DC

## 2011-08-05 NOTE — Progress Notes (Signed)
Subjective:    Patient ID: Dylan Farley, male    DOB: [birth year], 56 y.o.   MRN: [id number]  HPI The patient complains about chronic low back pain which radiates into the lower extremities bilaterally.  The problem has been stable, he has good and bad days.  Pain Inventory Average Pain 6 Pain Right Now 6 My pain is intermittent, sharp, dull and aching  In the last 24 hours, has pain interfered with the following? General activity 4 Relation with others 4 Enjoyment of life 5 What TIME of day is your pain at its worst? Varies Sleep (in general) Fair  Pain is worse with: bending and standing Pain improves with: rest, pacing activities and medication Relief from Meds: 7  Mobility walk without assistance how many minutes can you walk? 30-60 ability to climb steps?  yes do you drive?  yes  Function not employed: date last employed 2005 disabled: date disabled 2005  Neuro/Psych No problems in this area  Prior Studies Any changes since last visit?  no  Physicians involved in your care Any changes since last visit?  no   Family History  Problem Relation Age of Onset  . Arthritis Mother   . Diabetes Brother    History   Social History  . Marital Status: Married    Spouse Name: N/A    Number of Children: N/A  . Years of Education: N/A   Social History Main Topics  . Smoking status: Current Everyday Smoker -- 1.0 packs/day for 45 years    Types: Cigarettes  . Smokeless tobacco: None  . Alcohol Use: No  . Drug Use: No  . Sexually Active: None   Other Topics Concern  . None   Social History Narrative  . None   Past Surgical History  Procedure Date  . Knee surgery    Past Medical History  Diagnosis Date  . Spinal stenosis, lumbar region, without neurogenic claudication   . Lumbosacral spondylosis without myelopathy   . Other chronic postoperative pain   . Facet syndrome, lumbar   . Lesion of sciatic nerve   . Depressive disorder, not elsewhere  classified   . Sciatic neuritis    BP 141/52  Pulse 79  Resp 16  Ht 5\' 11"  (1.803 m)  Wt 173 lb (78.472 kg)  BMI 24.13 kg/m2  SpO2 97%      Review of Systems  Constitutional: Positive for fever, chills and unexpected weight change.  HENT: Negative.   Eyes: Negative.   Respiratory: Negative.   Cardiovascular: Negative.   Gastrointestinal: Negative.   Genitourinary: Negative.   Musculoskeletal: Positive for back pain.  Skin: Negative.   Neurological: Negative.   Hematological: Negative.   Psychiatric/Behavioral: Negative.        Objective:   Physical Exam  Constitutional: He is oriented to person, place, and time. He appears well-developed and well-nourished.  HENT:  Head: Normocephalic.  Neck: Neck supple.  Musculoskeletal: He exhibits tenderness.  Neurological: He is alert and oriented to person, place, and time.  Skin: Skin is warm and dry.  Psychiatric: He has a normal mood and affect.    Symmetric normal motor tone is noted throughout. Normal muscle bulk. Muscle testing reveals 5/5 muscle strength of the upper extremity, and 5/5 of the lower extremity, except right iliopsoas, 4/5 . Full range of motion in upper and lower extremities. ROM of spine is  restricted. Fine motor movements are normal in both hands.  DTR in the upper and lower extremity  are present and symmetric 2+. No clonus is noted.  Patient arises from chair without difficulty. Narrow based gait with normal arm swing bilateral , able to walk on heels and toes . Tandem walk is stable. No pronator drift. Rhomberg negative.        Assessment & Plan:  1. Lumbar spondylosis.  2. Lumbar facet disease.  PLAN:  1. I reinforced regular  stretching and posture maintenance. He will have occasional "bad" days, but he has to learn to work through these. I don't see any change in his exam today.  2. I refilled his Opana ER 40 mg q.12 h and oxycodone IR 10 mg one q.8  h p.r.n., #90  3. I will see the  patient back 21-month PA

## 2011-08-20 ENCOUNTER — Encounter
Payer: Medicare Other | Attending: Physical Medicine and Rehabilitation | Admitting: Physical Medicine and Rehabilitation

## 2011-08-20 ENCOUNTER — Encounter: Payer: Self-pay | Admitting: Physical Medicine and Rehabilitation

## 2011-08-20 VITALS — BP 148/82 | HR 74 | Resp 14 | Ht 71.0 in | Wt 172.0 lb

## 2011-08-20 DIAGNOSIS — G8929 Other chronic pain: Secondary | ICD-10-CM | POA: Insufficient documentation

## 2011-08-20 DIAGNOSIS — M545 Low back pain, unspecified: Secondary | ICD-10-CM | POA: Insufficient documentation

## 2011-08-20 DIAGNOSIS — M47816 Spondylosis without myelopathy or radiculopathy, lumbar region: Secondary | ICD-10-CM

## 2011-08-20 DIAGNOSIS — M47817 Spondylosis without myelopathy or radiculopathy, lumbosacral region: Secondary | ICD-10-CM | POA: Insufficient documentation

## 2011-08-20 DIAGNOSIS — M79609 Pain in unspecified limb: Secondary | ICD-10-CM | POA: Insufficient documentation

## 2011-08-20 MED ORDER — OXYCODONE HCL 10 MG PO TABS: 10.0000 mg | ORAL_TABLET | Freq: Four times a day (QID) | ORAL | Status: DC | PRN

## 2011-08-20 MED ORDER — OXYMORPHONE HCL ER 40 MG PO TB12: 40.0000 mg | ORAL_TABLET | Freq: Two times a day (BID) | ORAL | Status: DC

## 2011-08-20 NOTE — Progress Notes (Signed)
Subjective:    Patient ID: Dylan Farley, male    DOB: 05/19/1955, 56 y.o.   MRN: [id number]  HPI The patient complains about chronic low back pain which radiates into the lower extremities bilaterally.  The problem has been stable, he has good and bad days.   Pain Inventory Average Pain 6 Pain Right Now 6 My pain is intermittent, sharp, burning, dull, stabbing and aching  In the last 24 hours, has pain interfered with the following? General activity 4 Relation with others 5 Enjoyment of life 4 What TIME of day is your pain at its worst? morning Sleep (in general) Fair  Pain is worse with: sitting and standing Pain improves with: rest, heat/ice and medication Relief from Meds: 7  Mobility walk without assistance ability to climb steps?  yes do you drive?  yes Do you have any goals in this area?  no  Function not employed: date last employed 2005 Do you have any goals in this area?  no  Neuro/Psych weakness numbness  Prior Studies Any changes since last visit?  no  Physicians involved in your care Any changes since last visit?  no   Family History  Problem Relation Age of Onset  . Arthritis Mother   . Diabetes Brother    History   Social History  . Marital Status: Married    Spouse Name: N/A    Number of Children: N/A  . Years of Education: N/A   Social History Main Topics  . Smoking status: Current Everyday Smoker -- 1.0 packs/day for 45 years    Types: Cigarettes  . Smokeless tobacco: None  . Alcohol Use: No  . Drug Use: No  . Sexually Active: None   Other Topics Concern  . None   Social History Narrative  . None   Past Surgical History  Procedure Date  . Knee surgery    Past Medical History  Diagnosis Date  . Spinal stenosis, lumbar region, without neurogenic claudication   . Lumbosacral spondylosis without myelopathy   . Other chronic postoperative pain   . Facet syndrome, lumbar   . Lesion of sciatic nerve   . Depressive  disorder, not elsewhere classified   . Sciatic neuritis    BP 148/82  Pulse 74  Resp 14  Ht 5\' 11"  (1.803 m)  Wt 172 lb (78.019 kg)  BMI 23.99 kg/m2  SpO2 98%      Review of Systems  Constitutional: Positive for diaphoresis and unexpected weight change.  Musculoskeletal: Positive for back pain.  Neurological: Positive for weakness and numbness.  All other systems reviewed and are negative.       Objective:   Physical Exam Constitutional: He is oriented to person, place, and time. He appears well-developed and well-nourished.  HENT:  Head: Normocephalic.  Neck: Neck supple.  Musculoskeletal: He exhibits tenderness.  Neurological: He is alert and oriented to person, place, and time.  Skin: Skin is warm and dry.  Psychiatric: He has a normal mood and affect.   Symmetric normal motor tone is noted throughout. Normal muscle bulk. Muscle testing reveals 5/5 muscle strength of the upper extremity, and 5/5 of the lower extremity, except right iliopsoas, 4/5 . Full range of motion in upper and lower extremities. ROM of spine is restricted. Fine motor movements are normal in both hands.  DTR in the upper and lower extremity are present and symmetric 2+. No clonus is noted.  Patient arises from chair without difficulty. Narrow based gait with normal  arm swing bilateral , able to walk on heels and toes . Tandem walk is stable. No pronator drift. Rhomberg negative.        Assessment & Plan:  1. Lumbar spondylosis.  2. Lumbar facet disease.  PLAN:  1. I reinforced regular stretching, walking and posture maintenance. He will have occasional "bad" days, but he has to learn to work through these. I don't see any change in his exam today.  2. I refilled his Opana ER 40 mg q.12 h and oxycodone IR 10 mg one q.8  h p.r.n., #90  3. I will see the patient back 46-month PA

## 2011-08-20 NOTE — Patient Instructions (Signed)
Continue with stretching exercises, continue with walking with your dog.

## 2011-09-02 ENCOUNTER — Other Ambulatory Visit: Payer: Self-pay | Admitting: *Deleted

## 2011-09-02 MED ORDER — AMITRIPTYLINE HCL 25 MG PO TABS: 25.0000 mg | ORAL_TABLET | Freq: Every day | ORAL | Status: DC

## 2011-09-17 ENCOUNTER — Encounter
Payer: Medicare Other | Attending: Physical Medicine and Rehabilitation | Admitting: Physical Medicine and Rehabilitation

## 2011-09-17 ENCOUNTER — Encounter: Payer: Self-pay | Admitting: Physical Medicine and Rehabilitation

## 2011-09-17 VITALS — BP 150/80 | HR 81 | Resp 16 | Ht 71.0 in | Wt 167.0 lb

## 2011-09-17 DIAGNOSIS — G8929 Other chronic pain: Secondary | ICD-10-CM | POA: Insufficient documentation

## 2011-09-17 DIAGNOSIS — M545 Low back pain, unspecified: Secondary | ICD-10-CM | POA: Insufficient documentation

## 2011-09-17 DIAGNOSIS — M47816 Spondylosis without myelopathy or radiculopathy, lumbar region: Secondary | ICD-10-CM

## 2011-09-17 DIAGNOSIS — M79609 Pain in unspecified limb: Secondary | ICD-10-CM | POA: Insufficient documentation

## 2011-09-17 DIAGNOSIS — M47817 Spondylosis without myelopathy or radiculopathy, lumbosacral region: Secondary | ICD-10-CM | POA: Insufficient documentation

## 2011-09-17 MED ORDER — OXYCODONE HCL 10 MG PO TABS: 10.0000 mg | ORAL_TABLET | Freq: Four times a day (QID) | ORAL | Status: DC | PRN

## 2011-09-17 MED ORDER — OXYMORPHONE HCL ER 40 MG PO TB12: 40.0000 mg | ORAL_TABLET | Freq: Two times a day (BID) | ORAL | Status: DC

## 2011-09-17 NOTE — Patient Instructions (Signed)
Continue with walking program...

## 2011-09-17 NOTE — Progress Notes (Signed)
Subjective:    Patient ID: Dylan Farley, male    DOB: 09-16-1955, 56 y.o.   MRN: [id number]  HPI The patient complains about chronic low back pain which radiates into the lower extremities bilaterally.  The problem has been stable, he has good and bad days.   Pain Inventory Average Pain 5 Pain Right Now 5 My pain is intermittent, burning, dull and aching  In the last 24 hours, has pain interfered with the following? General activity 5 Relation with others 5 Enjoyment of life 5 What TIME of day is your pain at its worst? morning Sleep (in general) Fair  Pain is worse with: bending and standing Pain improves with: rest, pacing activities and medication Relief from Meds: 7  Mobility walk without assistance how many minutes can you walk? 30 ability to climb steps?  yes do you drive?  yes  Function disabled: date disabled 2005  Neuro/Psych weakness numbness  Prior Studies Any changes since last visit?  no  Physicians involved in your care Any changes since last visit?  no   Family History  Problem Relation Age of Onset  . Arthritis Mother   . Diabetes Brother    History   Social History  . Marital Status: Married    Spouse Name: N/A    Number of Children: N/A  . Years of Education: N/A   Social History Main Topics  . Smoking status: Current Everyday Smoker -- 1.0 packs/day for 45 years    Types: Cigarettes  . Smokeless tobacco: None  . Alcohol Use: No  . Drug Use: No  . Sexually Active: None   Other Topics Concern  . None   Social History Narrative  . None   Past Surgical History  Procedure Date  . Knee surgery    Past Medical History  Diagnosis Date  . Spinal stenosis, lumbar region, without neurogenic claudication   . Lumbosacral spondylosis without myelopathy   . Other chronic postoperative pain   . Facet syndrome, lumbar   . Lesion of sciatic nerve   . Depressive disorder, not elsewhere classified   . Sciatic neuritis    BP 150/80   Pulse 81  Resp 16  Ht 5\' 11"  (1.803 m)  Wt 167 lb (75.751 kg)  BMI 23.29 kg/m2  SpO2 100%      Review of Systems  Constitutional: Positive for fever, chills and unexpected weight change.  HENT: Negative.   Eyes: Negative.   Respiratory: Negative.   Cardiovascular: Negative.   Gastrointestinal: Negative.   Genitourinary: Negative.   Musculoskeletal: Positive for back pain.  Skin: Negative.   Neurological: Positive for weakness and numbness.  Hematological: Negative.   Psychiatric/Behavioral: Negative.        Objective:   Physical Exam Constitutional: He is oriented to person, place, and time. He appears well-developed and well-nourished.  HENT:  Head: Normocephalic.  Neck: Neck supple.  Musculoskeletal: He exhibits tenderness.  Neurological: He is alert and oriented to person, place, and time.  Skin: Skin is warm and dry.  Psychiatric: He has a normal mood and affect.  Symmetric normal motor tone is noted throughout. Normal muscle bulk. Muscle testing reveals 5/5 muscle strength of the upper extremity, and 5/5 of the lower extremity, except right iliopsoas, 4/5 . Full range of motion in upper and lower extremities. ROM of spine is restricted. Fine motor movements are normal in both hands.  DTR in the upper and lower extremity are present and symmetric 2+. No clonus is noted.  Patient arises from chair without difficulty. Narrow based gait with normal arm swing bilateral , able to walk on heels and toes . Tandem walk is stable. No pronator drift. Rhomberg negative.        Assessment & Plan:  1. Lumbar spondylosis.  2. Lumbar facet disease.  PLAN:  1. I reinforced regular stretching, walking and posture maintenance. He will have occasional "bad" days, but he has to learn to work through these. Advised patient to continue with his walking program. I don't see any change in his exam today.  2. I refilled his Opana ER 40 mg q.12 h and oxycodone IR 10 mg one q.8  h  p.r.n., #90  3.  see the patient back 71-month PA

## 2011-10-14 ENCOUNTER — Encounter
Payer: Medicare Other | Attending: Physical Medicine and Rehabilitation | Admitting: Physical Medicine and Rehabilitation

## 2011-10-14 VITALS — BP 154/77 | HR 88 | Resp 16 | Ht 71.0 in | Wt 172.0 lb

## 2011-10-14 DIAGNOSIS — G8929 Other chronic pain: Secondary | ICD-10-CM | POA: Insufficient documentation

## 2011-10-14 DIAGNOSIS — M79609 Pain in unspecified limb: Secondary | ICD-10-CM | POA: Insufficient documentation

## 2011-10-14 DIAGNOSIS — M545 Low back pain, unspecified: Secondary | ICD-10-CM | POA: Insufficient documentation

## 2011-10-14 DIAGNOSIS — M47817 Spondylosis without myelopathy or radiculopathy, lumbosacral region: Secondary | ICD-10-CM | POA: Insufficient documentation

## 2011-10-14 DIAGNOSIS — M47816 Spondylosis without myelopathy or radiculopathy, lumbar region: Secondary | ICD-10-CM

## 2011-10-14 MED ORDER — OXYCODONE HCL 10 MG PO TABS: 10.0000 mg | ORAL_TABLET | Freq: Four times a day (QID) | ORAL | Status: DC | PRN

## 2011-10-14 MED ORDER — OXYMORPHONE HCL ER 40 MG PO TB12: 40.0000 mg | ORAL_TABLET | Freq: Two times a day (BID) | ORAL | Status: DC

## 2011-10-14 NOTE — Progress Notes (Signed)
Subjective:    Patient ID: Dylan Farley, male    DOB: 1955-10-14, 56 y.o.   MRN: [id number]  HPI The patient complains about chronic low back pain which radiates into the lower extremities bilaterally.  The problem has been stable, he has good and bad days. The patient reports, that he walks regulary and does his exercises.  Pain Inventory Average Pain 6 Pain Right Now 7 My pain is burning and aching  In the last 24 hours, has pain interfered with the following? General activity 5 Relation with others 5 Enjoyment of life 5 What TIME of day is your pain at its worst? morning Sleep (in general) Good  Pain is worse with: bending, sitting and standing Pain improves with: rest, heat/ice, pacing activities and medication Relief from Meds: 7  Mobility walk without assistance ability to climb steps?  yes do you drive?  yes  Function not employed: date last employed 2005  Neuro/Psych No problems in this area  Prior Studies Any changes since last visit?  no  Physicians involved in your care Any changes since last visit?  no   Family History  Problem Relation Age of Onset  . Arthritis Mother   . Diabetes Brother    History   Social History  . Marital Status: Married    Spouse Name: N/A    Number of Children: N/A  . Years of Education: N/A   Social History Main Topics  . Smoking status: Current Every Day Smoker -- 1.0 packs/day for 45 years    Types: Cigarettes  . Smokeless tobacco: Not on file  . Alcohol Use: No  . Drug Use: No  . Sexually Active: Not on file   Other Topics Concern  . Not on file   Social History Narrative  . No narrative on file   Past Surgical History  Procedure Date  . Knee surgery    Past Medical History  Diagnosis Date  . Spinal stenosis, lumbar region, without neurogenic claudication   . Lumbosacral spondylosis without myelopathy   . Other chronic postoperative pain   . Facet syndrome, lumbar   . Lesion of sciatic nerve   .  Depressive disorder, not elsewhere classified   . Sciatic neuritis    BP 154/77  Pulse 88  Resp 16  Ht 5\' 11"  (1.803 m)  Wt 172 lb (78.019 kg)  BMI 23.99 kg/m2  SpO2 99%     Review of Systems  Constitutional: Positive for fever, chills and unexpected weight change.  HENT: Negative.   Eyes: Negative.   Respiratory: Negative.   Cardiovascular: Negative.   Gastrointestinal: Negative.   Genitourinary: Negative.   Musculoskeletal: Positive for back pain.  Skin: Negative.   Neurological: Negative.   Psychiatric/Behavioral: Negative.        Objective:   Physical Exam Constitutional: He is oriented to person, place, and time. He appears well-developed and well-nourished.  HENT:  Head: Normocephalic.  Neck: Neck supple.  Musculoskeletal: He exhibits tenderness.  Neurological: He is alert and oriented to person, place, and time.  Skin: Skin is warm and dry.  Psychiatric: He has a normal mood and affect.  Symmetric normal motor tone is noted throughout. Normal muscle bulk. Muscle testing reveals 5/5 muscle strength of the upper extremity, and 5/5 of the lower extremity, except right iliopsoas, 4/5 . Full range of motion in upper and lower extremities. ROM of spine is restricted. Fine motor movements are normal in both hands.  DTR in the upper and lower  extremity are present and symmetric 2+. No clonus is noted.  Patient arises from chair without difficulty. Narrow based gait with normal arm swing bilateral , able to walk on heels and toes . Tandem walk is stable. No pronator drift. Rhomberg negative.        Assessment & Plan:  1. Lumbar spondylosis.  2. Lumbar facet disease.  PLAN:  1. I reinforced regular stretching, walking and posture maintenance. He will have occasional "bad" days, but he has to learn to work through these. Advised patient to continue with his walking program. I don't see any change in his exam today.  2. I refilled his Opana ER 40 mg q.12 h and oxycodone  IR 10 mg one q.8  h p.r.n., #90  3. see the patient back 29-month PA

## 2011-10-14 NOTE — Patient Instructions (Signed)
Continue with your walking program, and with your exercises program.

## 2011-10-21 ENCOUNTER — Other Ambulatory Visit: Payer: Self-pay | Admitting: *Deleted

## 2011-10-21 MED ORDER — PREGABALIN 150 MG PO CAPS: 150.0000 mg | ORAL_CAPSULE | Freq: Three times a day (TID) | ORAL | Status: AC

## 2011-11-11 ENCOUNTER — Encounter
Payer: Medicare Other | Attending: Physical Medicine and Rehabilitation | Admitting: Physical Medicine and Rehabilitation

## 2011-11-11 ENCOUNTER — Encounter: Payer: Self-pay | Admitting: Physical Medicine and Rehabilitation

## 2011-11-11 VITALS — BP 159/91 | HR 67 | Resp 14 | Ht 70.0 in | Wt 152.0 lb

## 2011-11-11 DIAGNOSIS — M47816 Spondylosis without myelopathy or radiculopathy, lumbar region: Secondary | ICD-10-CM

## 2011-11-11 DIAGNOSIS — M47817 Spondylosis without myelopathy or radiculopathy, lumbosacral region: Secondary | ICD-10-CM

## 2011-11-11 DIAGNOSIS — M545 Low back pain, unspecified: Secondary | ICD-10-CM

## 2011-11-11 DIAGNOSIS — M79609 Pain in unspecified limb: Secondary | ICD-10-CM | POA: Insufficient documentation

## 2011-11-11 DIAGNOSIS — G8929 Other chronic pain: Secondary | ICD-10-CM | POA: Insufficient documentation

## 2011-11-11 MED ORDER — OXYMORPHONE HCL ER 40 MG PO TB12: 40.0000 mg | ORAL_TABLET | Freq: Two times a day (BID) | ORAL | Status: DC

## 2011-11-11 MED ORDER — OXYCODONE HCL 10 MG PO TABS: 10.0000 mg | ORAL_TABLET | Freq: Four times a day (QID) | ORAL | Status: DC | PRN

## 2011-11-11 NOTE — Patient Instructions (Signed)
Restart your walking and exercise program, when you feel healthy again.

## 2011-11-11 NOTE — Progress Notes (Signed)
Subjective:    Patient ID: Dylan Farley, male    DOB: 12-11-1955, 56 y.o.   MRN: [id number]  HPI The patient complains about chronic low back pain which radiates into the lower extremities bilaterally.  The problem has been stable, he has good and bad days. The patient reports, that he walks regulary and does his exercises, although he has not done this the last 3 weeks, because he got bronchitis and did not feel well.  Pain Inventory Average Pain 6 Pain Right Now 5 My pain is intermittent, sharp, burning, dull and aching  In the last 24 hours, has pain interfered with the following? General activity 5 Relation with others 5 Enjoyment of life 5 What TIME of day is your pain at its worst? varies Sleep (in general) Good  Pain is worse with: bending, sitting and standing Pain improves with: rest, heat/ice, pacing activities and medication Relief from Meds: 7  Mobility walk without assistance how many minutes can you walk? 30 ability to climb steps?  yes do you drive?  yes Do you have any goals in this area?  no  Function not employed: date last employed 5/05 disabled: date disabled 05/05 Do you have any goals in this area?  no  Neuro/Psych weakness loss of taste or smell  Prior Studies Any changes since last visit?  no  Physicians involved in your care Any changes since last visit?  no   Family History  Problem Relation Age of Onset  . Arthritis Mother   . Diabetes Brother    History   Social History  . Marital Status: Married    Spouse Name: N/A    Number of Children: N/A  . Years of Education: N/A   Social History Main Topics  . Smoking status: Current Some Day Smoker -- 1.0 packs/day for 45 years    Types: Cigarettes  . Smokeless tobacco: None  . Alcohol Use: No  . Drug Use: No  . Sexually Active: None   Other Topics Concern  . None   Social History Narrative  . None   Past Surgical History  Procedure Date  . Knee surgery    Past Medical  History  Diagnosis Date  . Spinal stenosis, lumbar region, without neurogenic claudication   . Lumbosacral spondylosis without myelopathy   . Other chronic postoperative pain   . Facet syndrome, lumbar   . Lesion of sciatic nerve   . Depressive disorder, not elsewhere classified   . Sciatic neuritis    BP 159/91  Pulse 67  Resp 14  Ht 5\' 10"  (1.778 m)  Wt 152 lb (68.947 kg)  BMI 21.81 kg/m2  SpO2 99%     Review of Systems  Constitutional: Positive for fever, chills, diaphoresis and appetite change.  Musculoskeletal: Positive for myalgias, back pain and arthralgias.  Neurological: Positive for weakness.  All other systems reviewed and are negative.       Objective:   Physical Exam Constitutional: He is oriented to person, place, and time. He appears well-developed and well-nourished.  HENT:  Head: Normocephalic.  Neck: Neck supple.  Musculoskeletal: He exhibits tenderness.  Neurological: He is alert and oriented to person, place, and time.  Skin: Skin is warm and dry.  Psychiatric: He has a normal mood and affect.  Symmetric normal motor tone is noted throughout. Normal muscle bulk. Muscle testing reveals 5/5 muscle strength of the upper extremity, and 5/5 of the lower extremity, except right iliopsoas, 4/5 . Full range of motion  in upper and lower extremities. ROM of spine is restricted. Fine motor movements are normal in both hands.  DTR in the upper and lower extremity are present and symmetric 2+. No clonus is noted.  Patient arises from chair without difficulty. Narrow based gait with normal arm swing bilateral , able to walk on heels and toes . Tandem walk is stable. No pronator drift. Rhomberg negative.        Assessment & Plan:  1. Lumbar spondylosis.  2. Lumbar facet disease.  PLAN:  1. I reinforced regular stretching, walking and posture maintenance. He will have occasional "bad" days, but he has to learn to work through these. Advised patient to continue  with his walking program. I don't see any change in his exam today.  2. I refilled his Opana ER 40 mg q.12 h and oxycodone IR 10 mg one q.8  h p.r.n., #90  3. see the patient back 38-month PA  Advised patient to take his BP at home 3 times a day for 3 days, if it is as high as in our office, he should follow up with his PCP on this.

## 2011-12-08 ENCOUNTER — Other Ambulatory Visit: Payer: Self-pay | Admitting: *Deleted

## 2011-12-08 MED ORDER — CYCLOBENZAPRINE HCL 10 MG PO TABS: 10.0000 mg | ORAL_TABLET | Freq: Three times a day (TID) | ORAL | Status: DC | PRN

## 2011-12-10 ENCOUNTER — Encounter
Payer: Medicare Other | Attending: Physical Medicine and Rehabilitation | Admitting: Physical Medicine and Rehabilitation

## 2011-12-10 ENCOUNTER — Encounter: Payer: Self-pay | Admitting: Physical Medicine and Rehabilitation

## 2011-12-10 VITALS — BP 171/96 | HR 46 | Resp 14 | Ht 71.0 in | Wt 169.0 lb

## 2011-12-10 DIAGNOSIS — M47816 Spondylosis without myelopathy or radiculopathy, lumbar region: Secondary | ICD-10-CM

## 2011-12-10 DIAGNOSIS — M545 Low back pain, unspecified: Secondary | ICD-10-CM | POA: Insufficient documentation

## 2011-12-10 DIAGNOSIS — M47817 Spondylosis without myelopathy or radiculopathy, lumbosacral region: Secondary | ICD-10-CM | POA: Insufficient documentation

## 2011-12-10 DIAGNOSIS — G8929 Other chronic pain: Secondary | ICD-10-CM | POA: Insufficient documentation

## 2011-12-10 DIAGNOSIS — M549 Dorsalgia, unspecified: Secondary | ICD-10-CM

## 2011-12-10 DIAGNOSIS — M79609 Pain in unspecified limb: Secondary | ICD-10-CM | POA: Insufficient documentation

## 2011-12-10 DIAGNOSIS — Z5181 Encounter for therapeutic drug level monitoring: Secondary | ICD-10-CM

## 2011-12-10 MED ORDER — OXYCODONE HCL 10 MG PO TABS: 10.0000 mg | ORAL_TABLET | Freq: Four times a day (QID) | ORAL | Status: DC | PRN

## 2011-12-10 MED ORDER — OXYMORPHONE HCL ER 40 MG PO TB12: 40.0000 mg | ORAL_TABLET | Freq: Two times a day (BID) | ORAL | Status: DC

## 2011-12-10 NOTE — Progress Notes (Signed)
Subjective:    Patient ID: Dylan Farley, male    DOB: 07/26/1955, 56 y.o.   MRN: [id number]  HPI The patient complains about chronic low back pain which radiates into the lower extremities bilaterally.  The problem has been stable, he has good and bad days. The patient reports, that he walks regulary and does his exercises .  Pain Inventory Average Pain 7 Pain Right Now 6 My pain is intermittent, sharp, burning and aching  In the last 24 hours, has pain interfered with the following? General activity 5 Relation with others 5 Enjoyment of life 5 What TIME of day is your pain at its worst? varies Sleep (in general) Good  Pain is worse with: sitting and standing Pain improves with: heat/ice, pacing activities and medication Relief from Meds: 7  Mobility walk without assistance ability to climb steps?  yes do you drive?  yes Do you have any goals in this area?  no  Function disabled: date disabled 73 Do you have any goals in this area?  no  Neuro/Psych No problems in this area  Prior Studies Any changes since last visit?  no  Physicians involved in your care Any changes since last visit?  no   Family History  Problem Relation Age of Onset  . Arthritis Mother   . Diabetes Brother    History   Social History  . Marital Status: Married    Spouse Name: N/A    Number of Children: N/A  . Years of Education: N/A   Social History Main Topics  . Smoking status: Former Smoker -- 1.0 packs/day for 45 years    Types: Cigarettes  . Smokeless tobacco: None  . Alcohol Use: No  . Drug Use: No  . Sexually Active: None   Other Topics Concern  . None   Social History Narrative  . None   Past Surgical History  Procedure Date  . Knee surgery    Past Medical History  Diagnosis Date  . Spinal stenosis, lumbar region, without neurogenic claudication   . Lumbosacral spondylosis without myelopathy   . Other chronic postoperative pain   . Facet syndrome, lumbar   .  Lesion of sciatic nerve   . Depressive disorder, not elsewhere classified   . Sciatic neuritis    BP 171/96  Pulse 46  Resp 14  Ht 5\' 11"  (1.803 m)  Wt 169 lb (76.658 kg)  BMI 23.57 kg/m2  SpO2 99%     Review of Systems  Constitutional: Positive for diaphoresis and unexpected weight change.  Musculoskeletal: Positive for back pain.  All other systems reviewed and are negative.       Objective:   Physical Exam Constitutional: He is oriented to person, place, and time. He appears well-developed and well-nourished.  HENT:  Head: Normocephalic.  Neck: Neck supple.  Musculoskeletal: He exhibits tenderness.  Neurological: He is alert and oriented to person, place, and time.  Skin: Skin is warm and dry.  Psychiatric: He has a normal mood and affect.  Symmetric normal motor tone is noted throughout. Normal muscle bulk. Muscle testing reveals 5/5 muscle strength of the upper extremity, and 5/5 of the lower extremity, except right iliopsoas, 4/5 . Full range of motion in upper and lower extremities. ROM of spine is restricted. Fine motor movements are normal in both hands.  DTR in the upper and lower extremity are present and symmetric 2+. No clonus is noted.  Patient arises from chair without difficulty. Narrow based gait with  normal arm swing bilateral , able to walk on heels and toes . Tandem walk is stable. No pronator drift. Rhomberg negative.        Assessment & Plan:  1. Lumbar spondylosis.  2. Lumbar facet disease.  PLAN:  1. I reinforced regular stretching, walking and posture maintenance. He will have occasional "bad" days, but he has to learn to work through these. Advised patient to continue with his walking program. I don't see any change in his exam today.  2. I refilled his Opana ER 40 mg q.12 h and oxycodone IR 10 mg one q.8  h p.r.n., #90  3. see the patient back 43-month PA  Advised patient to follow up with his PCP on his HTN, educated patient about the risks  of HTN, patient states, that he will follow up with PCP asap.

## 2011-12-10 NOTE — Patient Instructions (Signed)
Continue with your walking and exercise program. Follow up with your PCP for your elevated blood pressure.

## 2012-01-01 ENCOUNTER — Encounter
Payer: Medicare Other | Attending: Physical Medicine and Rehabilitation | Admitting: Physical Medicine and Rehabilitation

## 2012-01-01 ENCOUNTER — Encounter: Payer: Self-pay | Admitting: Physical Medicine and Rehabilitation

## 2012-01-01 VITALS — BP 141/70 | HR 74 | Resp 14 | Ht 71.0 in | Wt 166.2 lb

## 2012-01-01 DIAGNOSIS — M47816 Spondylosis without myelopathy or radiculopathy, lumbar region: Secondary | ICD-10-CM

## 2012-01-01 DIAGNOSIS — M47817 Spondylosis without myelopathy or radiculopathy, lumbosacral region: Secondary | ICD-10-CM | POA: Insufficient documentation

## 2012-01-01 DIAGNOSIS — G8929 Other chronic pain: Secondary | ICD-10-CM

## 2012-01-01 DIAGNOSIS — M545 Low back pain: Secondary | ICD-10-CM

## 2012-01-01 MED ORDER — OXYMORPHONE HCL ER 40 MG PO TB12: 40.0000 mg | ORAL_TABLET | Freq: Two times a day (BID) | ORAL | Status: DC

## 2012-01-01 MED ORDER — OXYCODONE HCL 10 MG PO TABS: 10.0000 mg | ORAL_TABLET | Freq: Four times a day (QID) | ORAL | Status: DC | PRN

## 2012-01-01 NOTE — Progress Notes (Signed)
Subjective:    Patient ID: Dylan Farley, male    DOB: [birth year], 56 y.o.   MRN: [id number]  HPI The patient complains about chronic low back pain which radiates into the lower extremities bilaterally.  The problem has been stable, he has good and bad days. The patient reports, that he walks regulary and does his exercises .  Pain Inventory Average Pain 6 Pain Right Now 6 My pain is intermittent, sharp, burning and aching  In the last 24 hours, has pain interfered with the following? General activity 5 Relation with others 5 Enjoyment of life 5 What TIME of day is your pain at its worst? evening Sleep (in general) Good  Pain is worse with: bending, sitting and standing Pain improves with: rest, heat/ice, pacing activities and medication Relief from Meds: 7  Mobility walk without assistance ability to climb steps?  yes do you drive?  yes  Function disabled: date disabled 2005  Neuro/Psych No problems in this area  Prior Studies Any changes since last visit?  no  Physicians involved in your care Any changes since last visit?  no   Family History  Problem Relation Age of Onset  . Arthritis Mother   . Diabetes Brother    History   Social History  . Marital Status: Married    Spouse Name: N/A    Number of Children: N/A  . Years of Education: N/A   Social History Main Topics  . Smoking status: Former Smoker -- 1.0 packs/day for 45 years    Types: Cigarettes  . Smokeless tobacco: Never Used  . Alcohol Use: No  . Drug Use: No  . Sexually Active: None   Other Topics Concern  . None   Social History Narrative  . None   Past Surgical History  Procedure Date  . Knee surgery    Past Medical History  Diagnosis Date  . Spinal stenosis, lumbar region, without neurogenic claudication   . Lumbosacral spondylosis without myelopathy   . Other chronic postoperative pain   . Facet syndrome, lumbar   . Lesion of sciatic nerve   . Depressive disorder, not  elsewhere classified   . Sciatic neuritis    BP 141/70  Pulse 74  Resp 14  Ht 5\' 11"  (1.803 m)  Wt 166 lb 3.2 oz (75.388 kg)  BMI 23.18 kg/m2  SpO2 97%    Review of Systems  Constitutional: Positive for fever, chills and unexpected weight change.  Musculoskeletal: Positive for back pain.  All other systems reviewed and are negative.       Objective:   Physical Exam Constitutional: He is oriented to person, place, and time. He appears well-developed and well-nourished.  HENT:  Head: Normocephalic.  Neck: Neck supple.  Musculoskeletal: He exhibits tenderness.  Neurological: He is alert and oriented to person, place, and time.  Skin: Skin is warm and dry.  Psychiatric: He has a normal mood and affect.  Symmetric normal motor tone is noted throughout. Normal muscle bulk. Muscle testing reveals 5/5 muscle strength of the upper extremity, and 5/5 of the lower extremity, except right iliopsoas, 4/5 . Full range of motion in upper and lower extremities. ROM of spine is restricted. Fine motor movements are normal in both hands.  DTR in the upper and lower extremity are present and symmetric 2+. No clonus is noted.  Patient arises from chair without difficulty. Narrow based gait with normal arm swing bilateral , able to walk on heels and toes . Tandem walk  is stable. No pronator drift. Rhomberg negative.        Assessment & Plan:  1. Lumbar spondylosis.  2. Lumbar facet disease.  PLAN:  1. I reinforced regular stretching, walking and posture maintenance. He will have occasional "bad" days, but he has to learn to work through these. Advised patient to continue with his walking program. I don't see any change in his exam today.  2. I refilled his Opana ER 40 mg q.12 h and oxycodone IR 10 mg one q.8  h p.r.n., #90  3. see the patient back 24-month PA  Patient had follow up with his PCP on his HTN, who keeps an eye on this , he will have another follow up beginning of January.

## 2012-01-01 NOTE — Patient Instructions (Signed)
Continue with your walking and exercise program, as pain permits.

## 2012-01-05 ENCOUNTER — Other Ambulatory Visit: Payer: Self-pay | Admitting: *Deleted

## 2012-01-05 MED ORDER — AMITRIPTYLINE HCL 25 MG PO TABS: 25.0000 mg | ORAL_TABLET | Freq: Every day | ORAL | Status: DC

## 2012-01-28 ENCOUNTER — Other Ambulatory Visit: Payer: Self-pay | Admitting: *Deleted

## 2012-01-28 ENCOUNTER — Encounter
Payer: Medicare Other | Attending: Physical Medicine and Rehabilitation | Admitting: Physical Medicine and Rehabilitation

## 2012-01-28 ENCOUNTER — Encounter: Payer: Self-pay | Admitting: Physical Medicine and Rehabilitation

## 2012-01-28 VITALS — BP 161/88 | HR 83 | Resp 14 | Ht 71.0 in | Wt 170.8 lb

## 2012-01-28 DIAGNOSIS — M545 Low back pain, unspecified: Secondary | ICD-10-CM

## 2012-01-28 DIAGNOSIS — G8929 Other chronic pain: Secondary | ICD-10-CM | POA: Insufficient documentation

## 2012-01-28 DIAGNOSIS — M47817 Spondylosis without myelopathy or radiculopathy, lumbosacral region: Secondary | ICD-10-CM

## 2012-01-28 DIAGNOSIS — M47816 Spondylosis without myelopathy or radiculopathy, lumbar region: Secondary | ICD-10-CM

## 2012-01-28 MED ORDER — OXYMORPHONE HCL ER 40 MG PO TB12: 40.0000 mg | ORAL_TABLET | Freq: Two times a day (BID) | ORAL | Status: DC

## 2012-01-28 MED ORDER — OXYCODONE HCL 10 MG PO TABS: 10.0000 mg | ORAL_TABLET | Freq: Four times a day (QID) | ORAL | Status: DC | PRN

## 2012-01-28 NOTE — Telephone Encounter (Signed)
Patient is requesting refill on Omeprazole. Last filled 11/28/11. No documentation during last OV if patient should continue. Please advise if ok to refill.

## 2012-01-28 NOTE — Patient Instructions (Signed)
Continue with walking and exercising regularly.

## 2012-01-28 NOTE — Progress Notes (Signed)
Subjective:    Patient ID: Dylan Farley, male    DOB: 1955-12-10, 57 y.o.   MRN: [id number]  HPI The patient complains about chronic low back pain which radiates into the lower extremities bilaterally.  The problem has been stable, he has good and bad days. The patient reports, that he walks regulary and does his exercises . The patient reports, that he has taken a little more of his Oxycodone, because he had to be more active, because his wife had foot surgery.  Pain Inventory Average Pain 6 Pain Right Now 5 My pain is intermittent, sharp, burning and aching  In the last 24 hours, has pain interfered with the following? General activity 5 Relation with others 5 Enjoyment of life 5 What TIME of day is your pain at its worst? varies Sleep (in general) Good  Pain is worse with: bending, sitting and standing Pain improves with: medication Relief from Meds: 7  Mobility walk without assistance ability to climb steps?  yes do you drive?  yes  Function disabled: date disabled 2005  Neuro/Psych No problems in this area  Prior Studies Any changes since last visit?  no  Physicians involved in your care Any changes since last visit?  no   Family History  Problem Relation Age of Onset  . Arthritis Mother   . Diabetes Brother    History   Social History  . Marital Status: Married    Spouse Name: N/A    Number of Children: N/A  . Years of Education: N/A   Social History Main Topics  . Smoking status: Former Smoker -- 1.0 packs/day for 45 years    Types: Cigarettes  . Smokeless tobacco: Never Used  . Alcohol Use: No  . Drug Use: No  . Sexually Active: None   Other Topics Concern  . None   Social History Narrative  . None   Past Surgical History  Procedure Date  . Knee surgery    Past Medical History  Diagnosis Date  . Spinal stenosis, lumbar region, without neurogenic claudication   . Lumbosacral spondylosis without myelopathy   . Other chronic  postoperative pain   . Facet syndrome, lumbar   . Lesion of sciatic nerve   . Depressive disorder, not elsewhere classified   . Sciatic neuritis    BP 161/88  Pulse 83  Resp 14  Ht 5\' 11"  (1.803 m)  Wt 170 lb 12.8 oz (77.474 kg)  BMI 23.82 kg/m2  SpO2 99%   Review of Systems  Constitutional: Positive for unexpected weight change.  Musculoskeletal: Positive for back pain.  All other systems reviewed and are negative.       Objective:   Physical Exam Constitutional: He is oriented to person, place, and time. He appears well-developed and well-nourished.  HENT:  Head: Normocephalic.  Neck: Neck supple.  Musculoskeletal: He exhibits tenderness.  Neurological: He is alert and oriented to person, place, and time.  Skin: Skin is warm and dry.  Psychiatric: He has a normal mood and affect.  Symmetric normal motor tone is noted throughout. Normal muscle bulk. Muscle testing reveals 5/5 muscle strength of the upper extremity, and 5/5 of the lower extremity, except right iliopsoas, 4/5 . Full range of motion in upper and lower extremities. ROM of spine is restricted. Fine motor movements are normal in both hands.  DTR in the upper and lower extremity are present and symmetric 2+. No clonus is noted.  Patient arises from chair without difficulty. Narrow based  gait with normal arm swing bilateral , able to walk on heels and toes . Tandem walk is stable. No pronator drift. Rhomberg negative.        Assessment & Plan:  1. Lumbar spondylosis.  2. Lumbar facet disease.  PLAN:  1. I reinforced regular stretching, walking and posture maintenance. He will have occasional "bad" days, but he has to learn to work through these. Advised patient to continue with his walking program. I don't see any change in his exam today.  2. I refilled his Opana ER 40 mg q.12 h and oxycodone IR 10 mg one q.8  h p.r.n., #90 . I educated the patient that he should take his medication, so that he does not run  out early. 3. see the patient back 81-month PA  Patient had follow up with his PCP on his HTN, who keeps an eye on this , he will have another follow up beginning of January again .

## 2012-01-29 ENCOUNTER — Other Ambulatory Visit: Payer: Self-pay | Admitting: *Deleted

## 2012-01-29 MED ORDER — OMEPRAZOLE 20 MG PO CPDR: 20.0000 mg | DELAYED_RELEASE_CAPSULE | Freq: Every day | ORAL | Status: AC

## 2012-01-29 NOTE — Telephone Encounter (Signed)
Melissa called from pharmacy requesting refill on omeprazole. Refilled.

## 2012-02-17 ENCOUNTER — Telehealth: Payer: Self-pay

## 2012-02-17 MED ORDER — CYCLOBENZAPRINE HCL 10 MG PO TABS: 10.0000 mg | ORAL_TABLET | Freq: Three times a day (TID) | ORAL | Status: DC | PRN

## 2012-02-17 NOTE — Telephone Encounter (Signed)
Flexeril sent to pharmacy.

## 2012-02-17 NOTE — Telephone Encounter (Signed)
Pharmacy called requesting flexeril refill for patient.

## 2012-02-25 ENCOUNTER — Encounter: Payer: Medicare Other | Admitting: Physical Medicine and Rehabilitation

## 2017-08-30 ENCOUNTER — Emergency Department (HOSPITAL_COMMUNITY): Payer: Medicare Other

## 2017-08-30 ENCOUNTER — Observation Stay (HOSPITAL_COMMUNITY)
Admission: EM | Admit: 2017-08-30 | Discharge: 2017-08-31 | Disposition: A | Discharge status: Home / Self Care | Payer: Medicare Other | Attending: Internal Medicine | Admitting: Internal Medicine

## 2017-08-30 ENCOUNTER — Encounter (HOSPITAL_COMMUNITY): Payer: Self-pay | Admitting: *Deleted

## 2017-08-30 ENCOUNTER — Other Ambulatory Visit: Payer: Self-pay

## 2017-08-30 DIAGNOSIS — R1310 Dysphagia, unspecified: Secondary | ICD-10-CM | POA: Diagnosis not present

## 2017-08-30 DIAGNOSIS — Z79899 Other long term (current) drug therapy: Secondary | ICD-10-CM | POA: Insufficient documentation

## 2017-08-30 DIAGNOSIS — E785 Hyperlipidemia, unspecified: Secondary | ICD-10-CM

## 2017-08-30 DIAGNOSIS — F17210 Nicotine dependence, cigarettes, uncomplicated: Secondary | ICD-10-CM | POA: Insufficient documentation

## 2017-08-30 DIAGNOSIS — I639 Cerebral infarction, unspecified: Principal | ICD-10-CM | POA: Diagnosis present

## 2017-08-30 DIAGNOSIS — R29810 Facial weakness: Secondary | ICD-10-CM | POA: Diagnosis present

## 2017-08-30 DIAGNOSIS — R471 Dysarthria and anarthria: Secondary | ICD-10-CM

## 2017-08-30 DIAGNOSIS — I10 Essential (primary) hypertension: Secondary | ICD-10-CM | POA: Diagnosis present

## 2017-08-30 HISTORY — DX: Essential (primary) hypertension: I10

## 2017-08-30 LAB — COMPREHENSIVE METABOLIC PANEL
ALBUMIN: 4.6 g/dL (ref 3.5–5.0)
ALK PHOS: 79 U/L (ref 38–126)
ALT: 17 U/L (ref 0–44)
AST: 18 U/L (ref 15–41)
Anion gap: 7 (ref 5–15)
BUN: 19 mg/dL (ref 8–23)
CALCIUM: 8.9 mg/dL (ref 8.9–10.3)
CO2: 26 mmol/L (ref 22–32)
CREATININE: 0.83 mg/dL (ref 0.61–1.24)
Chloride: 107 mmol/L (ref 98–111)
GFR calc Af Amer: >60 mL/min (ref >60)
GFR calc non Af Amer: >60 mL/min (ref >60)
GLUCOSE: 113 mg/dL — AB (ref 70–99)
Potassium: 3.7 mmol/L (ref 3.5–5.1)
SODIUM: 140 mmol/L (ref 135–145)
Total Bilirubin: 0.7 mg/dL (ref 0.3–1.2)
Total Protein: 7.5 g/dL (ref 6.5–8.1)

## 2017-08-30 LAB — I-STAT CHEM 8, ED
BUN: 19 mg/dL (ref 8–23)
CALCIUM ION: 1.15 mmol/L (ref 1.15–1.40)
Chloride: 105 mmol/L (ref 98–111)
Creatinine, Ser: 0.8 mg/dL (ref 0.61–1.24)
Glucose, Bld: 109 mg/dL — ABNORMAL HIGH (ref 70–99)
HCT: 40 % (ref 39.0–52.0)
Hemoglobin: 13.6 g/dL (ref 13.0–17.0)
Potassium: 3.8 mmol/L (ref 3.5–5.1)
SODIUM: 141 mmol/L (ref 135–145)
TCO2: 24 mmol/L (ref 22–32)

## 2017-08-30 LAB — CBC
HCT: 41.1 % (ref 39.0–52.0)
Hemoglobin: 14 g/dL (ref 13.0–17.0)
MCH: 31 pg (ref 26.0–34.0)
MCHC: 34.1 g/dL (ref 30.0–36.0)
MCV: 90.9 fL (ref 78.0–100.0)
PLATELETS: 98 10*3/uL — AB (ref 150–400)
RBC: 4.52 MIL/uL (ref 4.22–5.81)
RDW: 13.2 % (ref 11.5–15.5)
WBC: 6.5 10*3/uL (ref 4.0–10.5)

## 2017-08-30 LAB — DIFFERENTIAL
Basophils Absolute: 0 10*3/uL (ref 0.0–0.1)
Basophils Relative: 0 %
Eosinophils Absolute: 0 10*3/uL (ref 0.0–0.7)
Eosinophils Relative: 0 %
LYMPHS PCT: 27 %
Lymphs Abs: 1.8 10*3/uL (ref 0.7–4.0)
MONO ABS: 0.4 10*3/uL (ref 0.1–1.0)
Monocytes Relative: 7 %
NEUTROS ABS: 4.3 10*3/uL (ref 1.7–7.7)
NEUTROS PCT: 66 %

## 2017-08-30 LAB — ETHANOL: Alcohol, Ethyl (B): <10 mg/dL (ref <10)

## 2017-08-30 LAB — CBG MONITORING, ED: Glucose-Capillary: 118 mg/dL — ABNORMAL HIGH (ref 70–99)

## 2017-08-30 LAB — I-STAT TROPONIN, ED: Troponin i, poc: 0.01 ng/mL (ref 0.00–0.08)

## 2017-08-30 LAB — PROTIME-INR
INR: 0.96
PROTHROMBIN TIME: 12.7 s (ref 11.4–15.2)

## 2017-08-30 LAB — RAPID URINE DRUG SCREEN, HOSP PERFORMED
AMPHETAMINES: NOT DETECTED
Barbiturates: NOT DETECTED
Benzodiazepines: NOT DETECTED
Cocaine: NOT DETECTED
Opiates: NOT DETECTED
TETRAHYDROCANNABINOL: NOT DETECTED

## 2017-08-30 LAB — APTT: aPTT: 29 seconds (ref 24–36)

## 2017-08-30 MED ORDER — ACETAMINOPHEN 650 MG RE SUPP: 650.0000 mg | RECTAL | Status: DC | PRN

## 2017-08-30 MED ORDER — ASPIRIN 81 MG PO CHEW
324.0000 mg | CHEWABLE_TABLET | Freq: Once | ORAL | Status: AC
  Administered 2017-08-30: 324 mg via ORAL
  Filled 2017-08-30: qty 4

## 2017-08-30 MED ORDER — ASPIRIN 300 MG RE SUPP: 300.0000 mg | Freq: Every day | RECTAL | Status: DC

## 2017-08-30 MED ORDER — MORPHINE SULFATE (PF) 2 MG/ML IV SOLN
2.0000 mg | Freq: Once | INTRAVENOUS | Status: AC
  Administered 2017-08-30: 2 mg via INTRAVENOUS
  Filled 2017-08-30: qty 1

## 2017-08-30 MED ORDER — MORPHINE SULFATE (PF) 4 MG/ML IV SOLN
4.0000 mg | Freq: Once | INTRAVENOUS | Status: AC
  Administered 2017-08-31: 4 mg via INTRAVENOUS
  Filled 2017-08-30: qty 1

## 2017-08-30 MED ORDER — ASPIRIN 325 MG PO TABS
325.0000 mg | ORAL_TABLET | Freq: Every day | ORAL | Status: DC
  Administered 2017-08-31: 325 mg via ORAL
  Filled 2017-08-30: qty 1

## 2017-08-30 MED ORDER — IOPAMIDOL (ISOVUE-370) INJECTION 76%
100.0000 mL | Freq: Once | INTRAVENOUS | Status: AC | PRN
  Administered 2017-08-30: 100 mL via INTRAVENOUS

## 2017-08-30 MED ORDER — ACETAMINOPHEN 325 MG PO TABS: 650.0000 mg | ORAL_TABLET | ORAL | Status: DC | PRN

## 2017-08-30 MED ORDER — STROKE: EARLY STAGES OF RECOVERY BOOK
Freq: Once | Status: AC
  Administered 2017-08-30: 21:00:00
  Filled 2017-08-30 (×2): qty 1

## 2017-08-30 MED ORDER — ACETAMINOPHEN 160 MG/5ML PO SOLN: 650.0000 mg | ORAL | Status: DC | PRN

## 2017-08-30 NOTE — ED Notes (Signed)
Pt back in room from CT 

## 2017-08-30 NOTE — ED Notes (Signed)
Pt transported to CT ?

## 2017-08-30 NOTE — ED Notes (Signed)
Per Neurologist PT is not a TPA candidate.

## 2017-08-30 NOTE — ED Triage Notes (Addendum)
Wife reports she last saw patient normal at 0930 this morning. Pt says he realized he couldn't talk normally a few minutes after wife left home. When wife came home from church pt had right sided facial drooping with difficulty talking. Pt is very hard to understand. Pt denies weakness/numbness. Pt has equal grips bilaterally to upper and lower extremities. Facial droop to right side. Sensation normal. Visual fields normal.

## 2017-08-30 NOTE — ED Notes (Signed)
Code stroke paged

## 2017-08-30 NOTE — ED Notes (Addendum)
Teleneurologist accessing pt.

## 2017-08-30 NOTE — ED Notes (Signed)
Ct called and ready for pt.

## 2017-08-30 NOTE — H&P (Signed)
History and Physical    Dylan Farley ZOX:[id number] DOB: 14-Jan-1955 DOA: 08/30/2017  PCP: Celene Squibb, MD  Patient coming from: Home  Chief Complaint: Slurred speech and right facial droop  HPI: Dylan Farley is a 62 y.o. male with medical history significant of hypertension comes in with right facial droop and slurred speech since 930 this morning.  He has no previous history of stroke.  Has some dysarthria.  His right facial droop and speech is getting better throughout the day.  He takes a baby aspirin a day.  He denies any numbness tingling anywhere else.  He denies any weakness in any of his extremities.  He is ambulating normally.  Patient is being referred for admission for acute stroke.    Review of Systems: As per HPI otherwise 10 point review of systems negative.   Past Medical History:  Diagnosis Date  . Depressive disorder, not elsewhere classified   . Facet syndrome, lumbar   . Hypertension   . Lesion of sciatic nerve   . Lumbosacral spondylosis without myelopathy   . Other chronic postoperative pain   . Sciatic neuritis   . Spinal stenosis, lumbar region, without neurogenic claudication     Past Surgical History:  Procedure Laterality Date  . KNEE SURGERY       reports that he has been smoking cigarettes. He has a 45.00 pack-year smoking history. He has never used smokeless tobacco. He reports that he does not drink alcohol or use drugs.  No Active Allergies  Family History  Problem Relation Age of Onset  . Arthritis Mother   . Diabetes Brother     Prior to Admission medications   Medication Sig Start Date End Date Taking? Authorizing Provider  amitriptyline (ELAVIL) 25 MG tablet Take 1 tablet (25 mg total) by mouth at bedtime. 01/05/12   Meredith Staggers, MD  cyclobenzaprine (FLEXERIL) 10 MG tablet Take 1 tablet (10 mg total) by mouth every 8 (eight) hours as needed. 02/17/12   Prueter, Santiago Glad, PA-C  lidocaine (LIDODERM) 5 % Place 1-3 patches onto the  skin daily. Remove & Discard patch within 12 hours or as directed by MD    [provider]  omeprazole (PRILOSEC) 20 MG capsule Take 1 capsule (20 mg total) by mouth daily. 01/29/12   Meredith Staggers, MD  Oxycodone HCl 10 MG TABS Take 1 tablet (10 mg total) by mouth 4 (four) times daily as needed. 01/28/12   Prueter, Santiago Glad, PA-C  oxymorphone (OPANA ER) 40 MG 12 hr tablet Take 1 tablet (40 mg total) by mouth every 12 (twelve) hours. 01/28/12   Prueter, Santiago Glad, PA-C  pregabalin (LYRICA) 150 MG capsule Take 1 capsule (150 mg total) by mouth 3 (three) times daily. 10/21/11   Meredith Staggers, MD    Physical Exam: Vitals:   08/30/17 1630 08/30/17 1645 08/30/17 1700 08/30/17 1732  BP: (!) 154/78 (!) 154/78 109/84   Pulse: 66 68 70   Resp: 13 (!) 21 15   Temp:    97.6 F (36.4 C)  TempSrc:      SpO2: 100% 100% 98%   Weight:      Height:          Constitutional: NAD, calm, comfortable mild to moderate dysarthria Vitals:   08/30/17 1630 08/30/17 1645 08/30/17 1700 08/30/17 1732  BP: (!) 154/78 (!) 154/78 109/84   Pulse: 66 68 70   Resp: 13 (!) 21 15   Temp:    97.6  F (36.4 C)  TempSrc:      SpO2: 100% 100% 98%   Weight:      Height:       Eyes: PERRL, lids and conjunctivae normal ENMT: Mucous membranes are moist. Posterior pharynx clear of any exudate or lesions.Normal dentition.  Neck: normal, supple, no masses, no thyromegaly Respiratory: clear to auscultation bilaterally, no wheezing, no crackles. Normal respiratory effort. No accessory muscle use.  Cardiovascular: Regular rate and rhythm, no murmurs / rubs / gallops. No extremity edema. 2+ pedal pulses. No carotid bruits.  Abdomen: no tenderness, no masses palpated. No hepatosplenomegaly. Bowel sounds positive.  Musculoskeletal: no clubbing / cyanosis. No joint deformity upper and lower extremities. Good ROM, no contractures. Normal muscle tone.  Skin: no rashes, lesions, ulcers. No induration Neurologic: CN 2-12  grossly intact. Sensation intact, DTR normal. Strength 5/5 in all 4.  Except right lower facial droop Psychiatric: Normal judgment and insight. Alert and oriented x 3. Normal mood.    Labs on Admission: I have personally reviewed following labs and imaging studies  CBC: Recent Labs  Lab 08/30/17 1522 08/30/17 1534  WBC 6.5  --   NEUTROABS 4.3  --   HGB 14.0 13.6  HCT 41.1 40.0  MCV 90.9  --   PLT 98*  --    Basic Metabolic Panel: Recent Labs  Lab 08/30/17 1522 08/30/17 1534  NA 140 141  K 3.7 3.8  CL 107 105  CO2 26  --   GLUCOSE 113* 109*  BUN 19 19  CREATININE 0.83 0.80  CALCIUM 8.9  --    GFR: Estimated Creatinine Clearance: 97.5 mL/min (by C-G formula based on SCr of 0.8 mg/dL). Liver Function Tests: Recent Labs  Lab 08/30/17 1522  AST 18  ALT 17  ALKPHOS 79  BILITOT 0.7  PROT 7.5  ALBUMIN 4.6   No results for input(s): LIPASE, AMYLASE in the last 168 hours. No results for input(s): AMMONIA in the last 168 hours. Coagulation Profile: Recent Labs  Lab 08/30/17 1522  INR 0.96   Cardiac Enzymes: No results for input(s): CKTOTAL, CKMB, CKMBINDEX, TROPONINI in the last 168 hours. BNP (last 3 results) No results for input(s): PROBNP in the last 8760 hours. HbA1C: No results for input(s): HGBA1C in the last 72 hours. CBG: Recent Labs  Lab 08/30/17 1521  GLUCAP 118*   Lipid Profile: No results for input(s): CHOL, HDL, LDLCALC, TRIG, CHOLHDL, LDLDIRECT in the last 72 hours. Thyroid Function Tests: No results for input(s): TSH, T4TOTAL, FREET4, T3FREE, THYROIDAB in the last 72 hours. Anemia Panel: No results for input(s): VITAMINB12, FOLATE, FERRITIN, TIBC, IRON, RETICCTPCT in the last 72 hours. Urine analysis: No results found for: COLORURINE, APPEARANCEUR, LABSPEC, PHURINE, GLUCOSEU, HGBUR, BILIRUBINUR, KETONESUR, PROTEINUR, UROBILINOGEN, NITRITE, LEUKOCYTESUR Sepsis Labs:  !!!!!!!!!!!!!!!!!!!!!!!!!!!!!!!!!!!!!!!!!!!! @LABRCNTIP (procalcitonin:4,lacticidven:4) )No results found for this or any previous visit (from the past 240 hour(s)).   Radiological Exams on Admission: Ct Angio Head W Or Wo Contrast  Result Date: 08/30/2017 CLINICAL DATA:  62 year old male code stroke with abnormal speech and right facial droop. Evidence of small left inferior frontal gyrus cortical infarct on noncontrast head CT 1530 hours. EXAM: CT ANGIOGRAPHY HEAD AND NECK CT PERFUSION BRAIN TECHNIQUE: Multidetector CT imaging of the head and neck was performed using the standard protocol during bolus administration of intravenous contrast. Multiplanar CT image reconstructions and MIPs were obtained to evaluate the vascular anatomy. Carotid stenosis measurements (when applicable) are obtained utilizing NASCET criteria, using the distal internal carotid diameter as the  denominator. Multiphase CT imaging of the brain was performed following IV bolus contrast injection. Subsequent parametric perfusion maps were calculated using RAPID software. CONTRAST:  143mL ISOVUE-370 IOPAMIDOL (ISOVUE-370) INJECTION 76% COMPARISON:  Head CT 1530 hours today. FINDINGS: CT Brain Perfusion Findings: ASPECTS of 9 on the comparison. CBF (<30%) Volume: zero Perfusion (Tmax>6.0s) volume: zero according to the Rapid algorithm; however, on the Tmax color maps (series 1201, image 5) there is visually a small area of prolonged T-max corresponding to the same area of cortical hypodensity seen by plain CT. Mismatch Volume: Not applicable CTA NECK Skeleton: Absent dentition. No acute osseous abnormality identified. Lower cervical spine degeneration. Upper chest: No superior mediastinal lymphadenopathy. Paraseptal emphysema with apical lung scarring greater on the right. Occasional calcified granuloma. Mild dependent atelectasis. Other neck: Negative. Aortic arch: 3 vessel arch configuration. Mild soft and calcified arch atherosclerosis.  Right carotid system: Minimal brachiocephalic and right CCA origin soft plaque without stenosis. Soft plaque at the right carotid bifurcation continuing into the posterior right ICA bulb with prominent low-density soft plaque in the distal bulb but no stenosis (series 8, image 66). Left carotid system: Negative left CCA origin. Mild soft plaque at the left carotid bifurcation but somewhat bulky soft plaque at the distal left ICA bulb similar to the right side (series 8, image 136), but no associated stenosis. Vertebral arteries: No proximal right subclavian artery stenosis despite mild soft plaque. Normal right vertebral artery origin. Normal right vertebral artery to the skull base. No proximal left subclavian artery stenosis despite soft and calcified plaque. Soft and calcified plaque near the left vertebral artery origin results in only mild stenosis (series 7, image 138). Otherwise normal left vertebral artery to the skull base. CTA HEAD Posterior circulation: Relatively codominant distal vertebral arteries without stenosis. Normal PICA origins. Patent vertebrobasilar junction. Patent basilar artery without stenosis. Normal SCA and PCA origins. Posterior communicating arteries are diminutive or absent. Normal bilateral PCA branches. Anterior circulation: Both ICA siphons are patent. Mild bilateral siphon atherosclerosis with no stenosis. Normal ophthalmic artery origins. Patent carotid termini. The left MCA M1 segment and MCA bifurcation are patent. The left MCA M2 branches are patent and normal. No left MCA branch occlusion is identified. Anterior communicating artery and bilateral ACA branches are normal. Right MCA M1 segment, right MCA trifurcation, and right MCA branches are within normal limits. Venous sinuses: Patent. Anatomic variants: None. Review of the MIP images confirms the above findings IMPRESSION: 1. Negative for large vessel occlusion, and CTP does not detect core infarct, although scrutiny of  the Tmax map does demonstrate a small area of delayed perfusion corresponding to the plain CT finding today (series 1201, image 5). I therefore suspect a small left MCA infarct such as from very distal branch occlusion. I identify no left MCA branch occlusion by CTA. 2. Of note, the patient does have low-density soft plaque in both ICA bulbs. There is no significant carotid stenosis. 3. The above discussed by telephone with Dr. Davonna Belling on 08/30/2017 at 16:10 . 4. Apical lung scarring and distortion. Electronically Signed   By: Genevie Ann M.D.   On: 08/30/2017 16:15   Ct Angio Neck W Or Wo Contrast  Result Date: 08/30/2017 CLINICAL DATA:  62 year old male code stroke with abnormal speech and right facial droop. Evidence of small left inferior frontal gyrus cortical infarct on noncontrast head CT 1530 hours. EXAM: CT ANGIOGRAPHY HEAD AND NECK CT PERFUSION BRAIN TECHNIQUE: Multidetector CT imaging of the head and neck was performed  using the standard protocol during bolus administration of intravenous contrast. Multiplanar CT image reconstructions and MIPs were obtained to evaluate the vascular anatomy. Carotid stenosis measurements (when applicable) are obtained utilizing NASCET criteria, using the distal internal carotid diameter as the denominator. Multiphase CT imaging of the brain was performed following IV bolus contrast injection. Subsequent parametric perfusion maps were calculated using RAPID software. CONTRAST:  130mL ISOVUE-370 IOPAMIDOL (ISOVUE-370) INJECTION 76% COMPARISON:  Head CT 1530 hours today. FINDINGS: CT Brain Perfusion Findings: ASPECTS of 9 on the comparison. CBF (<30%) Volume: zero Perfusion (Tmax>6.0s) volume: zero according to the Rapid algorithm; however, on the Tmax color maps (series 1201, image 5) there is visually a small area of prolonged T-max corresponding to the same area of cortical hypodensity seen by plain CT. Mismatch Volume: Not applicable CTA NECK Skeleton: Absent  dentition. No acute osseous abnormality identified. Lower cervical spine degeneration. Upper chest: No superior mediastinal lymphadenopathy. Paraseptal emphysema with apical lung scarring greater on the right. Occasional calcified granuloma. Mild dependent atelectasis. Other neck: Negative. Aortic arch: 3 vessel arch configuration. Mild soft and calcified arch atherosclerosis. Right carotid system: Minimal brachiocephalic and right CCA origin soft plaque without stenosis. Soft plaque at the right carotid bifurcation continuing into the posterior right ICA bulb with prominent low-density soft plaque in the distal bulb but no stenosis (series 8, image 66). Left carotid system: Negative left CCA origin. Mild soft plaque at the left carotid bifurcation but somewhat bulky soft plaque at the distal left ICA bulb similar to the right side (series 8, image 136), but no associated stenosis. Vertebral arteries: No proximal right subclavian artery stenosis despite mild soft plaque. Normal right vertebral artery origin. Normal right vertebral artery to the skull base. No proximal left subclavian artery stenosis despite soft and calcified plaque. Soft and calcified plaque near the left vertebral artery origin results in only mild stenosis (series 7, image 138). Otherwise normal left vertebral artery to the skull base. CTA HEAD Posterior circulation: Relatively codominant distal vertebral arteries without stenosis. Normal PICA origins. Patent vertebrobasilar junction. Patent basilar artery without stenosis. Normal SCA and PCA origins. Posterior communicating arteries are diminutive or absent. Normal bilateral PCA branches. Anterior circulation: Both ICA siphons are patent. Mild bilateral siphon atherosclerosis with no stenosis. Normal ophthalmic artery origins. Patent carotid termini. The left MCA M1 segment and MCA bifurcation are patent. The left MCA M2 branches are patent and normal. No left MCA branch occlusion is identified.  Anterior communicating artery and bilateral ACA branches are normal. Right MCA M1 segment, right MCA trifurcation, and right MCA branches are within normal limits. Venous sinuses: Patent. Anatomic variants: None. Review of the MIP images confirms the above findings IMPRESSION: 1. Negative for large vessel occlusion, and CTP does not detect core infarct, although scrutiny of the Tmax map does demonstrate a small area of delayed perfusion corresponding to the plain CT finding today (series 1201, image 5). I therefore suspect a small left MCA infarct such as from very distal branch occlusion. I identify no left MCA branch occlusion by CTA. 2. Of note, the patient does have low-density soft plaque in both ICA bulbs. There is no significant carotid stenosis. 3. The above discussed by telephone with Dr. Davonna Belling on 08/30/2017 at 16:10 . 4. Apical lung scarring and distortion. Electronically Signed   By: Genevie Ann M.D.   On: 08/30/2017 16:15   Ct Cerebral Perfusion W Contrast  Result Date: 08/30/2017 CLINICAL DATA:  62 year old male code stroke with abnormal speech  and right facial droop. Evidence of small left inferior frontal gyrus cortical infarct on noncontrast head CT 1530 hours. EXAM: CT ANGIOGRAPHY HEAD AND NECK CT PERFUSION BRAIN TECHNIQUE: Multidetector CT imaging of the head and neck was performed using the standard protocol during bolus administration of intravenous contrast. Multiplanar CT image reconstructions and MIPs were obtained to evaluate the vascular anatomy. Carotid stenosis measurements (when applicable) are obtained utilizing NASCET criteria, using the distal internal carotid diameter as the denominator. Multiphase CT imaging of the brain was performed following IV bolus contrast injection. Subsequent parametric perfusion maps were calculated using RAPID software. CONTRAST:  136mL ISOVUE-370 IOPAMIDOL (ISOVUE-370) INJECTION 76% COMPARISON:  Head CT 1530 hours today. FINDINGS: CT Brain  Perfusion Findings: ASPECTS of 9 on the comparison. CBF (<30%) Volume: zero Perfusion (Tmax>6.0s) volume: zero according to the Rapid algorithm; however, on the Tmax color maps (series 1201, image 5) there is visually a small area of prolonged T-max corresponding to the same area of cortical hypodensity seen by plain CT. Mismatch Volume: Not applicable CTA NECK Skeleton: Absent dentition. No acute osseous abnormality identified. Lower cervical spine degeneration. Upper chest: No superior mediastinal lymphadenopathy. Paraseptal emphysema with apical lung scarring greater on the right. Occasional calcified granuloma. Mild dependent atelectasis. Other neck: Negative. Aortic arch: 3 vessel arch configuration. Mild soft and calcified arch atherosclerosis. Right carotid system: Minimal brachiocephalic and right CCA origin soft plaque without stenosis. Soft plaque at the right carotid bifurcation continuing into the posterior right ICA bulb with prominent low-density soft plaque in the distal bulb but no stenosis (series 8, image 66). Left carotid system: Negative left CCA origin. Mild soft plaque at the left carotid bifurcation but somewhat bulky soft plaque at the distal left ICA bulb similar to the right side (series 8, image 136), but no associated stenosis. Vertebral arteries: No proximal right subclavian artery stenosis despite mild soft plaque. Normal right vertebral artery origin. Normal right vertebral artery to the skull base. No proximal left subclavian artery stenosis despite soft and calcified plaque. Soft and calcified plaque near the left vertebral artery origin results in only mild stenosis (series 7, image 138). Otherwise normal left vertebral artery to the skull base. CTA HEAD Posterior circulation: Relatively codominant distal vertebral arteries without stenosis. Normal PICA origins. Patent vertebrobasilar junction. Patent basilar artery without stenosis. Normal SCA and PCA origins. Posterior  communicating arteries are diminutive or absent. Normal bilateral PCA branches. Anterior circulation: Both ICA siphons are patent. Mild bilateral siphon atherosclerosis with no stenosis. Normal ophthalmic artery origins. Patent carotid termini. The left MCA M1 segment and MCA bifurcation are patent. The left MCA M2 branches are patent and normal. No left MCA branch occlusion is identified. Anterior communicating artery and bilateral ACA branches are normal. Right MCA M1 segment, right MCA trifurcation, and right MCA branches are within normal limits. Venous sinuses: Patent. Anatomic variants: None. Review of the MIP images confirms the above findings IMPRESSION: 1. Negative for large vessel occlusion, and CTP does not detect core infarct, although scrutiny of the Tmax map does demonstrate a small area of delayed perfusion corresponding to the plain CT finding today (series 1201, image 5). I therefore suspect a small left MCA infarct such as from very distal branch occlusion. I identify no left MCA branch occlusion by CTA. 2. Of note, the patient does have low-density soft plaque in both ICA bulbs. There is no significant carotid stenosis. 3. The above discussed by telephone with Dr. Davonna Belling on 08/30/2017 at 16:10 . 4. Apical  lung scarring and distortion. Electronically Signed   By: Genevie Ann M.D.   On: 08/30/2017 16:15   Ct Head Code Stroke Wo Contrast`  Result Date: 08/30/2017 CLINICAL DATA:  Code stroke. 62 year old male code stroke. Right side facial droop and aphasia. EXAM: CT HEAD WITHOUT CONTRAST TECHNIQUE: Contiguous axial images were obtained from the base of the skull through the vertex without intravenous contrast. COMPARISON:  Cervical spine MRI 04/09/2007. FINDINGS: Brain: There is a subtle area of cortical white matter hypodensity at the left inferior frontal gyrus, likely near the region of Broca's area, best seen on sagittal image 49. No associated hemorrhage or mass effect. No other loss of  gray-white matter differentiation identified. Cavum septum pellucidum, normal variant. Scattered bilateral subcortical white matter hypodensity in the frontal lobes. No midline shift, ventriculomegaly, mass effect, evidence of mass lesion. Vascular: No suspicious intracranial vascular hyperdensity. Skull: Negative. Sinuses/Orbits: Clear. Other: Visualized orbit soft tissues are within normal limits. Visualized scalp soft tissues are within normal limits. ASPECTS [facility] Stroke Program Early CT Score) - Ganglionic level infarction (caudate, lentiform nuclei, internal capsule, insula, M1-M3 cortex): 7 - Supraganglionic infarction (M4-M6 cortex): 2 (abnormal M4 segment). Total score (0-10 with 10 being normal): 10 IMPRESSION: 1. Acute left MCA territory infarct with small area of left inferior frontal gyrus cortical white matter hypodensity. No associated hemorrhage or mass effect. ASPECTS is 9. 2. Study discussed by telephone with Dr. Davonna Belling on 08/30/2017 at 15:46. CTA/CTP is pending. Electronically Signed   By: Genevie Ann M.D.   On: 08/30/2017 15:48    EKG: Independently reviewed.  Normal sinus rhythm with PVCs Old chart reviewed Case discussed with Dr. Alvino Chapel  Assessment/Plan 62 year old male with acute CVA Principal Problem:   CVA (cerebral vascular accident) (HCC)-observe overnight on telemetry.  Obtain MRI brain.  Placed on full dose aspirin.  He takes baby aspirin a day.  Obtain carotid Dopplers.  Obtain cardiac echo.  Placed on CVA pathway.  Obtain frequent neurological checks.  Obtain speech therapy physical therapy and Occupational Therapy evaluations  Active Problems:   Hypertension-allow permissive hypertension at this time.      DVT prophylaxis: SCDs Code Status: Full Family Communication: Wife Disposition Plan: Tomorrow Consults called: None Admission status: Observation   Shandrell Boda A MD [facility]  If 7PM-7AM, please contact  night-coverage [email or web address] Password [facility]  08/30/2017, 5:33 PM

## 2017-08-30 NOTE — ED Notes (Signed)
Teleneurologist sent to Dr. Alvino Chapel at [phone].

## 2017-08-30 NOTE — ED Notes (Signed)
PT transported to CT scan at this time by nursing staff.

## 2017-08-30 NOTE — ED Notes (Signed)
Teleneurology at bedside

## 2017-08-30 NOTE — ED Provider Notes (Signed)
Shanda Howells [facility] EMERGENCY DEPARTMENT Provider Note   CSN: [id number] Arrival date & time: 08/30/17  1508     History   Chief Complaint Chief Complaint  Patient presents with  . Facial Droop    HPI PAULO KEIMIG is a 62 y.o. male.  HPI Patient presents with difficulty speaking and facial droop.  Last normal was at 930 this morning and patient arrives at around 330.  Wife had left to go to church and when she came back he had the deficits.  No headache.  No confusion.  No other numbness or weakness and is able to ambulate.  He is on baby aspirin.  No trauma. Past Medical History:  Diagnosis Date  . Depressive disorder, not elsewhere classified   . Facet syndrome, lumbar   . Hypertension   . Lesion of sciatic nerve   . Lumbosacral spondylosis without myelopathy   . Other chronic postoperative pain   . Sciatic neuritis   . Spinal stenosis, lumbar region, without neurogenic claudication     Patient Active Problem List   Diagnosis Date Noted  . Lumbar spondylosis 05/23/2011  . Lumbar facet arthropathy 05/23/2011    Past Surgical History:  Procedure Laterality Date  . KNEE SURGERY          Home Medications    Prior to Admission medications   Medication Sig Start Date End Date Taking? Authorizing Provider  amitriptyline (ELAVIL) 25 MG tablet Take 1 tablet (25 mg total) by mouth at bedtime. 01/05/12   Meredith Staggers, MD  cyclobenzaprine (FLEXERIL) 10 MG tablet Take 1 tablet (10 mg total) by mouth every 8 (eight) hours as needed. 02/17/12   Prueter, Santiago Glad, PA-C  lidocaine (LIDODERM) 5 % Place 1-3 patches onto the skin daily. Remove & Discard patch within 12 hours or as directed by MD    [provider]  omeprazole (PRILOSEC) 20 MG capsule Take 1 capsule (20 mg total) by mouth daily. 01/29/12   Meredith Staggers, MD  Oxycodone HCl 10 MG TABS Take 1 tablet (10 mg total) by mouth 4 (four) times daily as needed. 01/28/12   Prueter, Santiago Glad, PA-C  oxymorphone (OPANA ER)  40 MG 12 hr tablet Take 1 tablet (40 mg total) by mouth every 12 (twelve) hours. 01/28/12   Prueter, Santiago Glad, PA-C  pregabalin (LYRICA) 150 MG capsule Take 1 capsule (150 mg total) by mouth 3 (three) times daily. 10/21/11   Meredith Staggers, MD    Family History Family History  Problem Relation Age of Onset  . Arthritis Mother   . Diabetes Brother     Social History Social History   Tobacco Use  . Smoking status: Current Every Day Smoker    Packs/day: 1.00    Years: 45.00    Pack years: 45.00    Types: Cigarettes  . Smokeless tobacco: Never Used  Substance Use Topics  . Alcohol use: No  . Drug use: No     Allergies   Patient has no active allergies.   Review of Systems Review of Systems  Constitutional: Negative for appetite change.  Respiratory: Negative for shortness of breath.   Cardiovascular: Negative for chest pain.  Gastrointestinal: Negative for abdominal pain.  Genitourinary: Negative for flank pain.  Musculoskeletal: Negative for back pain.  Skin: Negative for rash.  Neurological: Positive for facial asymmetry and speech difficulty.  Hematological: Negative for adenopathy.  Psychiatric/Behavioral: Negative for confusion.     Physical Exam Updated Vital Signs BP (!) 153/82  Pulse 71   Temp 97.6 F (36.4 C) (Oral)   Resp 14   Ht 5\' 11"  (1.803 m)   Wt 73.9 kg   SpO2 100%   BMI 22.73 kg/m   Physical Exam  Constitutional: He is oriented to person, place, and time. He appears well-developed.  HENT:  Head: Atraumatic.  Right lower facial droop..  Eyes: EOM are normal.  Neck: Neck supple.  Cardiovascular: Normal rate.  Pulmonary/Chest: Effort normal.  Abdominal: Soft.  Musculoskeletal: He exhibits no tenderness.  Neurological: He is alert and oriented to person, place, and time.  Right lower facial droop.  Forehead spared.  Eye movements intact.  Visual fields grossly intact to confrontation.  Finger-nose intact bilaterally.  Good grip strength  bilaterally.  No drift.  Heel shin intact bilaterally.  Good straight leg raise bilaterally.  Does have dysarthric speech however.  Complete NIH scoring done by neurology.  Skin: Skin is warm. Capillary refill takes less than 2 seconds.     ED Treatments / Results  Labs (all labs ordered are listed, but only abnormal results are displayed) Labs Reviewed  CBC - Abnormal; Notable for the following components:      Result Value   Platelets 98 (*)    All other components within normal limits  COMPREHENSIVE METABOLIC PANEL - Abnormal; Notable for the following components:   Glucose, Bld 113 (*)    All other components within normal limits  CBG MONITORING, ED - Abnormal; Notable for the following components:   Glucose-Capillary 118 (*)    All other components within normal limits  I-STAT CHEM 8, ED - Abnormal; Notable for the following components:   Glucose, Bld 109 (*)    All other components within normal limits  ETHANOL  PROTIME-INR  APTT  DIFFERENTIAL  RAPID URINE DRUG SCREEN, HOSP PERFORMED  URINALYSIS, ROUTINE W REFLEX MICROSCOPIC  I-STAT TROPONIN, ED    EKG None  Radiology Ct Angio Head W Or Wo Contrast  Result Date: 08/30/2017 CLINICAL DATA:  62 year old male code stroke with abnormal speech and right facial droop. Evidence of small left inferior frontal gyrus cortical infarct on noncontrast head CT 1530 hours. EXAM: CT ANGIOGRAPHY HEAD AND NECK CT PERFUSION BRAIN TECHNIQUE: Multidetector CT imaging of the head and neck was performed using the standard protocol during bolus administration of intravenous contrast. Multiplanar CT image reconstructions and MIPs were obtained to evaluate the vascular anatomy. Carotid stenosis measurements (when applicable) are obtained utilizing NASCET criteria, using the distal internal carotid diameter as the denominator. Multiphase CT imaging of the brain was performed following IV bolus contrast injection. Subsequent parametric perfusion maps  were calculated using RAPID software. CONTRAST:  158mL ISOVUE-370 IOPAMIDOL (ISOVUE-370) INJECTION 76% COMPARISON:  Head CT 1530 hours today. FINDINGS: CT Brain Perfusion Findings: ASPECTS of 9 on the comparison. CBF (<30%) Volume: zero Perfusion (Tmax>6.0s) volume: zero according to the Rapid algorithm; however, on the Tmax color maps (series 1201, image 5) there is visually a small area of prolonged T-max corresponding to the same area of cortical hypodensity seen by plain CT. Mismatch Volume: Not applicable CTA NECK Skeleton: Absent dentition. No acute osseous abnormality identified. Lower cervical spine degeneration. Upper chest: No superior mediastinal lymphadenopathy. Paraseptal emphysema with apical lung scarring greater on the right. Occasional calcified granuloma. Mild dependent atelectasis. Other neck: Negative. Aortic arch: 3 vessel arch configuration. Mild soft and calcified arch atherosclerosis. Right carotid system: Minimal brachiocephalic and right CCA origin soft plaque without stenosis. Soft plaque at the right carotid  bifurcation continuing into the posterior right ICA bulb with prominent low-density soft plaque in the distal bulb but no stenosis (series 8, image 66). Left carotid system: Negative left CCA origin. Mild soft plaque at the left carotid bifurcation but somewhat bulky soft plaque at the distal left ICA bulb similar to the right side (series 8, image 136), but no associated stenosis. Vertebral arteries: No proximal right subclavian artery stenosis despite mild soft plaque. Normal right vertebral artery origin. Normal right vertebral artery to the skull base. No proximal left subclavian artery stenosis despite soft and calcified plaque. Soft and calcified plaque near the left vertebral artery origin results in only mild stenosis (series 7, image 138). Otherwise normal left vertebral artery to the skull base. CTA HEAD Posterior circulation: Relatively codominant distal vertebral arteries  without stenosis. Normal PICA origins. Patent vertebrobasilar junction. Patent basilar artery without stenosis. Normal SCA and PCA origins. Posterior communicating arteries are diminutive or absent. Normal bilateral PCA branches. Anterior circulation: Both ICA siphons are patent. Mild bilateral siphon atherosclerosis with no stenosis. Normal ophthalmic artery origins. Patent carotid termini. The left MCA M1 segment and MCA bifurcation are patent. The left MCA M2 branches are patent and normal. No left MCA branch occlusion is identified. Anterior communicating artery and bilateral ACA branches are normal. Right MCA M1 segment, right MCA trifurcation, and right MCA branches are within normal limits. Venous sinuses: Patent. Anatomic variants: None. Review of the MIP images confirms the above findings IMPRESSION: 1. Negative for large vessel occlusion, and CTP does not detect core infarct, although scrutiny of the Tmax map does demonstrate a small area of delayed perfusion corresponding to the plain CT finding today (series 1201, image 5). I therefore suspect a small left MCA infarct such as from very distal branch occlusion. I identify no left MCA branch occlusion by CTA. 2. Of note, the patient does have low-density soft plaque in both ICA bulbs. There is no significant carotid stenosis. 3. The above discussed by telephone with Dr. Davonna Belling on 08/30/2017 at 16:10 . 4. Apical lung scarring and distortion. Electronically Signed   By: Genevie Ann M.D.   On: 08/30/2017 16:15   Ct Angio Neck W Or Wo Contrast  Result Date: 08/30/2017 CLINICAL DATA:  62 year old male code stroke with abnormal speech and right facial droop. Evidence of small left inferior frontal gyrus cortical infarct on noncontrast head CT 1530 hours. EXAM: CT ANGIOGRAPHY HEAD AND NECK CT PERFUSION BRAIN TECHNIQUE: Multidetector CT imaging of the head and neck was performed using the standard protocol during bolus administration of intravenous  contrast. Multiplanar CT image reconstructions and MIPs were obtained to evaluate the vascular anatomy. Carotid stenosis measurements (when applicable) are obtained utilizing NASCET criteria, using the distal internal carotid diameter as the denominator. Multiphase CT imaging of the brain was performed following IV bolus contrast injection. Subsequent parametric perfusion maps were calculated using RAPID software. CONTRAST:  171mL ISOVUE-370 IOPAMIDOL (ISOVUE-370) INJECTION 76% COMPARISON:  Head CT 1530 hours today. FINDINGS: CT Brain Perfusion Findings: ASPECTS of 9 on the comparison. CBF (<30%) Volume: zero Perfusion (Tmax>6.0s) volume: zero according to the Rapid algorithm; however, on the Tmax color maps (series 1201, image 5) there is visually a small area of prolonged T-max corresponding to the same area of cortical hypodensity seen by plain CT. Mismatch Volume: Not applicable CTA NECK Skeleton: Absent dentition. No acute osseous abnormality identified. Lower cervical spine degeneration. Upper chest: No superior mediastinal lymphadenopathy. Paraseptal emphysema with apical lung scarring greater on the  right. Occasional calcified granuloma. Mild dependent atelectasis. Other neck: Negative. Aortic arch: 3 vessel arch configuration. Mild soft and calcified arch atherosclerosis. Right carotid system: Minimal brachiocephalic and right CCA origin soft plaque without stenosis. Soft plaque at the right carotid bifurcation continuing into the posterior right ICA bulb with prominent low-density soft plaque in the distal bulb but no stenosis (series 8, image 66). Left carotid system: Negative left CCA origin. Mild soft plaque at the left carotid bifurcation but somewhat bulky soft plaque at the distal left ICA bulb similar to the right side (series 8, image 136), but no associated stenosis. Vertebral arteries: No proximal right subclavian artery stenosis despite mild soft plaque. Normal right vertebral artery origin.  Normal right vertebral artery to the skull base. No proximal left subclavian artery stenosis despite soft and calcified plaque. Soft and calcified plaque near the left vertebral artery origin results in only mild stenosis (series 7, image 138). Otherwise normal left vertebral artery to the skull base. CTA HEAD Posterior circulation: Relatively codominant distal vertebral arteries without stenosis. Normal PICA origins. Patent vertebrobasilar junction. Patent basilar artery without stenosis. Normal SCA and PCA origins. Posterior communicating arteries are diminutive or absent. Normal bilateral PCA branches. Anterior circulation: Both ICA siphons are patent. Mild bilateral siphon atherosclerosis with no stenosis. Normal ophthalmic artery origins. Patent carotid termini. The left MCA M1 segment and MCA bifurcation are patent. The left MCA M2 branches are patent and normal. No left MCA branch occlusion is identified. Anterior communicating artery and bilateral ACA branches are normal. Right MCA M1 segment, right MCA trifurcation, and right MCA branches are within normal limits. Venous sinuses: Patent. Anatomic variants: None. Review of the MIP images confirms the above findings IMPRESSION: 1. Negative for large vessel occlusion, and CTP does not detect core infarct, although scrutiny of the Tmax map does demonstrate a small area of delayed perfusion corresponding to the plain CT finding today (series 1201, image 5). I therefore suspect a small left MCA infarct such as from very distal branch occlusion. I identify no left MCA branch occlusion by CTA. 2. Of note, the patient does have low-density soft plaque in both ICA bulbs. There is no significant carotid stenosis. 3. The above discussed by telephone with Dr. Davonna Belling on 08/30/2017 at 16:10 . 4. Apical lung scarring and distortion. Electronically Signed   By: Genevie Ann M.D.   On: 08/30/2017 16:15   Ct Cerebral Perfusion W Contrast  Result Date:  08/30/2017 CLINICAL DATA:  62 year old male code stroke with abnormal speech and right facial droop. Evidence of small left inferior frontal gyrus cortical infarct on noncontrast head CT 1530 hours. EXAM: CT ANGIOGRAPHY HEAD AND NECK CT PERFUSION BRAIN TECHNIQUE: Multidetector CT imaging of the head and neck was performed using the standard protocol during bolus administration of intravenous contrast. Multiplanar CT image reconstructions and MIPs were obtained to evaluate the vascular anatomy. Carotid stenosis measurements (when applicable) are obtained utilizing NASCET criteria, using the distal internal carotid diameter as the denominator. Multiphase CT imaging of the brain was performed following IV bolus contrast injection. Subsequent parametric perfusion maps were calculated using RAPID software. CONTRAST:  18mL ISOVUE-370 IOPAMIDOL (ISOVUE-370) INJECTION 76% COMPARISON:  Head CT 1530 hours today. FINDINGS: CT Brain Perfusion Findings: ASPECTS of 9 on the comparison. CBF (<30%) Volume: zero Perfusion (Tmax>6.0s) volume: zero according to the Rapid algorithm; however, on the Tmax color maps (series 1201, image 5) there is visually a small area of prolonged T-max corresponding to the same area of  cortical hypodensity seen by plain CT. Mismatch Volume: Not applicable CTA NECK Skeleton: Absent dentition. No acute osseous abnormality identified. Lower cervical spine degeneration. Upper chest: No superior mediastinal lymphadenopathy. Paraseptal emphysema with apical lung scarring greater on the right. Occasional calcified granuloma. Mild dependent atelectasis. Other neck: Negative. Aortic arch: 3 vessel arch configuration. Mild soft and calcified arch atherosclerosis. Right carotid system: Minimal brachiocephalic and right CCA origin soft plaque without stenosis. Soft plaque at the right carotid bifurcation continuing into the posterior right ICA bulb with prominent low-density soft plaque in the distal bulb but no  stenosis (series 8, image 66). Left carotid system: Negative left CCA origin. Mild soft plaque at the left carotid bifurcation but somewhat bulky soft plaque at the distal left ICA bulb similar to the right side (series 8, image 136), but no associated stenosis. Vertebral arteries: No proximal right subclavian artery stenosis despite mild soft plaque. Normal right vertebral artery origin. Normal right vertebral artery to the skull base. No proximal left subclavian artery stenosis despite soft and calcified plaque. Soft and calcified plaque near the left vertebral artery origin results in only mild stenosis (series 7, image 138). Otherwise normal left vertebral artery to the skull base. CTA HEAD Posterior circulation: Relatively codominant distal vertebral arteries without stenosis. Normal PICA origins. Patent vertebrobasilar junction. Patent basilar artery without stenosis. Normal SCA and PCA origins. Posterior communicating arteries are diminutive or absent. Normal bilateral PCA branches. Anterior circulation: Both ICA siphons are patent. Mild bilateral siphon atherosclerosis with no stenosis. Normal ophthalmic artery origins. Patent carotid termini. The left MCA M1 segment and MCA bifurcation are patent. The left MCA M2 branches are patent and normal. No left MCA branch occlusion is identified. Anterior communicating artery and bilateral ACA branches are normal. Right MCA M1 segment, right MCA trifurcation, and right MCA branches are within normal limits. Venous sinuses: Patent. Anatomic variants: None. Review of the MIP images confirms the above findings IMPRESSION: 1. Negative for large vessel occlusion, and CTP does not detect core infarct, although scrutiny of the Tmax map does demonstrate a small area of delayed perfusion corresponding to the plain CT finding today (series 1201, image 5). I therefore suspect a small left MCA infarct such as from very distal branch occlusion. I identify no left MCA branch  occlusion by CTA. 2. Of note, the patient does have low-density soft plaque in both ICA bulbs. There is no significant carotid stenosis. 3. The above discussed by telephone with Dr. Davonna Belling on 08/30/2017 at 16:10 . 4. Apical lung scarring and distortion. Electronically Signed   By: Genevie Ann M.D.   On: 08/30/2017 16:15   Ct Head Code Stroke Wo Contrast`  Result Date: 08/30/2017 CLINICAL DATA:  Code stroke. 62 year old male code stroke. Right side facial droop and aphasia. EXAM: CT HEAD WITHOUT CONTRAST TECHNIQUE: Contiguous axial images were obtained from the base of the skull through the vertex without intravenous contrast. COMPARISON:  Cervical spine MRI 04/09/2007. FINDINGS: Brain: There is a subtle area of cortical white matter hypodensity at the left inferior frontal gyrus, likely near the region of Broca's area, best seen on sagittal image 49. No associated hemorrhage or mass effect. No other loss of gray-white matter differentiation identified. Cavum septum pellucidum, normal variant. Scattered bilateral subcortical white matter hypodensity in the frontal lobes. No midline shift, ventriculomegaly, mass effect, evidence of mass lesion. Vascular: No suspicious intracranial vascular hyperdensity. Skull: Negative. Sinuses/Orbits: Clear. Other: Visualized orbit soft tissues are within normal limits. Visualized scalp soft tissues  are within normal limits. ASPECTS [facility] Stroke Program Early CT Score) - Ganglionic level infarction (caudate, lentiform nuclei, internal capsule, insula, M1-M3 cortex): 7 - Supraganglionic infarction (M4-M6 cortex): 2 (abnormal M4 segment). Total score (0-10 with 10 being normal): 10 IMPRESSION: 1. Acute left MCA territory infarct with small area of left inferior frontal gyrus cortical white matter hypodensity. No associated hemorrhage or mass effect. ASPECTS is 9. 2. Study discussed by telephone with Dr. Davonna Belling on 08/30/2017 at 15:46. CTA/CTP is pending.  Electronically Signed   By: Genevie Ann M.D.   On: 08/30/2017 15:48    Procedures Procedures (including critical care time)  Medications Ordered in ED Medications  iopamidol (ISOVUE-370) 76 % injection 100 mL (100 mLs Intravenous Contrast Given 08/30/17 1540)     Initial Impression / Assessment and Plan / ED Course  I have reviewed the triage vital signs and the nursing notes.  Pertinent labs & imaging results that were available during my care of the patient were reviewed by me and considered in my medical decision making (see chart for details).     Patient presented with focal neuro deficits.  Last normal around 6 hours prior to arrival.  After seeing patient I called a code stroke since the dysarthria could be potentially debilitating.  Discussed with Dr. Malen Gauze from [facility] neurology and later tele-neurology.  Initial head CT shows likely acute stroke.  CT angiography and perfusion do not show large vessel occlusion.  No core infarct on perfusion but likely does have small lesion.  Not a TPA candidate due to time of onset along with the NIH score.  Will admit to hospitalist for further evaluation and treatment.  CRITICAL CARE Performed by: Davonna Belling Total critical care time: 30 minutes Critical care time was exclusive of separately billable procedures and treating other patients. Critical care was necessary to treat or prevent imminent or life-threatening deterioration. Critical care was time spent personally by me on the following activities: development of treatment plan with patient and/or surrogate as well as nursing, discussions with consultants, evaluation of patient's response to treatment, examination of patient, obtaining history from patient or surrogate, ordering and performing treatments and interventions, ordering and review of laboratory studies, ordering and review of radiographic studies, pulse oximetry and re-evaluation of patient's condition.  Final Clinical  Impressions(s) / ED Diagnoses   Final diagnoses:  Cerebrovascular accident (CVA), unspecified mechanism [facility])    ED Discharge Orders    None       Davonna Belling, MD 08/30/17 1625

## 2017-08-30 NOTE — Progress Notes (Signed)
CODE STROKE CALLED AND BEEPER 1518 STARTED 1526 FINISHED/SENT TO [place]

## 2017-08-30 NOTE — ED Notes (Signed)
Teleneuro notified CODE Stroke

## 2017-08-30 NOTE — ED Notes (Signed)
PT brought back to ED room 2 at this time from CT Scan. Teleneurologist on screen and started assessment at this time.

## 2017-08-30 NOTE — Consult Note (Signed)
[facility] TeleNeurology Consult Services  Impression:  Acute Ischemic Stroke - patient presented to the ED with right facial droop and dysarthria. NIHSS is 2. Presentation concerning for acute stroke. He is outside the therapeutic window for IV tPA. Non contrast CTH shows a hypodensity on the left MCA distribution, likely involving the distal  branches. CTA showed no LVO. Etiology of stroke probably embolic Recommend admission for further work-up.  Not a tpa candidate due to: LSN > 4.5 hours  Does not meet LVO screening criteria (no aphasia, neglect, gaze deviation, dense hemiparesis, or visual field deficits on exam), therefore advanced imaging is not indicated.  Comments:  Door Time: 15:08  [facility] contacted: 15:35  [facility] at bedside: 15:42  NIHSS assessment start time (time the consultation begins): 15:50  Last known well time (LKW): 9:00  Recommendations: Admit for further work-up Start antiplatelet therapy with aspirin 325 mg  if no obvious contraindication Stroke protocol admission/ orderset suggested with placement on stroke floor Tele monitoring Consider inpatient neurology consultation Discussed with ED MD  Please call with questions  -----------------------------------------------------------------------------------------  CC Stroke  History of Present Illness  Patient is a 62 years old man who presents to the ED after wife found him with right facial droop and speech difficulty. He was LKW at 9:30am before wife left the house to go to church. Patient can't tell exactly when the symptoms started. Symptoms have remained constant. He has no other associated symptoms. Takes aspirin daily. No prior history of stroke.  Diagnostic:  Non contrast CTH:  acute left MCA territory infarct  CTA: no LVO CTP: no core infarct  Exam:  Patient is in no apparent distress. Patient appears as stated age. No obvious acute respiratory or cardiac distress.  Patient is well groomed and well-nourished.  NIHSS score: 2 1A: Level of Consciousness - Alert; keenly responsive 0 1B: Ask Month and Age - Both Questions Right 0 1C: 'Blink Eyes' & 'Squeeze Hands' - Performs Both Tasks 0 2: Test Horizontal Extraocular Movements - Normal 0 3: Test Visual Fields - No Visual Loss 0 4: Test Facial Palsy - Minor paralysis (flat nasolabial fold, smile asymetry) +1 5A: Test Left Arm Motor Drift - No Drift for 10 Seconds 0 5B: Test Right Arm Motor Drift - No Drift for 10 Seconds 0 6A: Test Left Leg Motor Drift - No Drift for 5 Seconds 0 6B: Test Right Leg Motor Drift - No Drift for 5 Seconds 0 7: Test Limb Ataxia - No Ataxia 0 8: Test Sensation - Normal; No sensory loss 0 9: Test Language/Aphasia - Normal; No aphasia 0 10: Test Dysarthria - Mild-Moderate Dysarthria: Slurring but can be understood +1 11: Test Extinction/Inattention - No abnormality 0   Medical Decision Making:  - Extensive number of diagnosis or management options are considered above.  - Extensive amount of complex data reviewed.  - High risk of complication and/or morbidity or mortality are associated with differential diagnostic considerations above.  - There may be Uncertain outcome and increased probability of prolonged functional impairment or high probability of severe prolonged functional impairment associated with some of these differential diagnosis.  Medical Data Reviewed:  1.Data reviewed include clinical labs, radiology,Medical Tests;  2.Tests results discussed w/performing or interpreting physician;  3.Obtaining/reviewing old medical records;  4.Obtaining case history from another source;  5.Independent review of image, tracing or specimen.  Patient was informed the Neurology Consult would happen via TeleHealth consult by way of interactive audio and video telecommunications and consented to receiving care  in this manner.

## 2017-08-30 NOTE — ED Notes (Addendum)
Dr. Alvino Chapel notified of pt's symptoms.

## 2017-08-31 ENCOUNTER — Observation Stay (HOSPITAL_BASED_OUTPATIENT_CLINIC_OR_DEPARTMENT_OTHER): Payer: Medicare Other

## 2017-08-31 ENCOUNTER — Observation Stay (HOSPITAL_COMMUNITY): Payer: Medicare Other

## 2017-08-31 DIAGNOSIS — I351 Nonrheumatic aortic (valve) insufficiency: Secondary | ICD-10-CM

## 2017-08-31 DIAGNOSIS — I639 Cerebral infarction, unspecified: Secondary | ICD-10-CM | POA: Diagnosis not present

## 2017-08-31 DIAGNOSIS — E785 Hyperlipidemia, unspecified: Secondary | ICD-10-CM | POA: Diagnosis not present

## 2017-08-31 DIAGNOSIS — I10 Essential (primary) hypertension: Secondary | ICD-10-CM

## 2017-08-31 DIAGNOSIS — R471 Dysarthria and anarthria: Secondary | ICD-10-CM | POA: Diagnosis not present

## 2017-08-31 LAB — LIPID PANEL
CHOL/HDL RATIO: 8.1 ratio
CHOLESTEROL: 195 mg/dL (ref 0–200)
HDL: 24 mg/dL — ABNORMAL LOW (ref >40)
LDL CALC: 147 mg/dL — AB (ref 0–99)
Triglycerides: 121 mg/dL (ref <150)
VLDL: 24 mg/dL (ref 0–40)

## 2017-08-31 LAB — ECHOCARDIOGRAM COMPLETE
HEIGHTINCHES: 71 in
WEIGHTICAEL: 2608 [oz_av]

## 2017-08-31 LAB — HEMOGLOBIN A1C
Hgb A1c MFr Bld: 5.3 % (ref 4.8–5.6)
Mean Plasma Glucose: 105.41 mg/dL

## 2017-08-31 MED ORDER — PANTOPRAZOLE SODIUM 40 MG PO TBEC
40.0000 mg | DELAYED_RELEASE_TABLET | Freq: Every day | ORAL | Status: DC
  Administered 2017-08-31: 40 mg via ORAL
  Filled 2017-08-31: qty 1

## 2017-08-31 MED ORDER — ATORVASTATIN CALCIUM 40 MG PO TABS: 40.0000 mg | ORAL_TABLET | Freq: Every day | ORAL | 1 refills | Status: AC

## 2017-08-31 MED ORDER — TAPENTADOL HCL ER 50 MG PO TB12
250.0000 mg | ORAL_TABLET | Freq: Two times a day (BID) | ORAL | Status: DC
  Administered 2017-08-31: 250 mg via ORAL
  Filled 2017-08-31 (×7): qty 5

## 2017-08-31 MED ORDER — PREGABALIN 75 MG PO CAPS
150.0000 mg | ORAL_CAPSULE | Freq: Three times a day (TID) | ORAL | Status: DC
  Administered 2017-08-31 (×2): 150 mg via ORAL
  Filled 2017-08-31 (×3): qty 2

## 2017-08-31 MED ORDER — OXYCODONE HCL 5 MG PO TABS
10.0000 mg | ORAL_TABLET | Freq: Four times a day (QID) | ORAL | Status: DC
  Administered 2017-08-31 (×3): 10 mg via ORAL
  Filled 2017-08-31 (×3): qty 2

## 2017-08-31 MED ORDER — ATORVASTATIN CALCIUM 40 MG PO TABS
40.0000 mg | ORAL_TABLET | Freq: Every day | ORAL | Status: DC
  Administered 2017-08-31: 40 mg via ORAL
  Filled 2017-08-31: qty 1

## 2017-08-31 MED ORDER — CLOPIDOGREL BISULFATE 75 MG PO TABS: 75.0000 mg | ORAL_TABLET | Freq: Every day | ORAL | 1 refills | Status: AC

## 2017-08-31 MED ORDER — CLOPIDOGREL BISULFATE 75 MG PO TABS
75.0000 mg | ORAL_TABLET | Freq: Every day | ORAL | Status: DC
Start: 2017-08-31 — End: 2017-08-31
  Administered 2017-08-31: 75 mg via ORAL
  Filled 2017-08-31: qty 1

## 2017-08-31 MED ORDER — ONDANSETRON HCL 4 MG/2ML IJ SOLN
4.0000 mg | Freq: Once | INTRAMUSCULAR | Status: AC
  Administered 2017-08-31: 4 mg via INTRAVENOUS

## 2017-08-31 MED ORDER — ONDANSETRON HCL 4 MG/2ML IJ SOLN
4.0000 mg | Freq: Four times a day (QID) | INTRAMUSCULAR | Status: DC | PRN
  Administered 2017-08-31: 4 mg via INTRAVENOUS
  Filled 2017-08-31 (×2): qty 2

## 2017-08-31 NOTE — Discharge Instructions (Signed)
Aspiration Precautions, Adult Aspiration is the breathing in (inhalation) of a liquid or object into the lungs. Things that can be inhaled into the lungs include:  Food.  Any type of liquid, such as drinks or saliva.  Stomach contents, such as vomit or stomach acid.  What are the signs of aspiration? Signs of aspiration include:  Coughing after swallowing food or liquids.  Clearing the throat often while eating.  Trouble breathing. This may include: ? Breathing quickly. ? Breathing very slowly. ? Loud breathing. ? Rumbling sounds from the lungs while breathing.  Coughing up phlegm (sputum) that: ? Is yellow, tan, or green. ? Has pieces of food in it. ? Is bad-smelling.  Having a hoarse, barky cough.  Not being able to speak.  A hoarse voice.  Drooling while eating.  A feeling of fullness in the throat or a feeling that something is stuck in the throat.  Choking often.  Having a runny noise while eating.  Coughing when lying down or having to sit up quickly after lying down.  A change in skin color. The skin may look red or blue.  Fever.  Watery eyes.  Pain in the chest or back.  A pained look on the face.  What are the complications of aspiration? Complications of aspiration include:  Losing weight because the person is not absorbing needed nutrients.  Loss of enjoyment and the social benefits of eating.  Choking.  Lung irritation, if someone aspirates acidic food or drinks.  Lung infection (pneumonia).  Collection of infected liquid (pus) in the lungs (lung abscess).  In serious cases, death can occur. What can I do to prevent aspiration? Caring for someone who has a feeding tube If you are caring for someone who has a feeding tube who cannot eat or drink safely through his or her mouth:  Keep the person in an upright position as much as possible.  Do not lay the person flat if he or she is getting continuous feedings. If you need to lay the  person flat for any reason, turn the feeding pump off.  Check feeding tube residuals as told by your health care provider. Ask your health care provider what residual amount is too high.  Caring for someone who can eat and drink safely by mouth If you are caring for someone who can eat and drink safely through his or her mouth:  Have the person sit in an upright position when eating food or drinking fluids. This can be done in two ways: ? Have the person sit up in a chair. ? If sitting in a chair is not possible, position the person in bed so he or she is upright.  Remind the person to eat slowly and chew well. Make sure the person is awake and alert while eating.  Do not distract the person. This is especially important for people with thinking or memory (cognitive) problems.  Allow foods to cool. Hot foods may be more difficult to swallow.  Provide small meals more frequently, instead of 3 large meals. This may reduce fatigue during eating.  Check the person's mouth thoroughly for leftover food after eating.  Keep the person sitting upright for 30-45 minutes after eating.  Do not serve food or drink during 2 hours or more before bedtime.  General instructions Follow these general guidelines to prevent aspiration in someone who can eat and drink safely by mouth:  Never put food or liquids in the mouth of a person who is  not fully alert.  Feed small amounts of food. Do not force feed.  For a person who is on a diet for swallowing difficulty (dysphagia diet), follow the recommended food and drink consistency. For example, in dysphagia diet level 1, thicken liquids to pudding-like consistency.  Use as little water as possible when brushing the person's teeth or cleaning his or her mouth.  Provide oral care before and after meals.  Use adaptive devices such as cut-out cups, straws, or utensils as told by the health care provider.  Crush pills and put them in soft food such as  pudding or ice cream. Some pills should not be crushed. Check with the health care provider before crushing any medicine.  Contact a health care provider if:  The person has a feeding tube, and the feeding tube residual amount is too high.  The person has a fever.  The person tries to avoid food or water, such as refusing to eat, drink, or be fed, or is eating less than normal.  The person may have aspirated food or liquid.  You notice warning signs, such as choking or coughing, when the person eats or drinks. Get help right away if:  The person has trouble breathing or starts to breathe quickly.  The person is breathing very slowly or stops breathing.  The person coughs a lot after eating or drinking.  The person has a long-lasting (chronic) cough.  The person coughs up thick, yellow, or tan sputum.  If someone is choking on food or an object, perform the Heimlich maneuver (abdominal thrusts).  The person has symptoms of pneumonia, such as: ? Coughing a lot. ? Coughing up mucus with a bad smell or blood in it. ? Feeling short of breath. ? Complaining of chest pain. ? Sweating, fever, and chills. ? Feeling tired. ? Complaining of trouble breathing. ? Wheezing.  The person cannot stop choking.  The person is unable to breathe, turns blue, faints, or seems confused. These symptoms may represent a serious problem that is an emergency. Do not wait to see if the symptoms will go away. Get medical help right away. Call your local emergency services (911 in the [place]  Summary  Aspiration is the breathing in (inhalation) of a liquid or object into the lungs. Things that can be inhaled into the lungs include food, liquids, saliva, or stomach contents.  Aspiration can cause pneumonia or choking.  One sign of aspiration is coughing after swallowing food or liquids.  Contact a health care provider if you notice signs of aspiration. This information is not intended to replace  advice given to you by your health care provider. Make sure you discuss any questions you have with your health care provider. Document Released: 02/01/2010 Document Revised: 09/27/2015 Document Reviewed: 09/27/2015 Elsevier Interactive Patient Education  2018 [employer]  

## 2017-08-31 NOTE — Care Management (Signed)
Acute CVA. From home with wife, ind with ADL's. Has PCP, insurance and transportation. PT recommends no PT follow up. SLP recommends OP SLP. Pt would like to go to [facility]. Referral sent by this CM.

## 2017-08-31 NOTE — Evaluation (Signed)
Clinical/Bedside Swallow Evaluation Patient Details  Name: Dylan Farley MRN: [id number] Date of Birth: 1955-09-19  Today's Date: 08/31/2017 Time: SLP Start Time (ACUTE ONLY): 69 SLP Stop Time (ACUTE ONLY): 1255 SLP Time Calculation (min) (ACUTE ONLY): 25 min  Past Medical History:  Past Medical History:  Diagnosis Date  . Depressive disorder, not elsewhere classified   . Facet syndrome, lumbar   . Hypertension   . Lesion of sciatic nerve   . Lumbosacral spondylosis without myelopathy   . Other chronic postoperative pain   . Sciatic neuritis   . Spinal stenosis, lumbar region, without neurogenic claudication    Past Surgical History:  Past Surgical History:  Procedure Laterality Date  . KNEE SURGERY     HPI:  Dylan Farley is a 62 y.o. male with medical history significant of hypertension comes in with right facial droop and slurred speech since 930 this morning.  He has no previous history of stroke.  Has some dysarthria.  His right facial droop and speech is getting better throughout the day.  He takes a baby aspirin a day.  He denies any numbness tingling anywhere else.  He denies any weakness in any of his extremities.  He is ambulating normally.  Patient is being referred for admission for acute stroke. MRI shows Acute nonhemorrhagic infarct left posterior frontal lobe.   Assessment / Plan / Recommendation Clinical Impression  Pt presents with moderate oral phase dysphagia characterized by right facial asymmetry and reduced lingual strength. Pt without overt signs or symptoms of aspiration thins, however reportedly had difficulty swallowing pills with water this AM and Pt with reduced lingual manipulation and mastication resulting in significant oral residue. Pt's spouse reports that he typically had some pocketing with solids due to edentulous status. Pt and spouse educated about aspiration precautions in setting of acute CVA. Recommend D3 (mech soft) and thin liquids with  aspiration precautions and po meds whole in puree. Pt with dysarthria and likely aphasia, will follow for SLE. Pt and spouse in agreement with plan of care. SLP Visit Diagnosis: Dysphagia, oral phase (R13.11)    Aspiration Risk  Mild aspiration risk    Diet Recommendation Dysphagia 3 (Mech soft);Thin liquid   Liquid Administration via: Cup Medication Administration: Whole meds with puree Supervision: Patient able to self feed Compensations: Slow rate;Small sips/bites;Lingual sweep for clearance of pocketing Postural Changes: Seated upright at 90 degrees;Remain upright for at least 30 minutes after po intake    Other  Recommendations Oral Care Recommendations: Oral care BID Other Recommendations: Clarify dietary restrictions   Follow up Recommendations Outpatient SLP      Frequency and Duration            Prognosis Prognosis for Safe Diet Advancement: Good      Swallow Study   General Date of Onset: 08/30/17 HPI: Dylan Farley is a 62 y.o. male with medical history significant of hypertension comes in with right facial droop and slurred speech since 930 this morning.  He has no previous history of stroke.  Has some dysarthria.  His right facial droop and speech is getting better throughout the day.  He takes a baby aspirin a day.  He denies any numbness tingling anywhere else.  He denies any weakness in any of his extremities.  He is ambulating normally.  Patient is being referred for admission for acute stroke. MRI shows Acute nonhemorrhagic infarct left posterior frontal lobe. Type of Study: Bedside Swallow Evaluation Previous Swallow Assessment: None Diet Prior  to this Study: Regular;Thin liquids Temperature Spikes Noted: No Respiratory Status: Room air History of Recent Intubation: No Behavior/Cognition: Alert;Cooperative;Pleasant mood Oral Cavity Assessment: Within Functional Limits Oral Care Completed by SLP: No Oral Cavity - Dentition: Edentulous Vision: Functional  for self-feeding Self-Feeding Abilities: Able to feed self Patient Positioning: Upright in bed Baseline Vocal Quality: Normal Volitional Cough: Strong Volitional Swallow: Able to elicit    Oral/Motor/Sensory Function Overall Oral Motor/Sensory Function: Moderate impairment Facial ROM: Reduced right Facial Symmetry: Abnormal symmetry right;Suspected CN VII (facial) dysfunction Facial Strength: Reduced right Lingual ROM: Reduced right;Suspected CN XII (hypoglossal) dysfunction Lingual Symmetry: Abnormal symmetry right;Suspected CN XII (hypoglossal) dysfunction Lingual Strength: Reduced;Suspected CN XII (hypoglossal) dysfunction Lingual Sensation: Within Functional Limits Velum: Within Functional Limits Mandible: Within Functional Limits   Ice Chips Ice chips: Within functional limits Presentation: Spoon   Thin Liquid Thin Liquid: Within functional limits Presentation: Cup;Self Fed;Straw    Nectar Thick Nectar Thick Liquid: Not tested   Honey Thick Honey Thick Liquid: Not tested   Puree Puree: Within functional limits Presentation: Self Fed;Spoon   Solid     Solid: Impaired Presentation: Self Fed Oral Phase Impairments: Reduced lingual movement/coordination;Impaired mastication Oral Phase Functional Implications: Prolonged oral transit;Impaired mastication;Oral residue     Thank you,  Genene Churn, [place] 08/31/2017,1:27 PM

## 2017-08-31 NOTE — ACP (Advance Care Planning) (Signed)
Shared Advance Directive information with [name] and his wife. Will follow up tomorrow.

## 2017-08-31 NOTE — Progress Notes (Signed)
*  PRELIMINARY RESULTS* Echocardiogram 2D Echocardiogram has been performed.  Dylan Farley 08/31/2017, 10:24 AM

## 2017-08-31 NOTE — Progress Notes (Signed)
Patient complaining of nausea. No other complaints or new deficits. MD made aware. Orders placed and followed. Will continue to monitor.

## 2017-08-31 NOTE — Progress Notes (Signed)
OT Cancellation Note  Patient Details Name: ARRICK DUTTON MRN: [id number] DOB: [birth year]   Cancelled Treatment:    Reason Eval/Treat Not Completed: Patient at procedure or test/ unavailable. Pt currently receiving MRI at this time. Will re-attempt when able.   Ailene Ravel, OTR/L,CBIS  [phone]  08/31/2017, 8:26 AM

## 2017-08-31 NOTE — Discharge Summary (Signed)
Discharge Summary  Dylan Farley ATF:[id number] DOB: 1955-11-27  PCP: Celene Squibb, MD  Admit date: 08/30/2017 Discharge date: 08/31/2017  Time spent: 25 minutes  Recommendations for Outpatient Follow-up:  1. New medication: Plavix 75 mg p.o. daily 2. Patient will follow-up outpatient with speech therapy  Discharge Diagnoses:  Active Hospital Problems   Diagnosis Date Noted  . CVA (cerebral vascular accident) ([place]) 08/30/2017  . Hypertension     Resolved Hospital Problems  No resolved problems to display.    Discharge Condition: Improved, being discharged home  Diet recommendation: Dysphagia 3 diet with thin liquids, hold meds in pure, aspiration precautions  Vitals:   08/31/17 1315 08/31/17 1515  BP: 116/63 121/72  Pulse: 70 88  Resp: 18 18  Temp: 98.3 F (36.8 C) 98.1 F (36.7 C)  SpO2: 99% 98%    History of present illness:  62 year old male with past mental history of hypertension presented on the morning of 8/18 with complaints of right facial droop and slurred speech that occurred starting that morning.  No previous history of stroke.  Patient also had trouble with word finding.  As the day progressed, some symptoms improved slightly although had not fully resolved.  In the emergency room, CT scan of the head unremarkable.  Patient took a daily aspirin already.  No other symptoms such as weakness on either side.  No confusion.  Hospital Course:  Principal Problem:   CVA (cerebral vascular accident) [facility]): Following admission, patient underwent MRI on 8/19 which noted an acute nonhemorrhagic infarct in the left posterior frontal lobe.  Probably secondary to small vessel disease.  Echocardiogram unrevealing.  Patient already on daily aspirin.  Plavix added.  A1c normal.  Counseled on smoking.  Fasting lipid panel noted LDL of 147 and started on Lipitor.  Seen and cleared by speech therapy and Occupational Therapy.  See below in regard to dysphagia and dysarthric  speech Active Problems: Healing.   Hypertension: Mildly elevated.  Dysarthria/dysphagia: Seen by speech therapy.  Placed on dysphagia 3 diet with thin liquids, home meds and pure.  Patient with outpatient follow-up with speech therapy Tobacco abuse: Counseled  Procedures:  Echocardiogram done 8/19:  Carotid Dopplers done 8/19: No significant  Consultations:  Neurology  Discharge Exam: BP 121/72   Pulse 88   Temp 98.1 F (36.7 C)   Resp 18   Ht 5\' 11"  (1.803 m)   Wt 73.9 kg   SpO2 98%   BMI 22.73 kg/m   General: Alert and oriented x3, no acute distress Cardiovascular: Regular rate and rhythm, S1-S2 Respiratory: Clear to auscultation bilaterally  Discharge Instructions You were cared for by a hospitalist during your hospital stay. If you have any questions about your discharge medications or the care you received while you were in the hospital after you are discharged, you can call the unit and asked to speak with the hospitalist on call if the hospitalist that took care of you is not available. Once you are discharged, your primary care physician will handle any further medical issues. Please note that NO REFILLS for any discharge medications will be authorized once you are discharged, as it is imperative that you return to your primary care physician (or establish a relationship with a primary care physician if you do not have one) for your aftercare needs so that they can reassess your need for medications and monitor your lab values.  Discharge Instructions    Ambulatory referral to Speech Therapy   Complete by:  As directed    DIET DYS 3   Complete by:  As directed    Medicines to be given whole in puree Aspiration precautions   Fluid consistency:  Thin   Increase activity slowly   Complete by:  As directed      Allergies as of 08/31/2017   No Known Allergies     Medication List    STOP taking these medications   aspirin EC 81 MG tablet     TAKE these  medications   atorvastatin 40 MG tablet Commonly known as:  LIPITOR Take 1 tablet (40 mg total) by mouth daily at 6 PM.   clopidogrel 75 MG tablet Commonly known as:  PLAVIX Take 1 tablet (75 mg total) by mouth daily.   losartan 50 MG tablet Commonly known as:  COZAAR Take 50 mg by mouth daily.   NUCYNTA ER 250 MG Tb12 Generic drug:  Tapentadol HCl Take 250 mg by mouth 2 (two) times daily.   NUCYNTA 100 MG Tabs Generic drug:  Tapentadol HCl Take 100 mg by mouth 4 (four) times daily.   omeprazole 20 MG capsule Commonly known as:  PRILOSEC Take 1 capsule (20 mg total) by mouth daily.   pregabalin 150 MG capsule Commonly known as:  LYRICA Take 1 capsule (150 mg total) by mouth 3 (three) times daily.       No Known Allergies    The results of significant diagnostics from this hospitalization (including imaging, microbiology, ancillary and laboratory) are listed below for reference.    Significant Diagnostic Studies: Ct Angio Head W Or Wo Contrast  Result Date: 08/30/2017 CLINICAL DATA:  62 year old male code stroke with abnormal speech and right facial droop. Evidence of small left inferior frontal gyrus cortical infarct on noncontrast head CT 1530 hours. EXAM: CT ANGIOGRAPHY HEAD AND NECK CT PERFUSION BRAIN TECHNIQUE: Multidetector CT imaging of the head and neck was performed using the standard protocol during bolus administration of intravenous contrast. Multiplanar CT image reconstructions and MIPs were obtained to evaluate the vascular anatomy. Carotid stenosis measurements (when applicable) are obtained utilizing NASCET criteria, using the distal internal carotid diameter as the denominator. Multiphase CT imaging of the brain was performed following IV bolus contrast injection. Subsequent parametric perfusion maps were calculated using RAPID software. CONTRAST:  139mL ISOVUE-370 IOPAMIDOL (ISOVUE-370) INJECTION 76% COMPARISON:  Head CT 1530 hours today. FINDINGS: CT Brain  Perfusion Findings: ASPECTS of 9 on the comparison. CBF (<30%) Volume: zero Perfusion (Tmax>6.0s) volume: zero according to the Rapid algorithm; however, on the Tmax color maps (series 1201, image 5) there is visually a small area of prolonged T-max corresponding to the same area of cortical hypodensity seen by plain CT. Mismatch Volume: Not applicable CTA NECK Skeleton: Absent dentition. No acute osseous abnormality identified. Lower cervical spine degeneration. Upper chest: No superior mediastinal lymphadenopathy. Paraseptal emphysema with apical lung scarring greater on the right. Occasional calcified granuloma. Mild dependent atelectasis. Other neck: Negative. Aortic arch: 3 vessel arch configuration. Mild soft and calcified arch atherosclerosis. Right carotid system: Minimal brachiocephalic and right CCA origin soft plaque without stenosis. Soft plaque at the right carotid bifurcation continuing into the posterior right ICA bulb with prominent low-density soft plaque in the distal bulb but no stenosis (series 8, image 66). Left carotid system: Negative left CCA origin. Mild soft plaque at the left carotid bifurcation but somewhat bulky soft plaque at the distal left ICA bulb similar to the right side (series 8, image 136), but no associated stenosis.  Vertebral arteries: No proximal right subclavian artery stenosis despite mild soft plaque. Normal right vertebral artery origin. Normal right vertebral artery to the skull base. No proximal left subclavian artery stenosis despite soft and calcified plaque. Soft and calcified plaque near the left vertebral artery origin results in only mild stenosis (series 7, image 138). Otherwise normal left vertebral artery to the skull base. CTA HEAD Posterior circulation: Relatively codominant distal vertebral arteries without stenosis. Normal PICA origins. Patent vertebrobasilar junction. Patent basilar artery without stenosis. Normal SCA and PCA origins. Posterior  communicating arteries are diminutive or absent. Normal bilateral PCA branches. Anterior circulation: Both ICA siphons are patent. Mild bilateral siphon atherosclerosis with no stenosis. Normal ophthalmic artery origins. Patent carotid termini. The left MCA M1 segment and MCA bifurcation are patent. The left MCA M2 branches are patent and normal. No left MCA branch occlusion is identified. Anterior communicating artery and bilateral ACA branches are normal. Right MCA M1 segment, right MCA trifurcation, and right MCA branches are within normal limits. Venous sinuses: Patent. Anatomic variants: None. Review of the MIP images confirms the above findings IMPRESSION: 1. Negative for large vessel occlusion, and CTP does not detect core infarct, although scrutiny of the Tmax map does demonstrate a small area of delayed perfusion corresponding to the plain CT finding today (series 1201, image 5). I therefore suspect a small left MCA infarct such as from very distal branch occlusion. I identify no left MCA branch occlusion by CTA. 2. Of note, the patient does have low-density soft plaque in both ICA bulbs. There is no significant carotid stenosis. 3. The above discussed by telephone with Dr. Davonna Belling on 08/30/2017 at 16:10 . 4. Apical lung scarring and distortion. Electronically Signed   By: Genevie Ann M.D.   On: 08/30/2017 16:15   Ct Angio Neck W Or Wo Contrast  Result Date: 08/30/2017 CLINICAL DATA:  62 year old male code stroke with abnormal speech and right facial droop. Evidence of small left inferior frontal gyrus cortical infarct on noncontrast head CT 1530 hours. EXAM: CT ANGIOGRAPHY HEAD AND NECK CT PERFUSION BRAIN TECHNIQUE: Multidetector CT imaging of the head and neck was performed using the standard protocol during bolus administration of intravenous contrast. Multiplanar CT image reconstructions and MIPs were obtained to evaluate the vascular anatomy. Carotid stenosis measurements (when applicable) are  obtained utilizing NASCET criteria, using the distal internal carotid diameter as the denominator. Multiphase CT imaging of the brain was performed following IV bolus contrast injection. Subsequent parametric perfusion maps were calculated using RAPID software. CONTRAST:  127mL ISOVUE-370 IOPAMIDOL (ISOVUE-370) INJECTION 76% COMPARISON:  Head CT 1530 hours today. FINDINGS: CT Brain Perfusion Findings: ASPECTS of 9 on the comparison. CBF (<30%) Volume: zero Perfusion (Tmax>6.0s) volume: zero according to the Rapid algorithm; however, on the Tmax color maps (series 1201, image 5) there is visually a small area of prolonged T-max corresponding to the same area of cortical hypodensity seen by plain CT. Mismatch Volume: Not applicable CTA NECK Skeleton: Absent dentition. No acute osseous abnormality identified. Lower cervical spine degeneration. Upper chest: No superior mediastinal lymphadenopathy. Paraseptal emphysema with apical lung scarring greater on the right. Occasional calcified granuloma. Mild dependent atelectasis. Other neck: Negative. Aortic arch: 3 vessel arch configuration. Mild soft and calcified arch atherosclerosis. Right carotid system: Minimal brachiocephalic and right CCA origin soft plaque without stenosis. Soft plaque at the right carotid bifurcation continuing into the posterior right ICA bulb with prominent low-density soft plaque in the distal bulb but no stenosis (series 8,  image 66). Left carotid system: Negative left CCA origin. Mild soft plaque at the left carotid bifurcation but somewhat bulky soft plaque at the distal left ICA bulb similar to the right side (series 8, image 136), but no associated stenosis. Vertebral arteries: No proximal right subclavian artery stenosis despite mild soft plaque. Normal right vertebral artery origin. Normal right vertebral artery to the skull base. No proximal left subclavian artery stenosis despite soft and calcified plaque. Soft and calcified plaque near  the left vertebral artery origin results in only mild stenosis (series 7, image 138). Otherwise normal left vertebral artery to the skull base. CTA HEAD Posterior circulation: Relatively codominant distal vertebral arteries without stenosis. Normal PICA origins. Patent vertebrobasilar junction. Patent basilar artery without stenosis. Normal SCA and PCA origins. Posterior communicating arteries are diminutive or absent. Normal bilateral PCA branches. Anterior circulation: Both ICA siphons are patent. Mild bilateral siphon atherosclerosis with no stenosis. Normal ophthalmic artery origins. Patent carotid termini. The left MCA M1 segment and MCA bifurcation are patent. The left MCA M2 branches are patent and normal. No left MCA branch occlusion is identified. Anterior communicating artery and bilateral ACA branches are normal. Right MCA M1 segment, right MCA trifurcation, and right MCA branches are within normal limits. Venous sinuses: Patent. Anatomic variants: None. Review of the MIP images confirms the above findings IMPRESSION: 1. Negative for large vessel occlusion, and CTP does not detect core infarct, although scrutiny of the Tmax map does demonstrate a small area of delayed perfusion corresponding to the plain CT finding today (series 1201, image 5). I therefore suspect a small left MCA infarct such as from very distal branch occlusion. I identify no left MCA branch occlusion by CTA. 2. Of note, the patient does have low-density soft plaque in both ICA bulbs. There is no significant carotid stenosis. 3. The above discussed by telephone with Dr. Davonna Belling on 08/30/2017 at 16:10 . 4. Apical lung scarring and distortion. Electronically Signed   By: Genevie Ann M.D.   On: 08/30/2017 16:15   Mr Brain Wo Contrast  Result Date: 08/31/2017 CLINICAL DATA:  Stroke.  Speech abnormality with facial droop EXAM: MRI HEAD WITHOUT CONTRAST TECHNIQUE: Multiplanar, multiecho pulse sequences of the brain and surrounding  structures were obtained without intravenous contrast. COMPARISON:  CTA head 08/30/2017 FINDINGS: Brain: Acute infarct left posterior frontal lobe without hemorrhage. No other acute infarct Image quality degraded by motion. Mild chronic microvascular ischemia in the white matter. Negative for hemorrhage or mass. Ventricle size normal. Vascular: Normal arterial flow voids Skull and upper cervical spine: Negative Sinuses/Orbits: Negative Other: None IMPRESSION: Acute nonhemorrhagic infarct left posterior frontal lobe Mild chronic microvascular ischemic change in the white matter. Electronically Signed   By: Franchot Gallo M.D.   On: 08/31/2017 08:41   US Carotid Bilateral (at [facility] And [facility] Only)  Result Date: 08/31/2017 CLINICAL DATA:  CVA EXAM: BILATERAL CAROTID DUPLEX ULTRASOUND TECHNIQUE: Pearline Cables scale imaging, color Doppler and duplex ultrasound were performed of bilateral carotid and vertebral arteries in the neck. COMPARISON:  None. FINDINGS: Criteria: Quantification of carotid stenosis is based on velocity parameters that correlate the residual internal carotid diameter with NASCET-based stenosis levels, using the diameter of the distal internal carotid lumen as the denominator for stenosis measurement. The following velocity measurements were obtained: RIGHT ICA: 109 cm/sec CCA: 92 cm/sec SYSTOLIC ICA/CCA RATIO:  1.2 ECA: 143 cm/sec LEFT ICA: 115 cm/sec CCA: 88 cm/sec SYSTOLIC ICA/CCA RATIO:  1.3 ECA: 146 cm/sec RIGHT CAROTID ARTERY: Mild smooth  mixed plaque in the upper common carotid artery and bulb. Low resistance internal carotid Doppler pattern is preserved. RIGHT VERTEBRAL ARTERY:  Antegrade. LEFT CAROTID ARTERY: Minimal mixed smooth plaque in the bulb. Mild calcified plaque in the lower internal carotid. Low resistance internal carotid Doppler pattern is preserved. LEFT VERTEBRAL ARTERY:  Antegrade. Upper extremity blood pressures: RIGHT:  LEFT: IMPRESSION: Less than 50% stenosis in the right and left  internal carotid arteries. Electronically Signed   By: Marybelle Killings M.D.   On: 08/31/2017 09:22   Ct Cerebral Perfusion W Contrast  Result Date: 08/30/2017 CLINICAL DATA:  62 year old male code stroke with abnormal speech and right facial droop. Evidence of small left inferior frontal gyrus cortical infarct on noncontrast head CT 1530 hours. EXAM: CT ANGIOGRAPHY HEAD AND NECK CT PERFUSION BRAIN TECHNIQUE: Multidetector CT imaging of the head and neck was performed using the standard protocol during bolus administration of intravenous contrast. Multiplanar CT image reconstructions and MIPs were obtained to evaluate the vascular anatomy. Carotid stenosis measurements (when applicable) are obtained utilizing NASCET criteria, using the distal internal carotid diameter as the denominator. Multiphase CT imaging of the brain was performed following IV bolus contrast injection. Subsequent parametric perfusion maps were calculated using RAPID software. CONTRAST:  187mL ISOVUE-370 IOPAMIDOL (ISOVUE-370) INJECTION 76% COMPARISON:  Head CT 1530 hours today. FINDINGS: CT Brain Perfusion Findings: ASPECTS of 9 on the comparison. CBF (<30%) Volume: zero Perfusion (Tmax>6.0s) volume: zero according to the Rapid algorithm; however, on the Tmax color maps (series 1201, image 5) there is visually a small area of prolonged T-max corresponding to the same area of cortical hypodensity seen by plain CT. Mismatch Volume: Not applicable CTA NECK Skeleton: Absent dentition. No acute osseous abnormality identified. Lower cervical spine degeneration. Upper chest: No superior mediastinal lymphadenopathy. Paraseptal emphysema with apical lung scarring greater on the right. Occasional calcified granuloma. Mild dependent atelectasis. Other neck: Negative. Aortic arch: 3 vessel arch configuration. Mild soft and calcified arch atherosclerosis. Right carotid system: Minimal brachiocephalic and right CCA origin soft plaque without stenosis. Soft  plaque at the right carotid bifurcation continuing into the posterior right ICA bulb with prominent low-density soft plaque in the distal bulb but no stenosis (series 8, image 66). Left carotid system: Negative left CCA origin. Mild soft plaque at the left carotid bifurcation but somewhat bulky soft plaque at the distal left ICA bulb similar to the right side (series 8, image 136), but no associated stenosis. Vertebral arteries: No proximal right subclavian artery stenosis despite mild soft plaque. Normal right vertebral artery origin. Normal right vertebral artery to the skull base. No proximal left subclavian artery stenosis despite soft and calcified plaque. Soft and calcified plaque near the left vertebral artery origin results in only mild stenosis (series 7, image 138). Otherwise normal left vertebral artery to the skull base. CTA HEAD Posterior circulation: Relatively codominant distal vertebral arteries without stenosis. Normal PICA origins. Patent vertebrobasilar junction. Patent basilar artery without stenosis. Normal SCA and PCA origins. Posterior communicating arteries are diminutive or absent. Normal bilateral PCA branches. Anterior circulation: Both ICA siphons are patent. Mild bilateral siphon atherosclerosis with no stenosis. Normal ophthalmic artery origins. Patent carotid termini. The left MCA M1 segment and MCA bifurcation are patent. The left MCA M2 branches are patent and normal. No left MCA branch occlusion is identified. Anterior communicating artery and bilateral ACA branches are normal. Right MCA M1 segment, right MCA trifurcation, and right MCA branches are within normal limits. Venous sinuses: Patent. Anatomic variants: None.  Review of the MIP images confirms the above findings IMPRESSION: 1. Negative for large vessel occlusion, and CTP does not detect core infarct, although scrutiny of the Tmax map does demonstrate a small area of delayed perfusion corresponding to the plain CT finding  today (series 1201, image 5). I therefore suspect a small left MCA infarct such as from very distal branch occlusion. I identify no left MCA branch occlusion by CTA. 2. Of note, the patient does have low-density soft plaque in both ICA bulbs. There is no significant carotid stenosis. 3. The above discussed by telephone with Dr. Davonna Belling on 08/30/2017 at 16:10 . 4. Apical lung scarring and distortion. Electronically Signed   By: Genevie Ann M.D.   On: 08/30/2017 16:15   Ct Head Code Stroke Wo Contrast`  Result Date: 08/30/2017 CLINICAL DATA:  Code stroke. 62 year old male code stroke. Right side facial droop and aphasia. EXAM: CT HEAD WITHOUT CONTRAST TECHNIQUE: Contiguous axial images were obtained from the base of the skull through the vertex without intravenous contrast. COMPARISON:  Cervical spine MRI 04/09/2007. FINDINGS: Brain: There is a subtle area of cortical white matter hypodensity at the left inferior frontal gyrus, likely near the region of Broca's area, best seen on sagittal image 49. No associated hemorrhage or mass effect. No other loss of gray-white matter differentiation identified. Cavum septum pellucidum, normal variant. Scattered bilateral subcortical white matter hypodensity in the frontal lobes. No midline shift, ventriculomegaly, mass effect, evidence of mass lesion. Vascular: No suspicious intracranial vascular hyperdensity. Skull: Negative. Sinuses/Orbits: Clear. Other: Visualized orbit soft tissues are within normal limits. Visualized scalp soft tissues are within normal limits. ASPECTS [facility] Stroke Program Early CT Score) - Ganglionic level infarction (caudate, lentiform nuclei, internal capsule, insula, M1-M3 cortex): 7 - Supraganglionic infarction (M4-M6 cortex): 2 (abnormal M4 segment). Total score (0-10 with 10 being normal): 10 IMPRESSION: 1. Acute left MCA territory infarct with small area of left inferior frontal gyrus cortical white matter hypodensity. No associated  hemorrhage or mass effect. ASPECTS is 9. 2. Study discussed by telephone with Dr. Davonna Belling on 08/30/2017 at 15:46. CTA/CTP is pending. Electronically Signed   By: Genevie Ann M.D.   On: 08/30/2017 15:48    Microbiology: No results found for this or any previous visit (from the past 240 hour(s)).   Labs: Basic Metabolic Panel: Recent Labs  Lab 08/30/17 1522 08/30/17 1534  NA 140 141  K 3.7 3.8  CL 107 105  CO2 26  --   GLUCOSE 113* 109*  BUN 19 19  CREATININE 0.83 0.80  CALCIUM 8.9  --    Liver Function Tests: Recent Labs  Lab 08/30/17 1522  AST 18  ALT 17  ALKPHOS 79  BILITOT 0.7  PROT 7.5  ALBUMIN 4.6   No results for input(s): LIPASE, AMYLASE in the last 168 hours. No results for input(s): AMMONIA in the last 168 hours. CBC: Recent Labs  Lab 08/30/17 1522 08/30/17 1534  WBC 6.5  --   NEUTROABS 4.3  --   HGB 14.0 13.6  HCT 41.1 40.0  MCV 90.9  --   PLT 98*  --    Cardiac Enzymes: No results for input(s): CKTOTAL, CKMB, CKMBINDEX, TROPONINI in the last 168 hours. BNP: BNP (last 3 results) No results for input(s): BNP in the last 8760 hours.  ProBNP (last 3 results) No results for input(s): PROBNP in the last 8760 hours.  CBG: Recent Labs  Lab 08/30/17 1521  GLUCAP 118*  Signed:  Annita Brod, MD [facility] 08/31/2017, 5:06 PM

## 2017-08-31 NOTE — Evaluation (Signed)
Physical Therapy Evaluation Patient Details Name: Dylan Farley MRN: [id number] DOB: 22-Oct-1955 Today's Date: 08/31/2017   History of Present Illness  Dylan Farley is a 62 y.o. male with medical history significant of hypertension comes in with right facial droop and slurred speech since 930 this morning.  He has no previous history of stroke.  Has some dysarthria.  His right facial droop and speech is getting better throughout the day.  He takes a baby aspirin a day.  He denies any numbness tingling anywhere else.  He denies any weakness in any of his extremities.  He is ambulating normally.  Patient is being referred for admission for acute stroke.    Clinical Impression  Patient functioning at baseline for functional mobility and gait.  Patient discharged to care of nursing for ambulation daily as tolerated for length of stay.    Follow Up Recommendations No PT follow up    Equipment Recommendations  None recommended by PT    Recommendations for Other Services       Precautions / Restrictions Precautions Precautions: None Restrictions Weight Bearing Restrictions: No      Mobility  Bed Mobility Overal bed mobility: Independent                Transfers Overall transfer level: Independent Equipment used: None                Ambulation/Gait Ambulation/Gait assistance: Independent   Assistive device: None Gait Pattern/deviations: WFL(Within Functional Limits) Gait velocity: normal   General Gait Details: no loss balance on level, inclined, declined surfaces  Stairs            Wheelchair Mobility    Modified Rankin (Stroke Patients Only)       Balance Overall balance assessment: No apparent balance deficits (not formally assessed)                                           Pertinent Vitals/Pain Pain Assessment: 0-10 Pain Score: 7  Pain Location: chronic low back Pain Descriptors / Indicators: Aching Pain  Intervention(s): Limited activity within patient's tolerance;Monitored during session    Home Living Family/patient expects to be discharged to:: Private residence Living Arrangements: Spouse/significant other Available Help at Discharge: Family Type of Home: House Home Access: Stairs to enter Entrance Stairs-Rails: None Technical brewer of Steps: 2 Home Layout: One level Home Equipment: None      Prior Function Level of Independence: Independent         Comments: community ambulator, drives     Hand Dominance   Dominant Hand: Right    Extremity/Trunk Assessment   Upper Extremity Assessment Upper Extremity Assessment: Defer to OT evaluation    Lower Extremity Assessment Lower Extremity Assessment: Overall WFL for tasks assessed    Cervical / Trunk Assessment Cervical / Trunk Assessment: Normal  Communication   Communication: Expressive difficulties  Cognition Arousal/Alertness: Awake/alert Behavior During Therapy: WFL for tasks assessed/performed Overall Cognitive Status: Within Functional Limits for tasks assessed                                        General Comments      Exercises     Assessment/Plan    PT Assessment Patent does not need any further PT services  PT  Problem List         PT Treatment Interventions      PT Goals (Current goals can be found in the Care Plan section)  Acute Rehab PT Goals Patient Stated Goal: return home PT Goal Formulation: With patient/family Time For Goal Achievement: 08/31/17 Potential to Achieve Goals: Good    Frequency     Barriers to discharge        Co-evaluation               AM-PAC PT "6 Clicks" Daily Activity  Outcome Measure Difficulty turning over in bed (including adjusting bedclothes, sheets and blankets)?: None Difficulty moving from lying on back to sitting on the side of the bed? : None Difficulty sitting down on and standing up from a chair with arms (e.g.,  wheelchair, bedside commode, etc,.)?: None Help needed moving to and from a bed to chair (including a wheelchair)?: None Help needed walking in hospital room?: None Help needed climbing 3-5 steps with a railing? : None 6 Click Score: 24    End of Session   Activity Tolerance: Patient tolerated treatment well Patient left: in bed;with call bell/phone within reach;with family/visitor present Nurse Communication: Mobility status PT Visit Diagnosis: Unsteadiness on feet (R26.81);Other abnormalities of gait and mobility (R26.89);Muscle weakness (generalized) (M62.81)    Time: 7782-4235 PT Time Calculation (min) (ACUTE ONLY): 22 min   Charges:   PT Evaluation $PT Eval Moderate Complexity: 1 Mod PT Treatments $Therapeutic Activity: 8-22 mins        2:12 PM, 08/31/17 Lonell Grandchild, MPT Physical Therapist with [facility] [phone] office [phone] mobile phone

## 2017-08-31 NOTE — Consult Note (Addendum)
Dylan A. Merlene Laughter, MD     [email or web address]          Dylan Farley is an 62 y.o. male.   ASSESSMENT/PLAN: 1.  Acute left MCA branch infarct presented with severe dysarthria likely also expressive aphasia: Risk factors hypertension, dyslipidemia, nicotine use, possible old myocardial infarction on echo and age.  This is a cryptogenic stroke and therefore additional work-up is recommended.  A 30-day cardiac event monitor is recommended.  Speech therapy has been set up.  The patient has been switched from aspirin to Plavix which is appropriate.  Nicotine cessation is recommended and discussed with the patient.  The patient's dyslipidemia treatment could be optimized.  Follow-up in the office in 6 weeks with [place].    The patient is a 62 year old right-handed white male who presents with the acute onset of severe speaking impairment.  He also reports having right lower facial weakness.  He denies any focal weakness involving the upper extremities.  Apart from the severe dysarthria, he indicates that he has been able to comprehend and say what he wants to say but this has difficulties where the words coming out.  No chest pain or shortness of breath or palpitations are reported.  He apparently has been on aspirin 81 mg at home and has been compliant with this.  There is no previous history of myocardial infarction.  He does have a chronic pain syndrome for which he takes opioids.  The review of systems otherwise negative.    GENERAL: This is a pleasant thin male in no acute distress.  HEENT: Supple and no trauma appreciated  ABDOMEN: soft  EXTREMITIES: No edema   BACK: Normal  SKIN: Normal by inspection.    MENTAL STATUS: Alert and oriented -including month and age.  He has profound dysarthria to the point that words are very difficult to comprehend.  There possibly could be some mild speech aphasia but this is very difficult to sort out given the profound  dysarthria.  Comprehension is good.  Cognition appears generally unremarkable.  CRANIAL NERVES: Pupils are equal, round and reactive to light and accomodation; extra ocular movements are full, there is no significant nystagmus; visual fields are full; severe weakness right lower facial muscles but preserved after; left side is normal. There is no flattening of the nasolabial folds; tongue is midline; uvula is midline; shoulder elevation is normal.  MOTOR: Normal tone, bulk and strength; no pronator drift.  There is no drift of the upper or lower extremities.  COORDINATION: Left finger to nose is normal, right finger to nose is normal, No rest tremor; no intention tremor; no postural tremor; no bradykinesia.  REFLEXES: Deep tendon reflexes are symmetrical and normal.   SENSATION: Normal to light touch, temperature, and pain.   NIH stroke scale 5.   The brain MRI scan is reviewed and shows several increased signal involving the left middle frontal lobe area.  Both the cortical and subcortical areas are involved.  The area seems consistent with the shower of infarcts.  There are seen on DWI with corresponding reduced signal on the ADC scan.  There is mild to moderate chronic deep white matter changes.  No hemorrhages appreciated.   Blood pressure 121/72, pulse 88, temperature 98.1 F (36.7 C), resp. rate 18, height 5' 11"  (1.803 m), weight 73.9 kg, SpO2 98 %.  Past Medical History:  Diagnosis Date  . Depressive disorder, not elsewhere classified   . Facet syndrome, lumbar   . Hypertension   .  Lesion of sciatic nerve   . Lumbosacral spondylosis without myelopathy   . Other chronic postoperative pain   . Sciatic neuritis   . Spinal stenosis, lumbar region, without neurogenic claudication     Past Surgical History:  Procedure Laterality Date  . KNEE SURGERY      Family History  Problem Relation Age of Onset  . Arthritis Mother   . Diabetes Brother     Social History:  reports  that he has been smoking cigarettes. He has a 45.00 pack-year smoking history. He has never used smokeless tobacco. He reports that he does not drink alcohol or use drugs.  Allergies: No Known Allergies  Medications: Prior to Admission medications   Medication Sig Start Date End Date Taking? Authorizing Provider  aspirin EC 81 MG tablet Take 81 mg by mouth daily.   Yes [provider]  losartan (COZAAR) 50 MG tablet Take 50 mg by mouth daily.   Yes [provider]  omeprazole (PRILOSEC) 20 MG capsule Take 1 capsule (20 mg total) by mouth daily. 01/29/12  Yes Meredith Staggers, MD  pregabalin (LYRICA) 150 MG capsule Take 1 capsule (150 mg total) by mouth 3 (three) times daily. 10/21/11  Yes Meredith Staggers, MD  Tapentadol HCl (NUCYNTA ER) 250 MG TB12 Take 250 mg by mouth 2 (two) times daily.   Yes [provider]  Tapentadol HCl (NUCYNTA) 100 MG TABS Take 100 mg by mouth 4 (four) times daily.   Yes [provider]  atorvastatin (LIPITOR) 40 MG tablet Take 1 tablet (40 mg total) by mouth daily at 6 PM. 08/31/17   Annita Brod, MD  clopidogrel (PLAVIX) 75 MG tablet Take 1 tablet (75 mg total) by mouth daily. 09/01/17   Annita Brod, MD    Scheduled Meds: . aspirin  300 mg Rectal Daily   Or  . aspirin  325 mg Oral Daily  . atorvastatin  40 mg Oral q1800  . clopidogrel  75 mg Oral Daily  . oxyCODONE  10 mg Oral QID  . pantoprazole  40 mg Oral Daily  . pregabalin  150 mg Oral TID  . tapentadol  250 mg Oral BID   Continuous Infusions: PRN Meds:.acetaminophen **OR** acetaminophen (TYLENOL) oral liquid 160 mg/5 mL **OR** acetaminophen, ondansetron (ZOFRAN) IV     Results for orders placed or performed during the hospital encounter of 08/30/17 (from the past 48 hour(s))  CBG monitoring, ED     Status: Abnormal   Collection Time: 08/30/17  3:21 PM  Result Value Ref Range   Glucose-Capillary 118 (H) 70 - 99 mg/dL   Comment 1 Notify RN     Comment 2 Document in Chart   Ethanol     Status: None   Collection Time: 08/30/17  3:22 PM  Result Value Ref Range   Alcohol, Ethyl (B) <10 <10 mg/dL    Comment: (NOTE) Lowest detectable limit for serum alcohol is 10 mg/dL. For medical purposes only. Performed at [facility], [address] [place], [place] [address]   Protime-INR     Status: None   Collection Time: 08/30/17  3:22 PM  Result Value Ref Range   Prothrombin Time 12.7 11.4 - 15.2 seconds   INR 0.96     Comment: Performed at [facility], [address] [place], [place] [address]  APTT     Status: None   Collection Time: 08/30/17  3:22 PM  Result Value Ref Range   aPTT 29 24 -  36 seconds    Comment: Performed at [facility], [address], [place], [place] [address]  CBC     Status: Abnormal   Collection Time: 08/30/17  3:22 PM  Result Value Ref Range   WBC 6.5 4.0 - 10.5 K/uL   RBC 4.52 4.22 - 5.81 MIL/uL   Hemoglobin 14.0 13.0 - 17.0 g/dL   HCT 41.1 39.0 - 52.0 %   MCV 90.9 78.0 - 100.0 fL   MCH 31.0 26.0 - 34.0 pg   MCHC 34.1 30.0 - 36.0 g/dL   RDW 13.2 11.5 - 15.5 %   Platelets 98 (L) 150 - 400 K/uL    Comment: PLATELET COUNT CONFIRMED BY SMEAR SPECIMEN CHECKED FOR CLOTS Performed at [facility], [address], [place], [place] [address]   Differential     Status: None   Collection Time: 08/30/17  3:22 PM  Result Value Ref Range   Neutrophils Relative % 66 %   Neutro Abs 4.3 1.7 - 7.7 K/uL   Lymphocytes Relative 27 %   Lymphs Abs 1.8 0.7 - 4.0 K/uL   Monocytes Relative 7 %   Monocytes Absolute 0.4 0.1 - 1.0 K/uL   Eosinophils Relative 0 %   Eosinophils Absolute 0.0 0.0 - 0.7 K/uL   Basophils Relative 0 %   Basophils Absolute 0.0 0.0 - 0.1 K/uL    Comment: Performed at [facility], [address], [place], [place] [address]  Comprehensive metabolic panel     Status: Abnormal   Collection Time: 08/30/17  3:22 PM  Result Value Ref Range   Sodium 140 135 - 145 mmol/L   Potassium 3.7 3.5 - 5.1  mmol/L   Chloride 107 98 - 111 mmol/L   CO2 26 22 - 32 mmol/L   Glucose, Bld 113 (H) 70 - 99 mg/dL   BUN 19 8 - 23 mg/dL   Creatinine, Ser 0.83 0.61 - 1.24 mg/dL   Calcium 8.9 8.9 - 10.3 mg/dL   Total Protein 7.5 6.5 - 8.1 g/dL   Albumin 4.6 3.5 - 5.0 g/dL   AST 18 15 - 41 U/L   ALT 17 0 - 44 U/L   Alkaline Phosphatase 79 38 - 126 U/L   Total Bilirubin 0.7 0.3 - 1.2 mg/dL   GFR calc non Af Amer >60 >60 mL/min   GFR calc Af Amer >60 >60 mL/min    Comment: (NOTE) The eGFR has been calculated using the CKD EPI equation. This calculation has not been validated in all clinical situations. eGFR's persistently <60 mL/min signify possible Chronic Kidney Disease.    Anion gap 7 5 - 15    Comment: Performed at [facility], [address], [place], [place] [address]  I-stat troponin, ED     Status: None   Collection Time: 08/30/17  3:32 PM  Result Value Ref Range   Troponin i, poc 0.01 0.00 - 0.08 ng/mL   Comment 3            Comment: Due to the release kinetics of cTnI, a negative result within the first hours of the onset of symptoms does not rule out myocardial infarction with certainty. If myocardial infarction is still suspected, repeat the test at appropriate intervals.   I-Stat Chem 8, ED     Status: Abnormal   Collection Time: 08/30/17  3:34 PM  Result Value Ref Range   Sodium 141 135 - 145 mmol/L   Potassium 3.8 3.5 - 5.1 mmol/L   Chloride 105  98 - 111 mmol/L   BUN 19 8 - 23 mg/dL   Creatinine, Ser 0.80 0.61 - 1.24 mg/dL   Glucose, Bld 109 (H) 70 - 99 mg/dL   Calcium, Ion 1.15 1.15 - 1.40 mmol/L   TCO2 24 22 - 32 mmol/L   Hemoglobin 13.6 13.0 - 17.0 g/dL   HCT 40.0 39.0 - 52.0 %  Urine rapid drug screen (hosp performed)     Status: None   Collection Time: 08/30/17  6:20 PM  Result Value Ref Range   Opiates NONE DETECTED NONE DETECTED   Cocaine NONE DETECTED NONE DETECTED   Benzodiazepines NONE DETECTED NONE DETECTED   Amphetamines NONE DETECTED NONE DETECTED    Tetrahydrocannabinol NONE DETECTED NONE DETECTED   Barbiturates NONE DETECTED NONE DETECTED    Comment: (NOTE) DRUG SCREEN FOR MEDICAL PURPOSES ONLY.  IF CONFIRMATION IS NEEDED FOR ANY PURPOSE, NOTIFY LAB WITHIN 5 DAYS. LOWEST DETECTABLE LIMITS FOR URINE DRUG SCREEN Drug Class                     Cutoff (ng/mL) Amphetamine and metabolites    1000 Barbiturate and metabolites    200 Benzodiazepine                 010 Tricyclics and metabolites     300 Opiates and metabolites        300 Cocaine and metabolites        300 THC                            50 Performed at [facility], [address], [place], [place] [address]   Hemoglobin A1c     Status: None   Collection Time: 08/31/17  5:50 AM  Result Value Ref Range   Hgb A1c MFr Bld 5.3 4.8 - 5.6 %    Comment: (NOTE) Pre diabetes:          5.7%-6.4% Diabetes:              >6.4% Glycemic control for   <7.0% adults with diabetes    Mean Plasma Glucose 105.41 mg/dL    Comment: Performed at [facility] [address], [place], [place] [address]  Lipid panel     Status: Abnormal   Collection Time: 08/31/17  5:50 AM  Result Value Ref Range   Cholesterol 195 0 - 200 mg/dL   Triglycerides 121 <150 mg/dL   HDL 24 (L) >40 mg/dL   Total CHOL/HDL Ratio 8.1 RATIO   VLDL 24 0 - 40 mg/dL   LDL Cholesterol 147 (H) 0 - 99 mg/dL    Comment:        Total Cholesterol/HDL:CHD Risk Coronary Heart Disease Risk Table                     Men   Women  1/2 Average Risk   3.4   3.3  Average Risk       5.0   4.4  2 X Average Risk   9.6   7.1  3 X Average Risk  23.4   11.0        Use the calculated Patient Ratio above and the CHD Risk Table to determine the patient's CHD Risk.        ATP III CLASSIFICATION (LDL):  <100     mg/dL   Optimal  100-129  mg/dL   Near or Above  Optimal  130-159  mg/dL   Borderline  160-189  mg/dL   High  >190     mg/dL   Very High Performed at [facility], [address],  [place], [place] [address]     Studies/Results:  TTE - Left ventricle: The cavity size was normal. Systolic function was 11  at the lower limits of normal. The estimated ejection fraction   was 50%. Findings consistent with left ventricular diastolic   dysfunction, grade indeterminate. Doppler parameters are   consistent with high ventricular filling pressure. Moderate basal   septal hypertrophy. - Regional wall motion abnormality: Mild hypokinesis of the   mid-apical anterior, mid anteroseptal, mid anterolateral, and   apical lateral myocardium. - Aortic valve: There was mild to moderate regurgitation. - Mitral valve: Mildly thickened leaflets . - Atrial septum: No defect or patent foramen ovale was identified. - Tricuspid valve: There was mild-moderate regurgitation. - Pulmonary arteries: PA peak pressure: 33 mm Hg (S).   CAROTID DOPPLERS IMPRESSION: Less than 50% stenosis in the right and left internal carotid Arteries.    BRAIN MRI FINDINGS: Brain: Acute infarct left posterior frontal lobe without hemorrhage. No other acute infarct  Image quality degraded by motion. Mild chronic microvascular ischemia in the white matter. Negative for hemorrhage or mass. Ventricle size normal.  Vascular: Normal arterial flow voids  Skull and upper cervical spine: Negative  Sinuses/Orbits: Negative  Other: None  IMPRESSION: Acute nonhemorrhagic infarct left posterior frontal lobe  Mild chronic microvascular ischemic change in the white matter.    HEAD NECK CTA IMPRESSION: 1. Negative for large vessel occlusion, and CTP does not detect core infarct, although scrutiny of the Tmax map does demonstrate a small area of delayed perfusion corresponding to the plain CT finding today (series 1201, image 5). I therefore suspect a small left MCA infarct such as from very distal branch occlusion. I identify no left MCA branch occlusion by CTA. 2. Of note, the patient does have  low-density soft plaque in both ICA bulbs. There is no significant carotid stenosis.               Tylie Golonka A. Merlene Farley, M.D.  Diplomate, Tax adviser of Psychiatry and Neurology ( Neurology). 08/31/2017, 7:03 PM

## 2017-08-31 NOTE — Care Management Obs Status (Signed)
[place] NOTIFICATION   Patient Details  Name: SHREYAN HINZ MRN: [id number] Date of Birth: [birth year]   Medicare Observation Status Notification Given:  Yes    Sherald Barge, RN 08/31/2017, 3:59 PM

## 2017-08-31 NOTE — Evaluation (Signed)
Speech Language Pathology Evaluation Patient Details Name: Dylan Farley MRN: [id number] DOB: 11/11/1955 Today's Date: 08/31/2017 Time: 2836-6294 SLP Time Calculation (min) (ACUTE ONLY): 29 min  Problem List:  Patient Active Problem List   Diagnosis Date Noted  . CVA (cerebral vascular accident) ([place]) 08/30/2017  . Hypertension   . Lumbar spondylosis 05/23/2011  . Lumbar facet arthropathy 05/23/2011   Past Medical History:  Past Medical History:  Diagnosis Date  . Depressive disorder, not elsewhere classified   . Facet syndrome, lumbar   . Hypertension   . Lesion of sciatic nerve   . Lumbosacral spondylosis without myelopathy   . Other chronic postoperative pain   . Sciatic neuritis   . Spinal stenosis, lumbar region, without neurogenic claudication    Past Surgical History:  Past Surgical History:  Procedure Laterality Date  . KNEE SURGERY     HPI:  Dylan Farley is a 62 y.o. male with medical history significant of hypertension comes in with right facial droop and slurred speech since 930 this morning.  He has no previous history of stroke.  Has some dysarthria.  His right facial droop and speech is getting better throughout the day.  He takes a baby aspirin a day.  He denies any numbness tingling anywhere else.  He denies any weakness in any of his extremities.  He is ambulating normally.  Patient is being referred for admission for acute stroke. MRI shows Acute nonhemorrhagic infarct left posterior frontal lobe.   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 His receptive and expressive language (naming and word finding) skills appear WFL, however difficulty to fully assess in this short duration and due to severity of dysarthria. Cognition appears intact and Pt demonstrated good safety awareness  and planning skills. Recommend outpatient SLP therapy to address speech deficits and further assess cognitive linguistic skills. Pt and spouse are in agreement with plan of care.     SLP Assessment  SLP Recommendation/Assessment: All further Speech Lanaguage Pathology  needs can be addressed in the next venue of care SLP Visit Diagnosis: Dysarthria and anarthria (R47.1);Aphasia (R47.01)    Follow Up Recommendations  Outpatient SLP    Frequency and Duration           SLP Evaluation Cognition  Overall Cognitive Status: Within Functional Limits for tasks assessed Arousal/Alertness: Awake/alert Orientation Level: Oriented X4 Memory: Appears intact Awareness: Appears intact Problem Solving: Appears intact Safety/Judgment: Appears intact       Comprehension  Auditory Comprehension Overall Auditory Comprehension: Appears within functional limits for tasks assessed Yes/No Questions: Within Functional Limits Commands: Within Functional Limits Conversation: Complex Visual Recognition/Discrimination Discrimination: Within Function Limits Reading Comprehension Reading Status: Within funtional limits    Expression Expression Primary Mode of Expression: Verbal Verbal Expression Overall Verbal Expression: Impaired Initiation: No impairment Automatic Speech: Name;Social Response;Counting Level of Generative/Spontaneous Verbalization: Sentence Repetition: Impaired Level of Impairment: Word level Naming: No impairment Pragmatics: No impairment Written Expression Dominant Hand: Right Written Expression: Within Functional Limits   Oral / Motor  Oral Motor/Sensory Function Overall Oral Motor/Sensory Function: Moderate impairment Facial ROM: Reduced right Facial Symmetry: Abnormal symmetry right;Suspected CN VII (facial) dysfunction Facial Strength: Reduced right Lingual ROM: Reduced right;Suspected CN XII (hypoglossal) dysfunction Lingual Symmetry: Abnormal symmetry right;Suspected  CN XII (hypoglossal) dysfunction Lingual Strength: Reduced;Suspected CN XII (hypoglossal) dysfunction Lingual Sensation: Within Functional Limits Velum: Within Functional  Limits Mandible: Within Functional Limits Motor Speech Overall Motor Speech: Impaired Respiration: Within functional limits Phonation: Normal Resonance: Within functional limits Articulation: Impaired Level of Impairment: Word Intelligibility: Intelligibility reduced Word: 25-49% accurate Phrase: 25-49% accurate Sentence: 25-49% accurate Conversation: 25-49% accurate Motor Planning: Impaired Level of Impairment: Word Motor Speech Errors: Aware Interfering Components: Inadequate dentition Effective Techniques: Slow rate;Over-articulate;Pause   Thank you,  Genene Churn, [employer]                     [place] 08/31/2017, 4:37 PM

## 2017-08-31 NOTE — Progress Notes (Signed)
Lollie Sails discharged Home per MD order.  Discharge instructions reviewed and discussed with the patient, all questions and concerns answered. Copy of instructions and scripts given to patient.  Allergies as of 08/31/2017   No Known Allergies     Medication List    STOP taking these medications   aspirin EC 81 MG tablet     TAKE these medications   atorvastatin 40 MG tablet Commonly known as:  LIPITOR Take 1 tablet (40 mg total) by mouth daily at 6 PM.   clopidogrel 75 MG tablet Commonly known as:  PLAVIX Take 1 tablet (75 mg total) by mouth daily. Start taking on:  09/01/2017   losartan 50 MG tablet Commonly known as:  COZAAR Take 50 mg by mouth daily.   NUCYNTA ER 250 MG Tb12 Generic drug:  Tapentadol HCl Take 250 mg by mouth 2 (two) times daily.   NUCYNTA 100 MG Tabs Generic drug:  Tapentadol HCl Take 100 mg by mouth 4 (four) times daily.   omeprazole 20 MG capsule Commonly known as:  PRILOSEC Take 1 capsule (20 mg total) by mouth daily.   pregabalin 150 MG capsule Commonly known as:  LYRICA Take 1 capsule (150 mg total) by mouth 3 (three) times daily.       Patients skin is clean, dry and intact, no evidence of skin break down. IV site discontinued and catheter remains intact. Site without signs and symptoms of complications. Dressing and pressure applied.  Patient escorted to the elevator,  no distress noted upon discharge.  Ralene Muskrat Jaiana Sheffer 08/31/2017 7:08 PM

## 2017-09-02 ENCOUNTER — Encounter (HOSPITAL_COMMUNITY): Payer: Self-pay | Admitting: Speech Pathology

## 2017-09-02 ENCOUNTER — Ambulatory Visit (HOSPITAL_COMMUNITY): Payer: Medicare Other | Attending: Internal Medicine | Admitting: Speech Pathology

## 2017-09-02 ENCOUNTER — Other Ambulatory Visit: Payer: Self-pay

## 2017-09-02 DIAGNOSIS — R471 Dysarthria and anarthria: Secondary | ICD-10-CM | POA: Diagnosis not present

## 2017-09-02 DIAGNOSIS — R1311 Dysphagia, oral phase: Secondary | ICD-10-CM | POA: Diagnosis present

## 2017-09-02 NOTE — Therapy (Signed)
[name], [place], [address] Phone: [phone]   Fax:  [phone]  Speech Language Pathology Evaluation  Patient Details  Name: Dylan Farley MRN: [id number] Date of Birth: 22-Sep-1955 Referring Provider: Gevena Barre, MD   Encounter Date: 09/02/2017  End of Session - 09/02/17 1215    Visit Number  1    Number of Visits  9    Date for SLP Re-Evaluation  10/08/17    Authorization Type  [employer]HC Medicare   co-pay $20, OOP $4000 based on medical necessity   SLP Start Time  1115    SLP Stop Time   1200    SLP Time Calculation (min)  45 min    Activity Tolerance  Patient tolerated treatment well       Past Medical History:  Diagnosis Date  . Depressive disorder, not elsewhere classified   . Facet syndrome, lumbar   . Hypertension   . Lesion of sciatic nerve   . Lumbosacral spondylosis without myelopathy   . Other chronic postoperative pain   . Sciatic neuritis   . Spinal stenosis, lumbar region, without neurogenic claudication     Past Surgical History:  Procedure Laterality Date  . KNEE SURGERY      There were no vitals filed for this visit.  Subjective Assessment - 09/02/17 1208    Subjective  "It is a little better."    Patient is accompained by:  Family member    Currently in Pain?  No/denies         SLP Evaluation [facility] - 09/02/17 1208      SLP Visit Information   SLP Received On  09/02/17    Referring Provider  Gevena Barre, MD    Onset Date  08/30/2017    Medical Diagnosis  CVA      General Information   HPI  Dylan Farley is a 62 y.o. male with medical history significant of hypertension comes in with right facial droop and slurred speech since 930 this morning.  He has no previous history of stroke.  Has some dysarthria.  His right facial droop and speech is getting better throughout the day.  He takes a baby aspirin a day.  He denies any numbness tingling anywhere else.  He  denies any weakness in any of his extremities.  He is ambulating normally.  Patient is being referred for admission for acute stroke. MRI shows Acute nonhemorrhagic infarct left posterior frontal lobe.    Behavioral/Cognition  Alert and cooperative    Mobility Status  ambulatory      Balance Screen   Has the patient fallen in the past 6 months  No    Has the patient had a decrease in activity level because of a fear of falling?   No    Is the patient reluctant to leave their home because of a fear of falling?   No      Prior Functional Status   Cognitive/Linguistic Baseline  Within functional limits    Type of Home  House     Lives With  Spouse    Available Support  Family    Education  GED    Vocation  Retired      Pain Assessment   Pain Assessment  No/denies pain      Cognition   Overall Cognitive Status  Within Functional Limits for tasks assessed    Memory  Appears intact    Awareness  Appears  intact    Problem Solving  Appears intact      Auditory Comprehension   Overall Auditory Comprehension  Appears within functional limits for tasks assessed    Yes/No Questions  Within Functional Limits    Commands  Within Functional Limits    Conversation  Complex      Visual Recognition/Discrimination   Discrimination  Within Function Limits      Reading Comprehension   Reading Status  Within funtional limits      Expression   Primary Mode of Expression  Verbal      Verbal Expression   Overall Verbal Expression  Impaired    Initiation  No impairment    Automatic Speech  Name;Social Response;Counting    Level of Generative/Spontaneous Verbalization  Conversation    Repetition  Impaired    Level of Impairment  Word level    Naming  No impairment    Pragmatics  No impairment    Interfering Components  Speech intelligibility;Anatomical limitation    Effective Techniques  Articulatory cues    Non-Verbal Means of Communication  Not applicable      Written Expression    Dominant Hand  Right    Written Expression  Within Functional Limits      Oral Motor/Sensory Function   Overall Oral Motor/Sensory Function  Impaired    Labial ROM  Reduced right    Labial Symmetry  Abnormal symmetry right    Labial Strength  Within Functional Limits    Labial Sensation  Within Functional Limits    Labial Coordination  WFL    Lingual ROM  Reduced right    Lingual Symmetry  Within Functional Limits    Lingual Strength  Reduced    Lingual Sensation  Within Functional Limits    Lingual Coordination  Reduced    Facial ROM  Within Functional Limits    Facial Symmetry  Within Functional Limits    Facial Strength  Within Functional Limits    Facial Sensation  Within Functional Limits    Facial Coordination  WFL    Velum  Within Functional Limits    Mandible  Within Functional Limits      Motor Speech   Overall Motor Speech  Impaired    Respiration  Within functional limits    Phonation  Normal    Resonance  Within functional limits    Articulation  Impaired    Level of Impairment  Word    Intelligibility  Intelligibility reduced    Word  50-74% accurate    Phrase  50-74% accurate    Sentence  50-74% accurate    Conversation  50-74% accurate    Motor Planning  Witnin functional limits    Motor Speech Errors  Aware    Interfering Components  Inadequate dentition    Effective Techniques  Slow rate;Over-articulate;Pause    Phonation  [facility]        SLP Education - 09/02/17 1213    Education Details  Provided list of soft foods and aspiration precautions    Person(s) Educated  Patient;Child(ren)    Methods  Explanation;Handout    Comprehension  Verbalized understanding       SLP Short Term Goals - 09/02/17 1227      SLP SHORT TERM GOAL #1   Title  Pt will increase speech intelligibility to 90% intelligible in conversations in 1:1 conversations with SLP with min prompts for strategies and cue for error awareness.    Baseline  1  Time  4    Period  Weeks     Status  New    Target Date  10/08/17      SLP SHORT TERM GOAL #2   Title  Pt will implement fluency enhancing and speech intelligibility strategies when generating sentences involving multisyllabic words with 90% acc and min assist.    Baseline  1    Time  4    Period  Weeks    Status  New    Target Date  10/08/17      SLP SHORT TERM GOAL #3   Title  Pt will complete OMEs and speech exercises daily at home per Pt report.    Baseline  No plan in place   Time  4    Period  Weeks    Status  New    Target Date  10/08/17       SLP Long Term Goals - 09/02/17 1230      SLP LONG TERM GOAL #1   Title  Pt will increase speech intelligibility to [facility] for small group setting conversation and 1:1 phone calls with use of compensatory strategies as needed.    Baseline  mod assist    Time  4    Period  Weeks    Status  New    Target Date  10/08/17      SLP LONG TERM GOAL #2   Title  Pt will demonstrate safe and efficient consumption of regular textures and thin liquids with use of strategies as needed.     Baseline  some coughing with large sips, difficulty with bolus manipulation with solids    Time  4    Period  Weeks    Status  New    Target Date  10/08/17       Plan - 09/02/17 1223    Clinical Impression Statement Pt accompanied to the evaluation by his daughter. Pt presents with moderate flaccid dysarthria which negatively impacts speech intelligibility at the word, sentence, and conversation level. Speech intelligibility improved for known contexts and with use of over articulation strategy. He was given a letter board in the hospital to further assist in expressive communication. Pt does not appear to have any language deficits and only slight signs of verbal apraxia. Pt with occasional cough when taking large sips of thin liquids and has difficulty manipulating solids textures orally. SLP will continue to monitor need for objective assessment, however Pt compensates by taking small  sips. Recommend skilled SLP therapy to address deficits above and increase independence with communication in the community.   Speech Therapy Frequency  2x / week    Duration  4 weeks    Treatment/Interventions  Aspiration precaution training;Cueing hierarchy;Oral motor exercises;SLP instruction and feedback;Diet toleration management by SLP;Patient/family education;Compensatory strategies;Functional tasks;Compensatory techniques;Trials of upgraded texture/liquids    Potential to Achieve Goals  Good    Potential Considerations  Severity of impairments    SLP Home Exercise Plan  Pt will be independent with HEP as assigned to facilitate carryover of treatment strategies and techniques in home environment.    Consulted and Agree with Plan of Care  Patient;Family member/caregiver    Family Member Consulted  daughter       Patient will benefit from skilled therapeutic intervention in order to improve the following deficits and impairments:   Dysarthria and anarthria  Dysphagia, oral phase    Problem List Patient Active Problem List   Diagnosis Date Noted  . Hyperlipidemia LDL goal <  70   . 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 CVA (cerebral vascular accident) ([place]) 08/30/2017  . Hypertension   . Lumbar spondylosis 05/23/2011  . Lumbar facet arthropathy 05/23/2011   Thank you,  Genene Churn, [place]  [facility] 09/02/2017, 12:33 PM  [place] [address] [place], [place], [address] Phone: [phone]   Fax:  [phone]  Name: Dylan Farley MRN: [id number] Date of Birth: 1955/01/22

## 2017-09-08 ENCOUNTER — Encounter (HOSPITAL_COMMUNITY): Payer: Self-pay | Admitting: Speech Pathology

## 2017-09-08 ENCOUNTER — Ambulatory Visit (HOSPITAL_COMMUNITY): Payer: Medicare Other | Admitting: Speech Pathology

## 2017-09-08 DIAGNOSIS — R471 Dysarthria and anarthria: Secondary | ICD-10-CM

## 2017-09-08 NOTE — Therapy (Signed)
[place], [place], [address] Phone: [phone]   Fax:  [phone]  Speech Language Pathology Treatment  Patient Details  Name: Dylan Farley MRN: [id number] Date of Birth: 1955-11-30 Referring Provider: Gevena Barre, MD   Encounter Date: 09/08/2017  End of Session - 09/08/17 1523    Visit Number  2    Number of Visits  9    Date for SLP Re-Evaluation  10/08/17    Authorization Type  [employer]HC Medicare   co-pay $20, OOP $4000 based on medical necessity   SLP Start Time  1430    SLP Stop Time   1518    SLP Time Calculation (min)  48 min    Activity Tolerance  Patient tolerated treatment well       Past Medical History:  Diagnosis Date  . Depressive disorder, not elsewhere classified   . Facet syndrome, lumbar   . Hypertension   . Lesion of sciatic nerve   . Lumbosacral spondylosis without myelopathy   . Other chronic postoperative pain   . Sciatic neuritis   . Spinal stenosis, lumbar region, without neurogenic claudication     Past Surgical History:  Procedure Laterality Date  . KNEE SURGERY      There were no vitals filed for this visit.  Subjective Assessment - 09/08/17 1458    Subjective  "I think I am doing a lot better."    Currently in Pain?  Yes    Pain Score  5     Pain Location  Back       ADULT SLP TREATMENT - 09/08/17 1459      General Information   Behavior/Cognition  Alert;Cooperative;Pleasant mood    Patient Positioning  Upright in chair    Oral care provided  N/A    HPI  ADRIN Farley is a 62 y.o. male with medical history significant of hypertension comes in with right facial droop and slurred speech since 930 this morning.  He has no previous history of stroke.  Has some dysarthria.  His right facial droop and speech is getting better throughout the day.  He takes a baby aspirin a day.  He denies any numbness tingling anywhere else.  He denies any weakness in any of his  extremities.  He is ambulating normally.  Patient is being referred for admission for acute stroke. MRI shows Acute nonhemorrhagic infarct left posterior frontal lobe.       Treatment Provided   Treatment provided  Cognitive-Linquistic      Pain Assessment   Pain Assessment  0-10    Pain Score  5     Pain Location  chronic low back    Pain Descriptors / Indicators  Aching      Cognitive-Linquistic Treatment   Treatment focused on  Dysarthria    Skilled Treatment  SLP reviewed oral motor and repetitive sound exercises for lingual sounds, speech intelligibility exercises      Assessment / Recommendations / Plan   Plan  Continue with current plan of care      Progression Toward Goals   Progression toward goals  Progressing toward goals         SLP Short Term Goals - 09/08/17 1524      SLP SHORT TERM GOAL #1   Title  Pt will increase speech intelligibility to 90% intelligible in conversations in 1:1 conversations with SLP with min prompts for strategies and cue for error awareness.  Baseline  1    Time  4    Period  Weeks    Status  On-going      SLP SHORT TERM GOAL #2   Title  Pt will implement fluency enhancing and speech intelligibility strategies when generating sentences involving multisyllabic words with 90% acc and min assist.    Baseline  1    Time  4    Period  Weeks    Status  On-going      SLP SHORT TERM GOAL #3   Title  Pt will complete OMEs and speech exercises daily at home per Pt report    Baseline  no plan    Time  4    Period  Weeks    Status  On-going    Target Date  10/08/17       SLP Long Term Goals - 09/08/17 1524      SLP LONG TERM GOAL #1   Title  Pt will increase speech intelligibility to [facility] for small group setting conversation and 1:1 phone calls with use of compensatory strategies as needed.    Baseline  mod assist    Time  4    Period  Weeks    Status  On-going      SLP LONG TERM GOAL #2   Title  Pt will demonstrate safe and  efficient consumption of regular textures and thin liquids with use of strategies as needed.     Baseline  some coughing with large sips, difficulty with bolus manipulation with solids    Time  4    Period  Weeks    Status  On-going       Plan - 09/08/17 1524    Clinical Impression Statement Pt reports improvement in swallowing and speech since evaluation last week. He acknowledges that he needs to continue to take small sips for liquids. He completed OMEs assigned after the evaluation and reports increased lingual movement and ease of oral bolus manipulation. Pt asked to orally read single words, bisyllabic words, multisyllabic words, and sentences. Pt benefited from initial cues for speech intelligibility strategies (focus on over articulation and pacing). Pt provided with minimal pair words to practice for homework. SLP to further target with minimal pairs next session.    Speech Therapy Frequency  2x / week    Duration  4 weeks    Treatment/Interventions  Aspiration precaution training;Cueing hierarchy;Oral motor exercises;SLP instruction and feedback;Diet toleration management by SLP;Patient/family education;Compensatory strategies;Functional tasks;Compensatory techniques;Trials of upgraded texture/liquids    Potential to Achieve Goals  Good    Potential Considerations  Severity of impairments    SLP Home Exercise Plan  Pt will be independent with HEP as assigned to facilitate carryover of treatment strategies and techniques in home environment.    Consulted and Agree with Plan of Care  Patient;Family member/caregiver    Family Member Consulted  daughter       Patient will benefit from skilled therapeutic intervention in order to improve the following deficits and impairments:   Dysarthria and anarthria    Problem List Patient Active Problem List   Diagnosis Date Noted  . Hyperlipidemia LDL goal <70   . 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  CVA (cerebral vascular accident) ([place]) 08/30/2017  . Hypertension   . Lumbar spondylosis 05/23/2011  . Lumbar facet arthropathy 05/23/2011   Thank you,  Genene Churn, [place]  [place] 09/08/2017, 6:24 PM  [facility]  [place], [place], [address] Phone: [phone]   Fax:  [phone]   Name: CHA GOMILLION MRN: [id number] Date of Birth: 1955/03/14

## 2017-09-10 ENCOUNTER — Ambulatory Visit (HOSPITAL_COMMUNITY): Payer: Medicare Other | Admitting: Speech Pathology

## 2017-09-10 ENCOUNTER — Encounter (HOSPITAL_COMMUNITY): Payer: Self-pay | Admitting: Speech Pathology

## 2017-09-10 DIAGNOSIS — R471 Dysarthria and anarthria: Secondary | ICD-10-CM | POA: Diagnosis not present

## 2017-09-10 NOTE — Therapy (Signed)
[place], [place], [address] Phone: [phone]   Fax:  [phone]  Speech Language Pathology Treatment  Patient Details  Name: Dylan Farley MRN: [id number] Date of Birth: Sep 21, 1955 Referring Provider: Gevena Barre, MD   Encounter Date: 09/10/2017  End of Session - 09/10/17 1559    Visit Number  3    Number of Visits  9    Date for SLP Re-Evaluation  10/08/17    Authorization Type  [employer]HC Medicare   co-pay $20, OOP $4000 based on medical necessity   SLP Start Time  1517    SLP Stop Time   1602    SLP Time Calculation (min)  45 min    Activity Tolerance  Patient tolerated treatment well       Past Medical History:  Diagnosis Date  . Depressive disorder, not elsewhere classified   . Facet syndrome, lumbar   . Hypertension   . Lesion of sciatic nerve   . Lumbosacral spondylosis without myelopathy   . Other chronic postoperative pain   . Sciatic neuritis   . Spinal stenosis, lumbar region, without neurogenic claudication     Past Surgical History:  Procedure Laterality Date  . KNEE SURGERY      There were no vitals filed for this visit.  Subjective Assessment - 09/10/17 1543    Subjective  "I spoke to some neighbors today."    Currently in Pain?  No/denies            ADULT SLP TREATMENT - 09/10/17 1543      General Information   Behavior/Cognition  Alert;Cooperative;Pleasant mood    Patient Positioning  Upright in chair    Oral care provided  N/A    HPI  Dylan Farley is a 62 y.o. male with medical history significant of hypertension comes in with right facial droop and slurred speech since 930 this morning.  He has no previous history of stroke.  Has some dysarthria.  His right facial droop and speech is getting better throughout the day.  He takes a baby aspirin a day.  He denies any numbness tingling anywhere else.  He denies any weakness in any of his extremities.  He is ambulating  normally.  Patient is being referred for admission for acute stroke. MRI shows Acute nonhemorrhagic infarct left posterior frontal lobe.       Treatment Provided   Treatment provided  Cognitive-Linquistic      Cognitive-Linquistic Treatment   Treatment focused on  Dysarthria    Skilled Treatment  SLP reviewed oral motor and repetitive sound exercises for lingual sounds, speech intelligibility exercises      Assessment / Recommendations / Plan   Plan  Continue with current plan of care         SLP Short Term Goals - 09/10/17 1600      SLP SHORT TERM GOAL #1   Title  Pt will increase speech intelligibility to 90% intelligible in conversations in 1:1 conversations with SLP with min prompts for strategies and cue for error awareness.    Baseline  1    Time  4    Period  Weeks    Status  On-going      SLP SHORT TERM GOAL #2   Title  Pt will implement fluency enhancing and speech intelligibility strategies when generating sentences involving multisyllabic words with 90% acc and min assist.    Baseline  1    Time  4    Period  Weeks    Status  On-going      SLP SHORT TERM GOAL #3   Title  Pt will complete OMEs and speech exercises daily at home per Pt report    Baseline  no plan    Time  4    Period  Weeks    Status  On-going       SLP Long Term Goals - 09/10/17 1600      SLP LONG TERM GOAL #1   Title  Pt will increase speech intelligibility to [facility] for small group setting conversation and 1:1 phone calls with use of compensatory strategies as needed.    Baseline  mod assist    Time  4    Period  Weeks    Status  On-going      SLP LONG TERM GOAL #2   Title  Pt will demonstrate safe and efficient consumption of regular textures and thin liquids with use of strategies as needed.     Baseline  some coughing with large sips, difficulty with bolus manipulation with solids    Time  4    Period  Weeks    Status  On-going       Plan - 09/10/17 1559    Clinical Impression  Statement Pt reports improvement in speech intelligibility when talking with his neighbors earlier today, but noted reduced intelligibility this afternoon. SLP advised that this was likely due to fatigue. In session, Pt practiced using speech intelligibility strategies (over articulate, slow down, breath support, and adding pauses) during conversational speech, oral reading of paragraphs about the Dylan Farley, and when listing items in a category. Pt asked to pick one strategy to focus on during separate trials while being audio recorded. Pt listened to recording and in agreement with SLP over most effective strategy (over-articulating). Speech intelligibility greatly improved since evaluation for single words and sentences, however continues to be impaired for conversational speech (moderately reduced). Next session, practice implementing over articulation strategy during conversational speech tasks.    Speech Therapy Frequency  2x / week    Duration  4 weeks    Treatment/Interventions  Aspiration precaution training;Cueing hierarchy;Oral motor exercises;SLP instruction and feedback;Diet toleration management by SLP;Patient/family education;Compensatory strategies;Functional tasks;Compensatory techniques;Trials of upgraded texture/liquids    Potential to Achieve Goals  Good    Potential Considerations  Severity of impairments    SLP Home Exercise Plan  Pt will be independent with HEP as assigned to facilitate carryover of treatment strategies and techniques in home environment.    Consulted and Agree with Plan of Care  Patient;Family member/caregiver    Family Member Consulted  daughter       Patient will benefit from skilled therapeutic intervention in order to improve the following deficits and impairments:   Dysarthria and anarthria    Problem List Patient Active Problem List   Diagnosis Date Noted  . Hyperlipidemia LDL goal <70   . Dysarthria due to acute cerebellar cerebrovascular accident  (CVA) ([place])   . CVA (cerebral vascular accident) ([place]) 08/30/2017  . Hypertension   . Lumbar spondylosis 05/23/2011  . Lumbar facet arthropathy 05/23/2011   Thank you,  Dylan Farley, [place]  [facility] 09/10/2017, 4:01 PM  [place] [address] [place], [place], [address] Phone: [phone]   Fax:  [phone]   Name: Dylan Farley MRN: [id number] Date of Birth: 11/02/1955

## 2017-09-15 ENCOUNTER — Encounter (HOSPITAL_COMMUNITY): Payer: Self-pay | Admitting: Speech Pathology

## 2017-09-15 ENCOUNTER — Ambulatory Visit (HOSPITAL_COMMUNITY): Payer: Medicare Other | Attending: Internal Medicine | Admitting: Speech Pathology

## 2017-09-15 DIAGNOSIS — R471 Dysarthria and anarthria: Secondary | ICD-10-CM | POA: Diagnosis not present

## 2017-09-15 NOTE — Therapy (Signed)
[place], [place], [address] Phone: [phone]   Fax:  [phone]  Speech Language Pathology Treatment  Patient Details  Name: Dylan Farley MRN: [id number] Date of Birth: 03-25-1955 Referring Provider: Gevena Barre, MD   Encounter Date: 09/15/2017  End of Session - 09/15/17 1518    Visit Number  4    Number of Visits  9    Date for SLP Re-Evaluation  10/08/17    Authorization Type  UHC Medicare   co-pay $20, OOP $4000 based on medical necessity   SLP Start Time  1430    SLP Stop Time   1515    SLP Time Calculation (min)  45 min    Activity Tolerance  Patient tolerated treatment well       Past Medical History:  Diagnosis Date  . Depressive disorder, not elsewhere classified   . Facet syndrome, lumbar   . Hypertension   . Lesion of sciatic nerve   . Lumbosacral spondylosis without myelopathy   . Other chronic postoperative pain   . Sciatic neuritis   . Spinal stenosis, lumbar region, without neurogenic claudication     Past Surgical History:  Procedure Laterality Date  . KNEE SURGERY      There were no vitals filed for this visit.  Subjective Assessment - 09/15/17 1436    Subjective  "It is better in the morning."    Currently in Pain?  No/denies        ADULT SLP TREATMENT - 09/15/17 1451      General Information   Behavior/Cognition  Alert;Cooperative;Pleasant mood    Patient Positioning  Upright in chair    Oral care provided  N/A    HPI  Dylan Farley is a 62 y.o. male with medical history significant of hypertension comes in with right facial droop and slurred speech since 930 this morning.  He has no previous history of stroke.  Has some dysarthria.  His right facial droop and speech is getting better throughout the day.  He takes a baby aspirin a day.  He denies any numbness tingling anywhere else.  He denies any weakness in any of his extremities.  He is ambulating normally.   Patient is being referred for admission for acute stroke. MRI shows Acute nonhemorrhagic infarct left posterior frontal lobe.       Treatment Provided   Treatment provided  Cognitive-Linquistic      Cognitive-Linquistic Treatment   Treatment focused on  Dysarthria    Skilled Treatment  lingual resistance and ROM exercises completed with SLP guidance, review of speech intelligibility strategies, implementation of strategies during reading of minimal pairs, paragraph reading, and spontaneous conversation      Assessment / Recommendations / [place] with current plan of care      Progression Toward Goals   Progression toward goals  Progressing toward goals       SLP Education - 09/15/17 1518    Education Details  Provided paragraphs for reading at home aloud, tongue depressor for lingual resistance exercises    Person(s) Educated  Patient    Methods  Explanation    Comprehension  Verbalized understanding       SLP Short Term Goals - 09/15/17 1519      SLP SHORT TERM GOAL #1   Title  Pt will increase speech intelligibility to 90% intelligible in conversations in 1:1 conversations with SLP with min prompts for strategies and  cue for error awareness.    Baseline  1    Time  4    Period  Weeks    Status  On-going      SLP SHORT TERM GOAL #2   Title  Pt will implement fluency enhancing and speech intelligibility strategies when generating sentences involving multisyllabic words with 90% acc and min assist.    Baseline  1    Time  4    Period  Weeks    Status  On-going      SLP SHORT TERM GOAL #3   Title  Pt will complete OMEs and speech exercises daily at home per Pt report    Baseline  no plan    Time  4    Period  Weeks    Status  On-going       SLP Long Term Goals - 09/15/17 1520      SLP LONG TERM GOAL #1   Title  Pt will increase speech intelligibility to [facility] for small group setting conversation and 1:1 phone calls with use of compensatory strategies as  needed.    Baseline  mod assist    Time  4    Period  Weeks    Status  On-going      SLP LONG TERM GOAL #2   Title  Pt will demonstrate safe and efficient consumption of regular textures and thin liquids with use of strategies as needed.     Baseline  some coughing with large sips, difficulty with bolus manipulation with solids    Time  4    Period  Weeks    Status  On-going       Plan - 09/15/17 1519    Clinical Impression Statement Speech intelligibility strategies were targeted during structured reading tasks, minimal pairs, and spontaneous conversation. Pt continues to make progress and verbalizes that he needs to "slow down". SLP identified 7 misarticulated/unintelligible words during conversational speech and Pt agreed during review of audio recording. HEP provided. Continue to target goals and will consider d/c from SLP in the next couple of visits as Pt becomes more independent with implementation of strategies.    Speech Therapy Frequency  2x / week    Duration  4 weeks    Treatment/Interventions  Aspiration precaution training;Cueing hierarchy;Oral motor exercises;SLP instruction and feedback;Diet toleration management by SLP;Patient/family education;Compensatory strategies;Functional tasks;Compensatory techniques;Trials of upgraded texture/liquids    Potential to Achieve Goals  Good    Potential Considerations  Severity of impairments    SLP Home Exercise Plan  Pt will be independent with HEP as assigned to facilitate carryover of treatment strategies and techniques in home environment.    Consulted and Agree with Plan of Care  Patient;Family member/caregiver    Family Member Consulted  daughter       Patient will benefit from skilled therapeutic intervention in order to improve the following deficits and impairments:   Dysarthria and anarthria    Problem List Patient Active Problem List   Diagnosis Date Noted  . Hyperlipidemia LDL goal <70   . 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 CVA (cerebral vascular accident) ([place]) 08/30/2017  . Hypertension   . Lumbar spondylosis 05/23/2011  . Lumbar facet arthropathy 05/23/2011   Thank you,  Genene Churn, [place]  Genene Churn 09/15/2017, 3:21 PM  [place] [address] [place], [place], [address] Phone: [phone]   Fax:  [phone]   Name:  Dylan Farley MRN: [id number] Date of Birth: [birth year]

## 2017-09-22 ENCOUNTER — Ambulatory Visit (HOSPITAL_COMMUNITY): Payer: Medicare Other | Admitting: Speech Pathology

## 2017-09-22 ENCOUNTER — Encounter (HOSPITAL_COMMUNITY): Payer: Self-pay | Admitting: Speech Pathology

## 2017-09-22 DIAGNOSIS — R471 Dysarthria and anarthria: Secondary | ICD-10-CM

## 2017-09-22 NOTE — Therapy (Signed)
[place], [place], [address] Phone: [phone]   Fax:  [phone]  Speech Language Pathology Treatment  Patient Details  Name: Dylan Farley MRN: [id number] Date of Birth: [birth year] Referring Provider: Gevena Barre, MD   Encounter Date: 09/22/2017  End of Session - 09/22/17 1532    Visit Number  5    Number of Visits  9    Date for SLP Re-Evaluation  10/08/17    Authorization Type  UHC Medicare   co-pay $20, OOP $4000 based on medical necessity   SLP Start Time  1515    SLP Stop Time   1600    SLP Time Calculation (min)  45 min    Activity Tolerance  Patient tolerated treatment well       Past Medical History:  Diagnosis Date  . Depressive disorder, not elsewhere classified   . Facet syndrome, lumbar   . Hypertension   . Lesion of sciatic nerve   . Lumbosacral spondylosis without myelopathy   . Other chronic postoperative pain   . Sciatic neuritis   . Spinal stenosis, lumbar region, without neurogenic claudication     Past Surgical History:  Procedure Laterality Date  . KNEE SURGERY      There were no vitals filed for this visit.  Subjective Assessment - 09/22/17 1523    Subjective  "I try to slow down."    Currently in Pain?  No/denies       ADULT SLP TREATMENT - 09/22/17 0001      General Information   Behavior/Cognition  Alert;Cooperative;Pleasant mood    Patient Positioning  Upright in chair    Oral care provided  N/A    HPI  Dylan Farley is a 63 y.o. male with medical history significant of hypertension comes in with right facial droop and slurred speech since 930 this morning.  He has no previous history of stroke.  Has some dysarthria.  His right facial droop and speech is getting better throughout the day.  He takes a baby aspirin a day.  He denies any numbness tingling anywhere else.  He denies any weakness in any of his extremities.  He is ambulating normally.  Patient is  being referred for admission for acute stroke. MRI shows Acute nonhemorrhagic infarct left posterior frontal lobe.       Treatment Provided   Treatment provided  Cognitive-Linquistic      Pain Assessment   Pain Assessment  No/denies pain      Cognitive-Linquistic Treatment   Treatment focused on  Dysarthria    Skilled Treatment  lingual resistance and ROM exercises completed with SLP guidance, review of speech intelligibility strategies, implementation of strategies during reading of minimal pairs, paragraph reading, and spontaneous conversation      Assessment / Recommendations / [place] with current plan of care       SLP Education - 09/22/17 1531    Education Details  Provided multisyllabic words to practice at home    Person(s) Educated  Patient    Methods  Explanation;Handout    Comprehension  Verbalized understanding       SLP Short Term Goals - 09/22/17 1533      SLP SHORT TERM GOAL #1   Title  Pt will increase speech intelligibility to 90% intelligible in conversations in 1:1 conversations with SLP with min prompts for strategies and cue for error awareness.    Baseline  1  Time  4    Period  Weeks    Status  Achieved      SLP SHORT TERM GOAL #2   Title  Pt will implement fluency enhancing and speech intelligibility strategies when generating sentences involving multisyllabic words with 90% acc and min assist.    Baseline  1    Time  4    Period  Weeks    Status  Achieved      SLP SHORT TERM GOAL #3   Title  Pt will complete OMEs and speech exercises daily at home per Pt report    Baseline  no plan    Time  4    Period  Weeks    Status  Achieved       SLP Long Term Goals - 09/22/17 1613      SLP LONG TERM GOAL #1   Title  Pt will increase speech intelligibility to [facility] for small group setting conversation and 1:1 phone calls with use of compensatory strategies as needed.    Baseline  mod assist    Time  4    Period  Weeks    Status   Achieved      SLP LONG TERM GOAL #2   Title  Pt will demonstrate safe and efficient consumption of regular textures and thin liquids with use of strategies as needed.     Baseline  some coughing with large sips, difficulty with bolus manipulation with solids    Time  4    Period  Weeks    Status  Achieved       Plan - 09/22/17 1532    Clinical Impression Statement Pt continues to report an improvement in speech intelligibility, however also indicates "it's worse later in the day". SLP assured Pt that this is typical in recovery and to be mindful of this when planning his days. Pt is independent with HEP and he was encouraged to continue with speech exercises to help "re-calibrate" him and as reminders to utilize strategies (decreasing rate and over- articulating). In session, he independently read multisyllabic words 3x each with excellent intelligibility. SLP queried certain words 4x in 15 minute conversational speech task. Pt also consumed 120 ml water without signs/symptoms of aspiration this date. After discussing with Pt, Pt will be discharged this date to home program going forward. He is pleased with his progress.    Speech Therapy Frequency  2x / week    Duration  4 weeks    Treatment/Interventions  Aspiration precaution training;Cueing hierarchy;Oral motor exercises;SLP instruction and feedback;Diet toleration management by SLP;Patient/family education;Compensatory strategies;Functional tasks;Compensatory techniques;Trials of upgraded texture/liquids    Potential to Achieve Goals  Good    Potential Considerations  Severity of impairments    SLP Home Exercise Plan  Pt will be independent with HEP as assigned to facilitate carryover of treatment strategies and techniques in home environment.    Consulted and Agree with Plan of Care  Patient;Family member/caregiver    Family Member Consulted  daughter       Patient will benefit from skilled therapeutic intervention in order to improve  the following deficits and impairments:   Dysarthria and anarthria    Problem List Patient Active Problem List   Diagnosis Date Noted  . Hyperlipidemia LDL goal <70   . 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 CVA (cerebral vascular accident) ([place]) 08/30/2017  . Hypertension   . Lumbar spondylosis 05/23/2011  . Lumbar facet arthropathy 05/23/2011  SPEECH THERAPY DISCHARGE SUMMARY  Visits from Start of Care: 5  Current functional level related to goals / functional outcomes: Pt met short and long term goals.   Remaining deficits: Mildly reduced speech intelligibility due to stroke and also edentulous status; Pt pleased with progress.   Education / Equipment: HEP  Plan: Patient agrees to discharge.  Patient goals were met. Patient is being discharged due to being pleased with the current functional level.  ?????         Thank you,  Genene Churn, [place]  [facility] 09/22/2017, 4:14 PM  [place] [address] [place], [place], [address] Phone: [phone]   Fax:  [phone]   Name: Dylan Farley MRN: [id number] Date of Birth: [birth year]

## 2017-09-24 ENCOUNTER — Encounter (HOSPITAL_COMMUNITY): Payer: Medicare Other | Admitting: Speech Pathology

## 2017-09-29 ENCOUNTER — Encounter (HOSPITAL_COMMUNITY): Payer: Medicare Other | Admitting: Speech Pathology

## 2017-10-01 ENCOUNTER — Encounter (HOSPITAL_COMMUNITY): Payer: Medicare Other | Admitting: Speech Pathology

## 2017-12-22 ENCOUNTER — Telehealth (HOSPITAL_COMMUNITY): Payer: Self-pay | Admitting: Speech Pathology

## 2017-12-22 NOTE — Telephone Encounter (Signed)
Pt came in with bill and paid $40.00 for DOS 8/27 and 8/29 according to billing rep [employer]. Pt had receipts for 8/21 & 9/10 20.00 each. Patient attended 5 visits for speech which would be 100.00 at 20.00 each. Pt has only paid 80.00 that I am aware of.

## 2019-01-11 IMAGING — US US CAROTID DUPLEX BILAT
1 series · 13 of 24 positions shown · non-contrast
Comparison: None.

CLINICAL DATA: CVA

EXAM:
BILATERAL CAROTID DUPLEX ULTRASOUND
TECHNIQUE: Gray scale imaging, color Doppler and duplex ultrasound were
performed of bilateral carotid and vertebral arteries in the neck.

[Series 1: us carotid duplex bilat · 0.05mm/px · 13 of 81 slices shown]
[im 1/81]
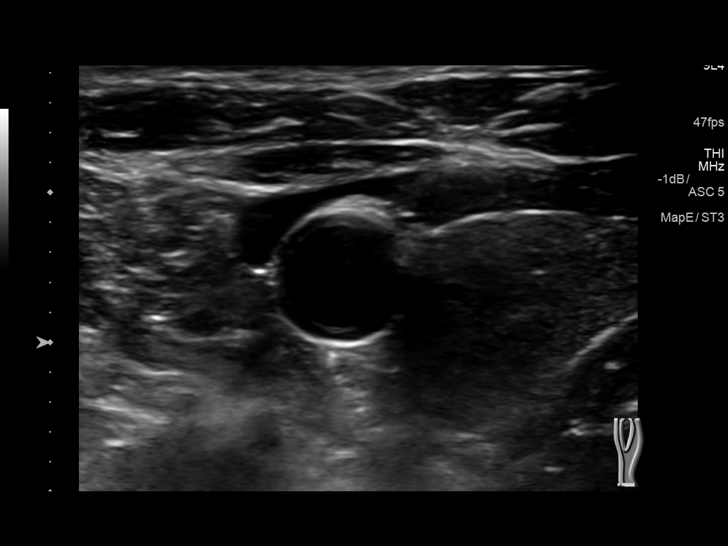
[im 7/81]
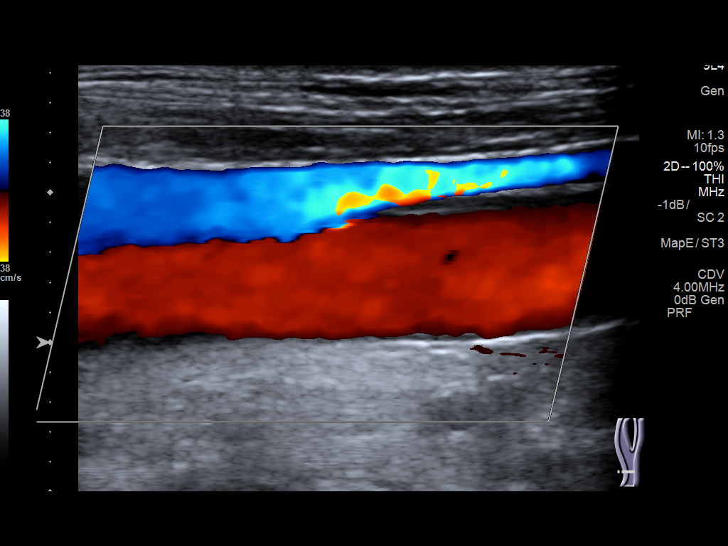
[im 14/81]
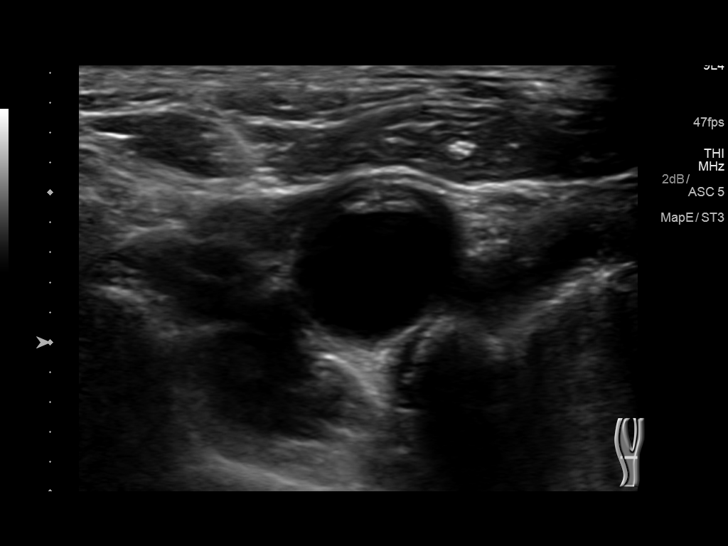
[im 21/81]
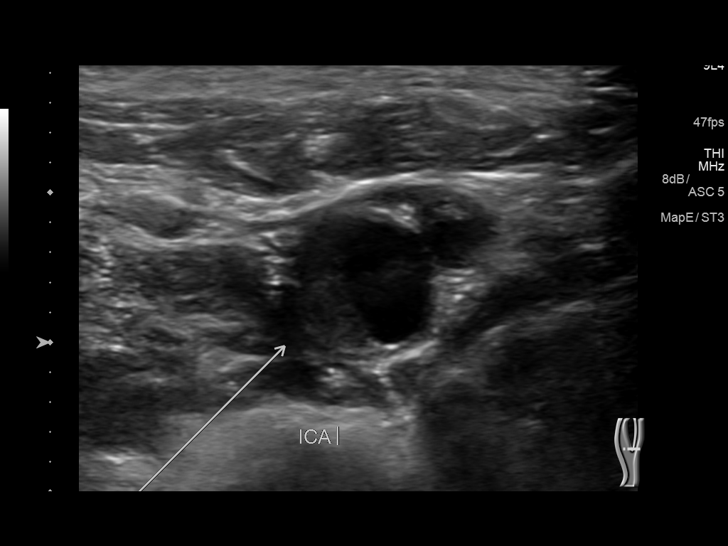
[im 28/81]
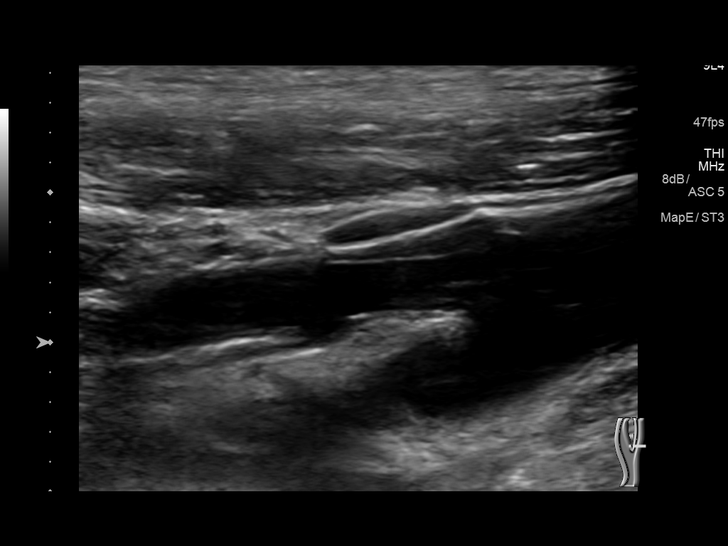
[im 35/81]
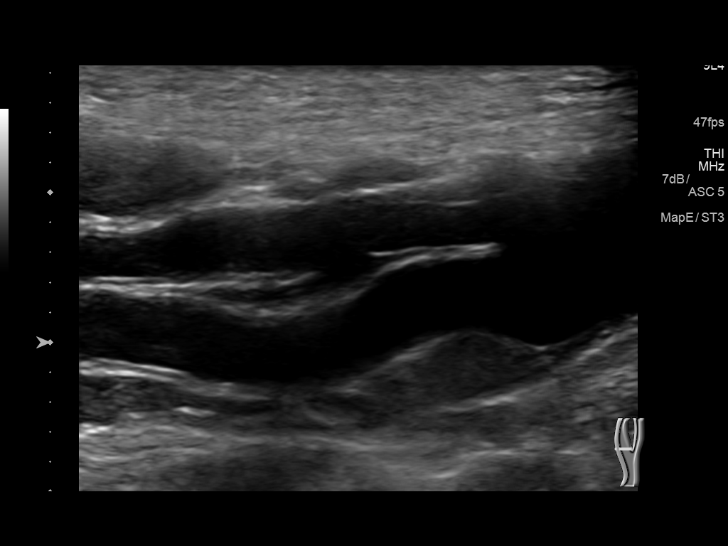
[im 42/81]
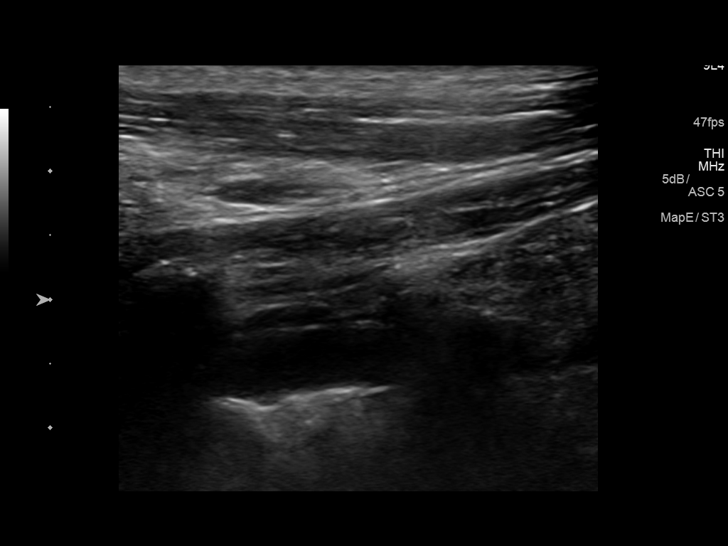
[im 46/81]
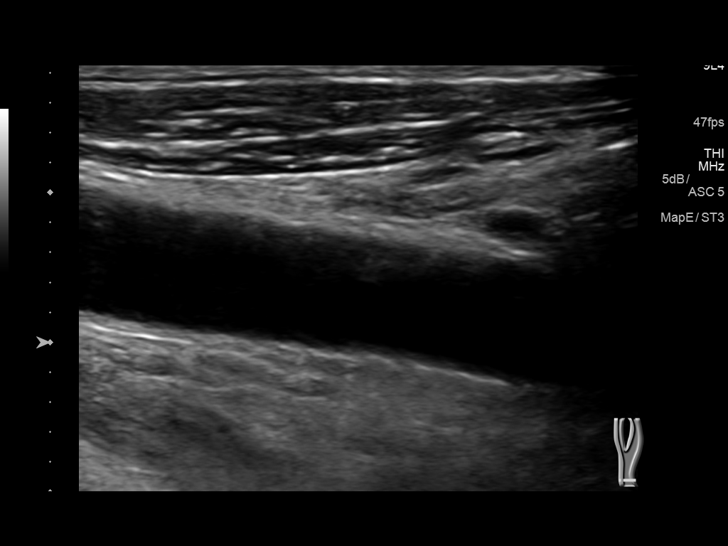
[im 53/81]
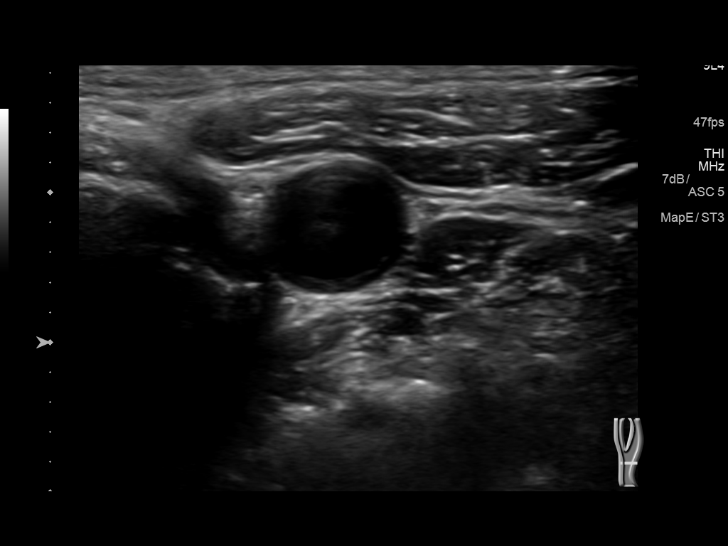
[im 60/81]
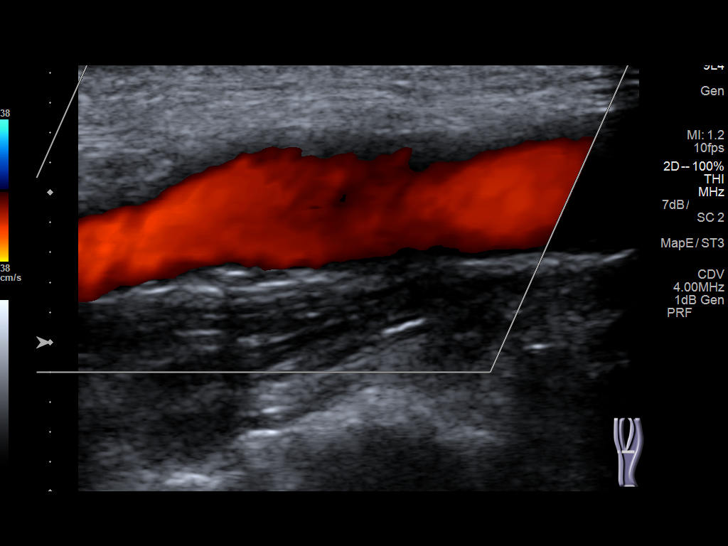
[im 67/81]
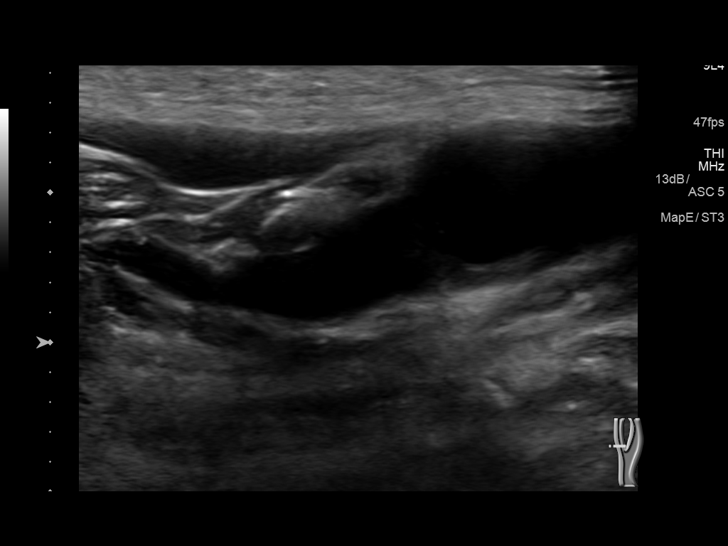
[im 74/81]
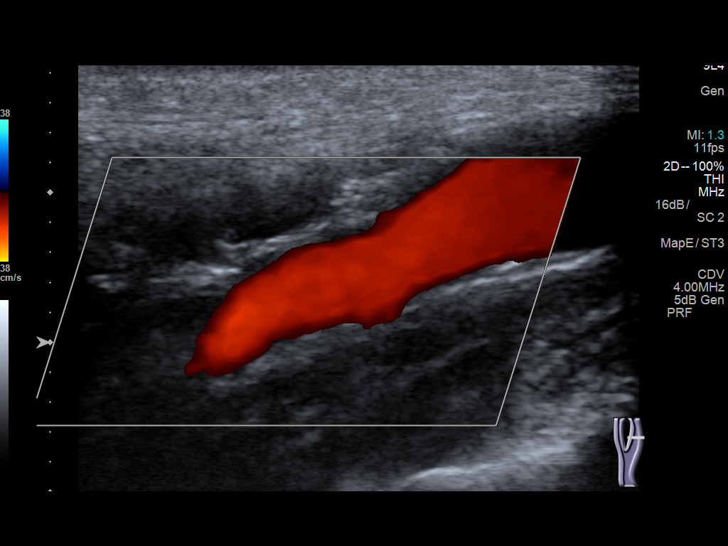
[im 81/81]
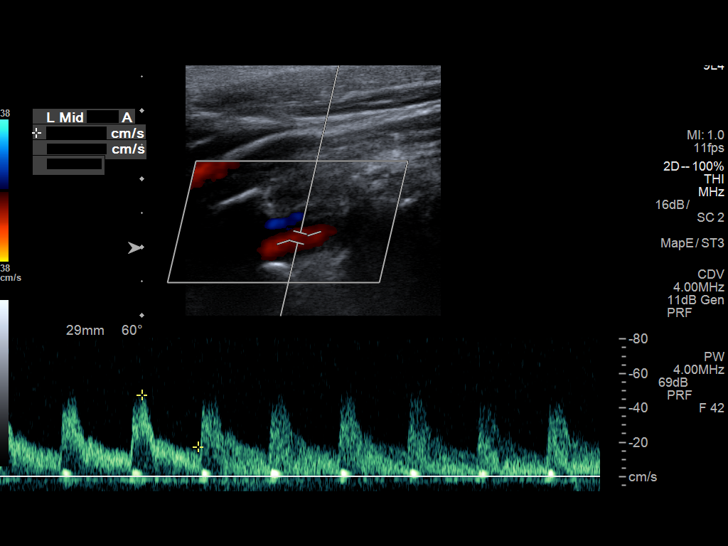

[13 of 24 positions shown; findings below may reference images not displayed]

FINDINGS: Criteria: Quantification of carotid stenosis is based on velocity
parameters that correlate the residual internal carotid diameter
with NASCET-based stenosis levels, using the diameter of the distal
internal carotid lumen as the denominator for stenosis measurement.

The following velocity measurements were obtained:

RIGHT

ICA: 109 cm/sec

CCA: 92 cm/sec

SYSTOLIC ICA/CCA RATIO:

ECA: 143 cm/sec

LEFT

ICA: 115 cm/sec

CCA: 88 cm/sec

SYSTOLIC ICA/CCA RATIO:

ECA: 146 cm/sec

RIGHT CAROTID ARTERY: Mild smooth mixed plaque in the upper common
carotid artery and bulb. Low resistance internal carotid Doppler
pattern is preserved.

RIGHT VERTEBRAL ARTERY:  Antegrade.

LEFT CAROTID ARTERY: Minimal mixed smooth plaque in the bulb. Mild
calcified plaque in the lower internal carotid. Low resistance
internal carotid Doppler pattern is preserved.

LEFT VERTEBRAL ARTERY:  Antegrade.

Upper extremity blood pressures: RIGHT:  LEFT:
IMPRESSION: Less than 50% stenosis in the right and left internal carotid
arteries.

## 2019-01-26 DIAGNOSIS — H353210 Exudative age-related macular degeneration, right eye, stage unspecified: Secondary | ICD-10-CM | POA: Diagnosis not present

## 2019-01-26 DIAGNOSIS — G894 Chronic pain syndrome: Secondary | ICD-10-CM | POA: Diagnosis not present

## 2019-01-26 DIAGNOSIS — I10 Essential (primary) hypertension: Secondary | ICD-10-CM | POA: Diagnosis not present

## 2019-01-26 DIAGNOSIS — M5440 Lumbago with sciatica, unspecified side: Secondary | ICD-10-CM | POA: Diagnosis not present

## 2019-01-26 DIAGNOSIS — K219 Gastro-esophageal reflux disease without esophagitis: Secondary | ICD-10-CM | POA: Diagnosis not present

## 2019-02-21 DIAGNOSIS — H34212 Partial retinal artery occlusion, left eye: Secondary | ICD-10-CM | POA: Diagnosis not present

## 2019-02-21 DIAGNOSIS — H35029 Exudative retinopathy, unspecified eye: Secondary | ICD-10-CM | POA: Diagnosis not present

## 2019-02-21 DIAGNOSIS — H3411 Central retinal artery occlusion, right eye: Secondary | ICD-10-CM | POA: Diagnosis not present

## 2019-02-21 DIAGNOSIS — H34831 Tributary (branch) retinal vein occlusion, right eye, with macular edema: Secondary | ICD-10-CM | POA: Diagnosis not present

## 2019-04-18 ENCOUNTER — Encounter (INDEPENDENT_AMBULATORY_CARE_PROVIDER_SITE_OTHER): Payer: Medicare Other | Admitting: Ophthalmology

## 2019-04-26 ENCOUNTER — Encounter (INDEPENDENT_AMBULATORY_CARE_PROVIDER_SITE_OTHER): Payer: Medicare PPO | Admitting: Ophthalmology

## 2019-04-29 DIAGNOSIS — D696 Thrombocytopenia, unspecified: Secondary | ICD-10-CM | POA: Diagnosis not present

## 2019-04-29 DIAGNOSIS — E782 Mixed hyperlipidemia: Secondary | ICD-10-CM | POA: Diagnosis not present

## 2019-04-29 DIAGNOSIS — Z0000 Encounter for general adult medical examination without abnormal findings: Secondary | ICD-10-CM | POA: Diagnosis not present

## 2019-04-29 DIAGNOSIS — H6120 Impacted cerumen, unspecified ear: Secondary | ICD-10-CM | POA: Diagnosis not present

## 2019-04-29 DIAGNOSIS — E785 Hyperlipidemia, unspecified: Secondary | ICD-10-CM | POA: Diagnosis not present

## 2019-04-29 DIAGNOSIS — E875 Hyperkalemia: Secondary | ICD-10-CM | POA: Diagnosis not present

## 2019-04-29 DIAGNOSIS — G894 Chronic pain syndrome: Secondary | ICD-10-CM | POA: Diagnosis not present

## 2019-04-29 DIAGNOSIS — H353210 Exudative age-related macular degeneration, right eye, stage unspecified: Secondary | ICD-10-CM | POA: Diagnosis not present

## 2019-04-29 DIAGNOSIS — H47019 Ischemic optic neuropathy, unspecified eye: Secondary | ICD-10-CM | POA: Diagnosis not present

## 2019-05-03 ENCOUNTER — Encounter (INDEPENDENT_AMBULATORY_CARE_PROVIDER_SITE_OTHER): Payer: Medicare PPO | Admitting: Ophthalmology

## 2019-05-04 DIAGNOSIS — Z8673 Personal history of transient ischemic attack (TIA), and cerebral infarction without residual deficits: Secondary | ICD-10-CM | POA: Diagnosis not present

## 2019-05-04 DIAGNOSIS — D696 Thrombocytopenia, unspecified: Secondary | ICD-10-CM | POA: Diagnosis not present

## 2019-05-04 DIAGNOSIS — M5440 Lumbago with sciatica, unspecified side: Secondary | ICD-10-CM | POA: Diagnosis not present

## 2019-05-04 DIAGNOSIS — E782 Mixed hyperlipidemia: Secondary | ICD-10-CM | POA: Diagnosis not present

## 2019-05-04 DIAGNOSIS — E875 Hyperkalemia: Secondary | ICD-10-CM | POA: Diagnosis not present

## 2019-05-04 DIAGNOSIS — Z0001 Encounter for general adult medical examination with abnormal findings: Secondary | ICD-10-CM | POA: Diagnosis not present

## 2019-05-04 DIAGNOSIS — G894 Chronic pain syndrome: Secondary | ICD-10-CM | POA: Diagnosis not present

## 2019-05-04 DIAGNOSIS — H353210 Exudative age-related macular degeneration, right eye, stage unspecified: Secondary | ICD-10-CM | POA: Diagnosis not present

## 2019-05-04 DIAGNOSIS — R7301 Impaired fasting glucose: Secondary | ICD-10-CM | POA: Diagnosis not present

## 2019-05-04 DIAGNOSIS — I10 Essential (primary) hypertension: Secondary | ICD-10-CM | POA: Diagnosis not present

## 2019-05-05 ENCOUNTER — Other Ambulatory Visit: Payer: Self-pay

## 2019-05-05 ENCOUNTER — Ambulatory Visit (INDEPENDENT_AMBULATORY_CARE_PROVIDER_SITE_OTHER): Payer: Medicare PPO | Admitting: Ophthalmology

## 2019-05-05 ENCOUNTER — Encounter (INDEPENDENT_AMBULATORY_CARE_PROVIDER_SITE_OTHER): Payer: Self-pay | Admitting: Ophthalmology

## 2019-05-05 DIAGNOSIS — H35029 Exudative retinopathy, unspecified eye: Secondary | ICD-10-CM | POA: Insufficient documentation

## 2019-05-05 DIAGNOSIS — H2511 Age-related nuclear cataract, right eye: Secondary | ICD-10-CM | POA: Diagnosis not present

## 2019-05-05 DIAGNOSIS — H2512 Age-related nuclear cataract, left eye: Secondary | ICD-10-CM | POA: Diagnosis not present

## 2019-05-05 DIAGNOSIS — H3411 Central retinal artery occlusion, right eye: Secondary | ICD-10-CM

## 2019-05-05 DIAGNOSIS — H34212 Partial retinal artery occlusion, left eye: Secondary | ICD-10-CM | POA: Diagnosis not present

## 2019-05-05 DIAGNOSIS — H34831 Tributary (branch) retinal vein occlusion, right eye, with macular edema: Secondary | ICD-10-CM | POA: Diagnosis not present

## 2019-05-05 MED ORDER — BEVACIZUMAB CHEMO INJECTION 1.25MG/0.05ML SYRINGE FOR KALEIDOSCOPE
1.2500 mg | INTRAVITREAL | Status: AC | PRN
  Administered 2019-05-05: 16:00:00 1.25 mg via INTRAVITREAL

## 2019-05-05 NOTE — Progress Notes (Signed)
05/05/2019     CHIEF COMPLAINT Patient presents for Retina Follow Up   HISTORY OF PRESENT ILLNESS: Dylan Farley is a 64 y.o. male who presents to the clinic today for:   HPI    Retina Follow Up    Patient presents with  CRVO/BRVO.  In right eye.  Duration of 10 weeks.  Since onset it is stable.          Comments    10 week follow up - OCT OU, Possible Avastin OD Patient denies change in vision and overall has no complaints.        Last edited by Gerda Diss on 05/05/2019  2:40 PM. (History)      Referring physician: Celene Squibb, MD [place],  [place] [address]  HISTORICAL INFORMATION:   Selected notes from the MEDICAL RECORD NUMBER    Lab Results  Component Value Date   HGBA1C 5.3 08/31/2017     CURRENT MEDICATIONS: No current outpatient medications on file. (Ophthalmic Drugs)   No current facility-administered medications for this visit. (Ophthalmic Drugs)   Current Outpatient Medications (Other)  Medication Sig  . atorvastatin (LIPITOR) 40 MG tablet Take 1 tablet (40 mg total) by mouth daily at 6 PM.  . clopidogrel (PLAVIX) 75 MG tablet Take 1 tablet (75 mg total) by mouth daily.  Marland Kitchen losartan (COZAAR) 50 MG tablet Take 50 mg by mouth daily.  Marland Kitchen omeprazole (PRILOSEC) 20 MG capsule Take 1 capsule (20 mg total) by mouth daily.  . pregabalin (LYRICA) 150 MG capsule Take 1 capsule (150 mg total) by mouth 3 (three) times daily.  . Tapentadol HCl (NUCYNTA ER) 250 MG TB12 Take 250 mg by mouth 2 (two) times daily.  . Tapentadol HCl (NUCYNTA) 100 MG TABS Take 100 mg by mouth 4 (four) times daily.   No current facility-administered medications for this visit. (Other)      REVIEW OF SYSTEMS:    ALLERGIES Allergies  Allergen Reactions  . Morphine And Related Nausea Only    PAST MEDICAL HISTORY Past Medical History:  Diagnosis Date  . Depressive disorder, not elsewhere classified   . Facet syndrome, lumbar   . Hypertension   . Lesion  of sciatic nerve   . Lumbosacral spondylosis without myelopathy   . Other chronic postoperative pain   . Sciatic neuritis   . Spinal stenosis, lumbar region, without neurogenic claudication    Past Surgical History:  Procedure Laterality Date  . KNEE SURGERY      FAMILY HISTORY Family History  Problem Relation Age of Onset  . Arthritis Mother   . Diabetes Brother     SOCIAL HISTORY Social History   Tobacco Use  . Smoking status: Current Every Day Smoker    Packs/day: 1.00    Years: 45.00    Pack years: 45.00    Types: Cigarettes  . Smokeless tobacco: Never Used  Substance Use Topics  . Alcohol use: No  . Drug use: No         OPHTHALMIC EXAM:  Base Eye Exam    Visual Acuity (Snellen - Linear)      Right Left   Dist [place] 20/400 20/30-1   Dist ph [place] 20/200 20/25-1       Tonometry (Tonopen, 2:47 PM)      Right Left   Pressure 15 15       Pupils      Pupils Dark Light Shape React APD   Right  PERRL 4 3 Round Slow None   Left PERRL 4 3 Round Slow None       Visual Fields (Counting fingers)      Left Right    Full Full       Extraocular Movement      Right Left    Full Full       Neuro/Psych    Oriented x3: Yes   Mood/Affect: Normal       Dilation    Right eye: 1.0% Mydriacyl, 2.5% Phenylephrine @ 2:47 PM        Slit Lamp and Fundus Exam    External Exam      Right Left   External Normal Normal       Slit Lamp Exam      Right Left   Lids/Lashes Normal Normal   Conjunctiva/Sclera White and quiet White and quiet   Cornea Clear Clear   Anterior Chamber Deep and quiet Deep and quiet   Iris Round and reactive Round and reactive   Anterior Vitreous Normal Normal       Fundus Exam      Right Left   Posterior Vitreous Normal Normal   Disc Normal Normal   C/D Ratio 0.3 0.3   Macula Microaneurysms, Cystoid macular edema Normal   Vessels   Retinal vein occlusion inferotemporal to the fovea, Normal   Periphery Normal Normal           IMAGING AND PROCEDURES  Imaging and Procedures for 05/05/19  OCT, Retina - OU - Both Eyes       Right Eye Quality was good. Scan locations included subfoveal. Central Foveal Thickness: 291. Progression has been stable. Findings include cystoid macular edema.   Left Eye Quality was good. Scan locations included subfoveal. Progression has no prior data.   Notes OD stable over this interval of 10 weeks CME inferotemporal to the fovea.  May 1 day need focal laser photocoagulation to this residual area of edema        Intravitreal Injection, Pharmacologic Agent - OD - Right Eye       Time Out 05/05/2019. 3:38 PM. Confirmed correct patient, procedure, site, and patient consented.   Anesthesia Topical anesthesia was used. Anesthetic medications included Ak[place] 3.5%.   Procedure Preparation included Ofloxacin , Tobramycin 0.3%, 10% betadine to eyelids.   Injection:  1.25 mg Bevacizumab (AVASTIN) SOLN   NDC: [id number], Lot: [id number]   Route: Intravitreal, Site: Right Eye, Waste: 0 mg  Post-op Post injection exam found visual acuity of at least counting fingers. There were no complications. Post injection medications were not given.                 ASSESSMENT/PLAN:  No problem-specific Assessment & Plan notes found for this encounter.      ICD-10-CM   1. Branch retinal vein occlusion with macular edema of right eye  H34.8310 OCT, Retina - OU - Both Eyes    Intravitreal Injection, Pharmacologic Agent - OD - Right Eye    Bevacizumab (AVASTIN) SOLN 1.25 mg  2. 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 Nuclear sclerotic cataract of right eye  H25.11   5. Nuclear sclerotic cataract of left eye  H25.12     1.  Avastin today.  10-week interval resulted in only stability of the CME from macula BRVO.  Thus we will shorten the exam interval to 8  weeks next  2.  3.  Ophthalmic Meds Ordered this  visit:  Meds ordered this encounter  Medications  . Bevacizumab (AVASTIN) SOLN 1.25 mg       No follow-ups on file.  Patient Instructions  The nature of branch retinal vein occlusion with macular edema was discussed.  The patient was given access to printed information.  The treatment options including continued observation looking for spontaneous resolution versus grid laser versus intravitreal Kenalog injection were discussed.  PRIMARY THERAPY CONSISTS of Anti-VEGF Therapies, AVASTIN, LUCENTIS AND EYLEA.  Their usage was discussed to assist in halting the progression of Macular Edema, in order to preserve, protect or improve acuity.  Additionally, at times, limited focal laser therapy is used in the management.  The risks and benefits of all these options were discussed with the patient.  The patient's questions were answered.    Explained the diagnoses, plan, and follow up with the patient and they expressed understanding.  Patient expressed understanding of the importance of proper follow up care.   Clent Demark Cayleigh Paull M.D. Diseases & Surgery of the Retina and Vitreous Retina [facility] Diabetic [place] 05/05/19     Abbreviations: M myopia (nearsighted); A astigmatism; H hyperopia (farsighted); P presbyopia; Mrx spectacle prescription;  CTL contact lenses; OD right eye; OS left eye; OU both eyes  XT exotropia; ET esotropia; PEK punctate epithelial keratitis; PEE punctate epithelial erosions; DES dry eye syndrome; MGD meibomian gland dysfunction; ATs artificial tears; PFAT's preservative free artificial tears; [place] nuclear sclerotic cataract; PSC posterior subcapsular cataract; ERM epi-retinal membrane; PVD posterior vitreous detachment; RD retinal detachment; DM diabetes mellitus; DR diabetic retinopathy; NPDR non-proliferative diabetic retinopathy; PDR proliferative diabetic retinopathy; CSME clinically significant macular edema; DME diabetic macular edema; dbh dot blot hemorrhages; CWS cotton  wool spot; POAG primary open angle glaucoma; C/D cup-to-disc ratio; HVF humphrey visual field; GVF goldmann visual field; OCT optical coherence tomography; IOP intraocular pressure; BRVO Branch retinal vein occlusion; CRVO central retinal vein occlusion; CRAO central retinal artery occlusion; BRAO branch retinal artery occlusion; RT retinal tear; SB scleral buckle; PPV pars plana vitrectomy; VH Vitreous hemorrhage; PRP panretinal laser photocoagulation; IVK intravitreal kenalog; VMT vitreomacular traction; MH Macular hole;  NVD neovascularization of the disc; NVE neovascularization elsewhere; AREDS age related eye disease study; ARMD age related macular degeneration; POAG primary open angle glaucoma; EBMD epithelial/anterior basement membrane dystrophy; ACIOL anterior chamber intraocular lens; IOL intraocular lens; PCIOL posterior chamber intraocular lens; Phaco/IOL phacoemulsification with intraocular lens placement; [place] photorefractive keratectomy; LASIK laser assisted in situ keratomileusis; HTN hypertension; DM diabetes mellitus; COPD chronic obstructive pulmonary disease

## 2019-05-05 NOTE — Patient Instructions (Signed)
The nature of branch retinal vein occlusion with macular edema was discussed.  The patient was given access to printed information.  The treatment options including continued observation looking for spontaneous resolution versus grid laser versus intravitreal Kenalog injection were discussed.  PRIMARY THERAPY CONSISTS of Anti-VEGF Therapies, AVASTIN, LUCENTIS AND EYLEA.  Their usage was discussed to assist in halting the progression of Macular Edema, in order to preserve, protect or improve acuity.  Additionally, at times, limited focal laser therapy is used in the management.  The risks and benefits of all these options were discussed with the patient.  The patient's questions were answered. 

## 2019-06-30 ENCOUNTER — Other Ambulatory Visit: Payer: Self-pay

## 2019-06-30 ENCOUNTER — Ambulatory Visit (INDEPENDENT_AMBULATORY_CARE_PROVIDER_SITE_OTHER): Payer: Medicare PPO | Admitting: Ophthalmology

## 2019-06-30 ENCOUNTER — Encounter (INDEPENDENT_AMBULATORY_CARE_PROVIDER_SITE_OTHER): Payer: Self-pay | Admitting: Ophthalmology

## 2019-06-30 DIAGNOSIS — H34831 Tributary (branch) retinal vein occlusion, right eye, with macular edema: Secondary | ICD-10-CM | POA: Diagnosis not present

## 2019-06-30 MED ORDER — BEVACIZUMAB CHEMO INJECTION 1.25MG/0.05ML SYRINGE FOR KALEIDOSCOPE
1.2500 mg | INTRAVITREAL | Status: AC | PRN
  Administered 2019-06-30: 1.25 mg via INTRAVITREAL

## 2019-06-30 NOTE — Progress Notes (Signed)
06/30/2019     CHIEF COMPLAINT Patient presents for Retina Follow Up   HISTORY OF PRESENT ILLNESS: Dylan Farley is a 64 y.o. male who presents to the clinic today for:   HPI    Retina Follow Up    Patient presents with  CRVO/BRVO.  In right eye.  Duration of 8 weeks.  Since onset it is gradually improving.          Comments    8 week follow up- OCT OU, Poss Avastin OD Patient denies change in vision and overall has no complaints.        Last edited by Hurman Horn, MD on 06/30/2019  2:35 PM. (History)      Referring physician: Celene Squibb, MD [place],   [id number]  HISTORICAL INFORMATION:   Selected notes from the MEDICAL RECORD NUMBER    Lab Results  Component Value Date   HGBA1C 5.3 08/31/2017     CURRENT MEDICATIONS: No current outpatient medications on file. (Ophthalmic Drugs)   No current facility-administered medications for this visit. (Ophthalmic Drugs)   Current Outpatient Medications (Other)  Medication Sig  . atorvastatin (LIPITOR) 40 MG tablet Take 1 tablet (40 mg total) by mouth daily at 6 PM.  . clopidogrel (PLAVIX) 75 MG tablet Take 1 tablet (75 mg total) by mouth daily.  Marland Kitchen losartan (COZAAR) 50 MG tablet Take 50 mg by mouth daily.  Marland Kitchen omeprazole (PRILOSEC) 20 MG capsule Take 1 capsule (20 mg total) by mouth daily.  . pregabalin (LYRICA) 150 MG capsule Take 1 capsule (150 mg total) by mouth 3 (three) times daily.  . Tapentadol HCl (NUCYNTA ER) 250 MG TB12 Take 250 mg by mouth 2 (two) times daily.  . Tapentadol HCl (NUCYNTA) 100 MG TABS Take 100 mg by mouth 4 (four) times daily.   No current facility-administered medications for this visit. (Other)      REVIEW OF SYSTEMS:    ALLERGIES Allergies  Allergen Reactions  . Morphine And Related Nausea Only    PAST MEDICAL HISTORY Past Medical History:  Diagnosis Date  . Depressive disorder, not elsewhere classified   . Facet syndrome, lumbar   . Hypertension     . Lesion of sciatic nerve   . Lumbosacral spondylosis without myelopathy   . Other chronic postoperative pain   . Sciatic neuritis   . Spinal stenosis, lumbar region, without neurogenic claudication    Past Surgical History:  Procedure Laterality Date  . KNEE SURGERY      FAMILY HISTORY Family History  Problem Relation Age of Onset  . Arthritis Mother   . Diabetes Brother     SOCIAL HISTORY Social History   Tobacco Use  . Smoking status: Current Every Day Smoker    Packs/day: 1.00    Years: 45.00    Pack years: 45.00    Types: Cigarettes  . Smokeless tobacco: Never Used  Substance Use Topics  . Alcohol use: No  . Drug use: No         OPHTHALMIC EXAM:  Base Eye Exam    Visual Acuity (Snellen - Linear)      Right Left   Dist [place] 20/400 20/40+1   Dist ph [place] 20/200 20/30+2       Tonometry (Tonopen, 2:17 PM)      Right Left   Pressure 14 10       Pupils      Pupils Dark Light Shape React APD  Right PERRL 4 3 Round Slow None   Left PERRL 4 3 Round Slow None       Visual Fields (Counting fingers)      Left Right    Full Full       Extraocular Movement      Right Left    Full Full       Neuro/Psych    Oriented x3: Yes   Mood/Affect: Normal       Dilation    Right eye: 1.0% Mydriacyl, 2.5% Phenylephrine @ 2:16 PM        Slit Lamp and Fundus Exam    External Exam      Right Left   External Normal Normal       Slit Lamp Exam      Right Left   Lids/Lashes Normal Normal   Conjunctiva/Sclera White and quiet White and quiet   Cornea Clear Clear   Anterior Chamber Deep and quiet Deep and quiet   Iris Round and reactive Round and reactive   Lens Posterior chamber intraocular lens Posterior chamber intraocular lens   Anterior Vitreous Normal Normal       Fundus Exam      Right Left   Posterior Vitreous Normal    Disc Normal    C/D Ratio 0.3    Macula Microaneurysms,  Cystoid macular edema    Vessels   Retinal vein occlusion  inferotemporal to the fovea,    Periphery Normal           IMAGING AND PROCEDURES  Imaging and Procedures for 06/30/19  OCT, Retina - OU - Both Eyes       Right Eye Quality was good. Scan locations included subfoveal. Central Foveal Thickness: 249. Progression has improved. Findings include cystoid macular edema.   Left Eye Quality was good. Scan locations included subfoveal. Central Foveal Thickness: 285. Progression has been stable. Findings include retinal drusen .   Notes OD with less CME improved with 8 weeks of intravitreal Avastin                ASSESSMENT/PLAN:  Branch retinal vein occlusion with macular edema of right eye At 8-week exam interval OD today, CME has improved centrally as well as peripherally in the macula.  We will repeat intravitreal Avastin OD today and examination interval the same      ICD-10-CM   1. Branch retinal vein occlusion with macular edema of right eye  H34.8310 OCT, Retina - OU - Both Eyes    1.  Repeat intravitreal Avastin OD today, repeat examination in 8 weeks  2.  3.  Ophthalmic Meds Ordered this visit:  No orders of the defined types were placed in this encounter.      Return in about 8 weeks (around 08/25/2019) for dilate, OD, AVASTIN OCT.  There are no Patient Instructions on file for this visit.   Explained the diagnoses, plan, and follow up with the patient and they expressed understanding.  Patient expressed understanding of the importance of proper follow up care.   Clent Demark Toccara Alford M.D. Diseases & Surgery of the Retina and Vitreous Retina & Diabetic [place] 06/30/19     Abbreviations: M myopia (nearsighted); A astigmatism; H hyperopia (farsighted); P presbyopia; Mrx spectacle prescription;  CTL contact lenses; OD right eye; OS left eye; OU both eyes  XT exotropia; ET esotropia; PEK punctate epithelial keratitis; PEE punctate epithelial erosions; DES dry eye syndrome; MGD meibomian gland dysfunction;  ATs artificial tears; PFAT's  preservative free artificial tears; [place] nuclear sclerotic cataract; PSC posterior subcapsular cataract; ERM epi-retinal membrane; PVD posterior vitreous detachment; RD retinal detachment; DM diabetes mellitus; DR diabetic retinopathy; NPDR non-proliferative diabetic retinopathy; PDR proliferative diabetic retinopathy; CSME clinically significant macular edema; DME diabetic macular edema; dbh dot blot hemorrhages; CWS cotton wool spot; POAG primary open angle glaucoma; C/D cup-to-disc ratio; HVF humphrey visual field; GVF goldmann visual field; OCT optical coherence tomography; IOP intraocular pressure; BRVO Branch retinal vein occlusion; CRVO central retinal vein occlusion; CRAO central retinal artery occlusion; BRAO branch retinal artery occlusion; RT retinal tear; SB scleral buckle; PPV pars plana vitrectomy; VH Vitreous hemorrhage; PRP panretinal laser photocoagulation; IVK intravitreal kenalog; VMT vitreomacular traction; MH Macular hole;  NVD neovascularization of the disc; NVE neovascularization elsewhere; AREDS age related eye disease study; ARMD age related macular degeneration; POAG primary open angle glaucoma; EBMD epithelial/anterior basement membrane dystrophy; ACIOL anterior chamber intraocular lens; IOL intraocular lens; PCIOL posterior chamber intraocular lens; Phaco/IOL phacoemulsification with intraocular lens placement; [place] photorefractive keratectomy; LASIK laser assisted in situ keratomileusis; HTN hypertension; DM diabetes mellitus; COPD chronic obstructive pulmonary disease

## 2019-06-30 NOTE — Assessment & Plan Note (Signed)
At 8-week exam interval OD today, CME has improved centrally as well as peripherally in the macula.  We will repeat intravitreal Avastin OD today and examination interval the same

## 2019-08-04 DIAGNOSIS — H353210 Exudative age-related macular degeneration, right eye, stage unspecified: Secondary | ICD-10-CM | POA: Diagnosis not present

## 2019-08-04 DIAGNOSIS — G894 Chronic pain syndrome: Secondary | ICD-10-CM | POA: Diagnosis not present

## 2019-08-04 DIAGNOSIS — M5440 Lumbago with sciatica, unspecified side: Secondary | ICD-10-CM | POA: Diagnosis not present

## 2019-08-04 DIAGNOSIS — I10 Essential (primary) hypertension: Secondary | ICD-10-CM | POA: Diagnosis not present

## 2019-08-25 ENCOUNTER — Other Ambulatory Visit: Payer: Self-pay

## 2019-08-25 ENCOUNTER — Encounter (INDEPENDENT_AMBULATORY_CARE_PROVIDER_SITE_OTHER): Payer: Self-pay | Admitting: Ophthalmology

## 2019-08-25 ENCOUNTER — Ambulatory Visit (INDEPENDENT_AMBULATORY_CARE_PROVIDER_SITE_OTHER): Payer: Medicare PPO | Admitting: Ophthalmology

## 2019-08-25 DIAGNOSIS — H34831 Tributary (branch) retinal vein occlusion, right eye, with macular edema: Secondary | ICD-10-CM | POA: Diagnosis not present

## 2019-08-25 DIAGNOSIS — H2511 Age-related nuclear cataract, right eye: Secondary | ICD-10-CM | POA: Diagnosis not present

## 2019-08-25 MED ORDER — BEVACIZUMAB CHEMO INJECTION 1.25MG/0.05ML SYRINGE FOR KALEIDOSCOPE
1.2500 mg | INTRAVITREAL | Status: AC | PRN
  Administered 2019-08-25: 1.25 mg via INTRAVITREAL

## 2019-08-25 NOTE — Assessment & Plan Note (Signed)

## 2019-08-25 NOTE — Patient Instructions (Signed)
Patient instructed to contact the office immediately for new visual acuity changes or decline

## 2019-08-25 NOTE — Assessment & Plan Note (Signed)
CME secondary to BRVO continues to improve OD

## 2019-08-25 NOTE — Progress Notes (Signed)
08/25/2019     CHIEF COMPLAINT Patient presents for Retina Follow Up   HISTORY OF PRESENT ILLNESS: Dylan Farley is a 63 y.o. male who presents to the clinic today for:   HPI    Retina Follow Up    Patient presents with  CRVO/BRVO.  In right eye.  This started 8 weeks ago.  Severity is mild.  Duration of 8 weeks.  Since onset it is stable.          Comments    8 Week BRVO F/U OD, poss Avastin OD  Pt denies noticeable changes to [place] OU since last visit. Pt denies ocular pain, flashes of light, or floaters OU.         Last edited by Rockie Neighbours, [place] on 08/25/2019  2:21 PM. (History)      Referring physician: Celene Squibb, MD [place],  [place] [id number]  HISTORICAL INFORMATION:   Selected notes from the MEDICAL RECORD NUMBER    Lab Results  Component Value Date   HGBA1C 5.3 08/31/2017     CURRENT MEDICATIONS: No current outpatient medications on file. (Ophthalmic Drugs)   No current facility-administered medications for this visit. (Ophthalmic Drugs)   Current Outpatient Medications (Other)  Medication Sig  . atorvastatin (LIPITOR) 40 MG tablet Take 1 tablet (40 mg total) by mouth daily at 6 PM.  . clopidogrel (PLAVIX) 75 MG tablet Take 1 tablet (75 mg total) by mouth daily.  Marland Kitchen losartan (COZAAR) 50 MG tablet Take 50 mg by mouth daily.  Marland Kitchen omeprazole (PRILOSEC) 20 MG capsule Take 1 capsule (20 mg total) by mouth daily.  . pregabalin (LYRICA) 150 MG capsule Take 1 capsule (150 mg total) by mouth 3 (three) times daily.  . Tapentadol HCl (NUCYNTA ER) 250 MG TB12 Take 250 mg by mouth 2 (two) times daily.  . Tapentadol HCl (NUCYNTA) 100 MG TABS Take 100 mg by mouth 4 (four) times daily.   No current facility-administered medications for this visit. (Other)      REVIEW OF SYSTEMS:    ALLERGIES Allergies  Allergen Reactions  . Morphine And Related Nausea Only    PAST MEDICAL HISTORY Past Medical History:  Diagnosis Date  . Depressive  disorder, not elsewhere classified   . Facet syndrome, lumbar   . Hypertension   . Lesion of sciatic nerve   . Lumbosacral spondylosis without myelopathy   . Other chronic postoperative pain   . Sciatic neuritis   . Spinal stenosis, lumbar region, without neurogenic claudication    Past Surgical History:  Procedure Laterality Date  . KNEE SURGERY      FAMILY HISTORY Family History  Problem Relation Age of Onset  . Arthritis Mother   . Diabetes Brother     SOCIAL HISTORY Social History   Tobacco Use  . Smoking status: Current Every Day Smoker    Packs/day: 1.00    Years: 45.00    Pack years: 45.00    Types: Cigarettes  . Smokeless tobacco: Never Used  Substance Use Topics  . Alcohol use: No  . Drug use: No         OPHTHALMIC EXAM:  Base Eye Exam    Visual Acuity (ETDRS)      Right Left   Dist [place] 20/400ecc 20/40 -2   Dist ph [place] 20/200 20/20 -1       Tonometry (Tonopen, 2:24 PM)      Right Left   Pressure 14 12  Pupils      Pupils Dark Light Shape React APD   Right PERRL 4 3 Round Slow None   Left PERRL 4 3 Round Slow None       Visual Fields (Counting fingers)      Left Right    Full Full       Extraocular Movement      Right Left    Full Full       Neuro/Psych    Oriented x3: Yes   Mood/Affect: Normal       Dilation    Right eye: 1.0% Mydriacyl, 2.5% Phenylephrine @ 2:24 PM        Slit Lamp and Fundus Exam    External Exam      Right Left   External Normal Normal       Slit Lamp Exam      Right Left   Lids/Lashes Normal Normal   Conjunctiva/Sclera White and quiet White and quiet   Cornea Clear Clear   Anterior Chamber Deep and quiet Deep and quiet   Iris Round and reactive Round and reactive   Lens Posterior chamber intraocular lens Posterior chamber intraocular lens   Anterior Vitreous Normal Normal       Fundus Exam      Right Left   Posterior Vitreous Normal    Disc Normal    C/D Ratio 0.3    Macula  Microaneurysms,  Cystoid macular edema    Vessels   Retinal vein occlusion inferotemporal to the fovea,    Periphery Normal           IMAGING AND PROCEDURES  Imaging and Procedures for 08/25/19  OCT, Retina - OU - Both Eyes       Right Eye Scan locations included subfoveal. Central Foveal Thickness: 231. Progression has improved. Findings include cystoid macular edema.   Left Eye Quality was good. Scan locations included subfoveal. Central Foveal Thickness: 281. Progression has been stable. Findings include normal foveal contour.   Notes Much less active CME inferior to the fovea OD at 68-month interval, also less center involved foveal edema       Intravitreal Injection, Pharmacologic Agent - OD - Right Eye       Time Out 08/25/2019. 3:28 PM. Confirmed correct patient, procedure, site, and patient consented.   Anesthesia Topical anesthesia was used. Anesthetic medications included [place] 3.5%.   Procedure Preparation included Tobramycin 0.3%, Ofloxacin , 10% betadine to eyelids, 5% betadine to ocular surface. A 30 gauge needle was used.   Injection:  1.25 mg Bevacizumab (AVASTIN) SOLN   NDC: [address]-8, Lot: [id number]   Route: Intravitreal, Site: Right Eye, Waste: 0 mg  Post-op Post injection exam found visual acuity of at least counting fingers. The patient tolerated the procedure well. There were no complications. The patient received written and verbal post procedure care education. Post injection medications were not given.                 ASSESSMENT/PLAN:  Branch retinal vein occlusion with macular edema of right eye CME secondary to BRVO continues to improve OD  Nuclear sclerotic cataract of right eye The nature of cataract was discussed with the patient as well as the elective nature of surgery. The patient was reassured that surgery at a later date does not put the patient at risk for a worse outcome. It was emphasized that the need for surgery is  dictated by the patient's quality of life as influenced by the cataract.  Patient was instructed to maintain close follow up with their general eye care doctor.      ICD-10-CM   1. Branch retinal vein occlusion with macular edema of right eye  H34.8310 OCT, Retina - OU - Both Eyes    Intravitreal Injection, Pharmacologic Agent - OD - Right Eye    Bevacizumab (AVASTIN) SOLN 1.25 mg  2. Nuclear sclerotic cataract of right eye  H25.11     1.  Repeat intravitreal Avastin OD today, this condition is improved at the 8-week interval  2.  We will repeat follow-up examination in 9 weeks dilate the right eye and likely injection intravitreal Avastin at that time  3.  Ophthalmic Meds Ordered this visit:  Meds ordered this encounter  Medications  . Bevacizumab (AVASTIN) SOLN 1.25 mg       Return in about 9 weeks (around 10/27/2019) for dilate, OD, AVASTIN OCT.  Patient Instructions  Patient instructed to contact the office immediately for new visual acuity changes or decline    Explained the diagnoses, plan, and follow up with the patient and they expressed understanding.  Patient expressed understanding of the importance of proper follow up care.   Clent Demark Uma Jerde M.D. Diseases & Surgery of the Retina and Vitreous Retina & Diabetic [place] 08/25/19     Abbreviations: M myopia (nearsighted); A astigmatism; H hyperopia (farsighted); P presbyopia; Mrx spectacle prescription;  CTL contact lenses; OD right eye; OS left eye; OU both eyes  XT exotropia; ET esotropia; PEK punctate epithelial keratitis; PEE punctate epithelial erosions; DES dry eye syndrome; MGD meibomian gland dysfunction; ATs artificial tears; PFAT's preservative free artificial tears; [place] nuclear sclerotic cataract; PSC posterior subcapsular cataract; ERM epi-retinal membrane; PVD posterior vitreous detachment; RD retinal detachment; DM diabetes mellitus; DR diabetic retinopathy; NPDR non-proliferative diabetic retinopathy;  PDR proliferative diabetic retinopathy; CSME clinically significant macular edema; DME diabetic macular edema; dbh dot blot hemorrhages; CWS cotton wool spot; POAG primary open angle glaucoma; C/D cup-to-disc ratio; HVF humphrey visual field; GVF goldmann visual field; OCT optical coherence tomography; IOP intraocular pressure; BRVO Branch retinal vein occlusion; CRVO central retinal vein occlusion; CRAO central retinal artery occlusion; BRAO branch retinal artery occlusion; RT retinal tear; SB scleral buckle; PPV pars plana vitrectomy; VH Vitreous hemorrhage; PRP panretinal laser photocoagulation; IVK intravitreal kenalog; VMT vitreomacular traction; MH Macular hole;  NVD neovascularization of the disc; NVE neovascularization elsewhere; AREDS age related eye disease study; ARMD age related macular degeneration; POAG primary open angle glaucoma; EBMD epithelial/anterior basement membrane dystrophy; ACIOL anterior chamber intraocular lens; IOL intraocular lens; PCIOL posterior chamber intraocular lens; Phaco/IOL phacoemulsification with intraocular lens placement; [place] photorefractive keratectomy; LASIK laser assisted in situ keratomileusis; HTN hypertension; DM diabetes mellitus; COPD chronic obstructive pulmonary disease

## 2019-10-13 ENCOUNTER — Encounter (INDEPENDENT_AMBULATORY_CARE_PROVIDER_SITE_OTHER): Payer: Medicare PPO | Admitting: Ophthalmology

## 2019-10-27 ENCOUNTER — Encounter (INDEPENDENT_AMBULATORY_CARE_PROVIDER_SITE_OTHER): Payer: Medicare PPO | Admitting: Ophthalmology

## 2019-11-03 ENCOUNTER — Other Ambulatory Visit: Payer: Self-pay

## 2019-11-03 ENCOUNTER — Ambulatory Visit (INDEPENDENT_AMBULATORY_CARE_PROVIDER_SITE_OTHER): Payer: Medicare PPO | Admitting: Ophthalmology

## 2019-11-03 ENCOUNTER — Encounter (INDEPENDENT_AMBULATORY_CARE_PROVIDER_SITE_OTHER): Payer: Self-pay | Admitting: Ophthalmology

## 2019-11-03 DIAGNOSIS — R0981 Nasal congestion: Secondary | ICD-10-CM | POA: Diagnosis not present

## 2019-11-03 DIAGNOSIS — H34831 Tributary (branch) retinal vein occlusion, right eye, with macular edema: Secondary | ICD-10-CM

## 2019-11-03 DIAGNOSIS — D3131 Benign neoplasm of right choroid: Secondary | ICD-10-CM | POA: Diagnosis not present

## 2019-11-03 DIAGNOSIS — Z8673 Personal history of transient ischemic attack (TIA), and cerebral infarction without residual deficits: Secondary | ICD-10-CM | POA: Diagnosis not present

## 2019-11-03 DIAGNOSIS — Z09 Encounter for follow-up examination after completed treatment for conditions other than malignant neoplasm: Secondary | ICD-10-CM | POA: Diagnosis not present

## 2019-11-03 DIAGNOSIS — I499 Cardiac arrhythmia, unspecified: Secondary | ICD-10-CM | POA: Diagnosis not present

## 2019-11-03 DIAGNOSIS — Z0001 Encounter for general adult medical examination with abnormal findings: Secondary | ICD-10-CM | POA: Diagnosis not present

## 2019-11-03 DIAGNOSIS — M5440 Lumbago with sciatica, unspecified side: Secondary | ICD-10-CM | POA: Diagnosis not present

## 2019-11-03 DIAGNOSIS — Z712 Person consulting for explanation of examination or test findings: Secondary | ICD-10-CM | POA: Diagnosis not present

## 2019-11-03 DIAGNOSIS — K219 Gastro-esophageal reflux disease without esophagitis: Secondary | ICD-10-CM | POA: Diagnosis not present

## 2019-11-03 DIAGNOSIS — Z0000 Encounter for general adult medical examination without abnormal findings: Secondary | ICD-10-CM | POA: Diagnosis not present

## 2019-11-03 MED ORDER — BEVACIZUMAB CHEMO INJECTION 1.25MG/0.05ML SYRINGE FOR KALEIDOSCOPE
1.2500 mg | INTRAVITREAL | Status: AC | PRN
  Administered 2019-11-03: 1.25 mg via INTRAVITREAL

## 2019-11-03 NOTE — Assessment & Plan Note (Signed)
The nature of branch retinal vein occlusion with macular edema was discussed.  The patient was given access to printed information.  The treatment options including continued observation looking for spontaneous resolution versus grid laser versus intravitreal Kenalog injection were discussed.  PRIMARY THERAPY CONSISTS of Anti-VEGF Therapies, AVASTIN, LUCENTIS AND EYLEA.  Their usage was discussed to assist in halting the progression of Macular Edema, in order to preserve, protect or improve acuity.  Additionally, at times, limited focal laser therapy is used in the management.  The risks and benefits of all these options were discussed with the patient.  The patient's questions were answered.  CME overall and subfoveal atrophy centrally accounts for residual acuity issues.  That there is no relationship with a small choroidal nevus posteriorly

## 2019-11-03 NOTE — Assessment & Plan Note (Signed)
All flat, 1.5 disc diameter size in the posterior pole, macula, no high risk features

## 2019-11-03 NOTE — Progress Notes (Signed)
11/03/2019     CHIEF COMPLAINT Patient presents for Retina Follow Up   HISTORY OF PRESENT ILLNESS: Dylan Farley is a 64 y.o. male who presents to the clinic today for:   HPI    Retina Follow Up    Patient presents with  CRVO/BRVO.  In right eye.  This started 10 weeks ago.  Severity is mild.  Duration of 10 weeks.  Since onset it is stable.          Comments    10 Week BRVO F/U OD, poss Avastin OD  Pt denies noticeable changes to [place] OU since last visit. Pt denies ocular pain, flashes of light, or floaters OU.         Last edited by Rockie Neighbours, [place] on 11/03/2019  1:07 PM. (History)      Referring physician: Celene Squibb, MD [place],  [place] [address]  HISTORICAL INFORMATION:   Selected notes from the MEDICAL RECORD NUMBER    Lab Results  Component Value Date   HGBA1C 5.3 08/31/2017     CURRENT MEDICATIONS: No current outpatient medications on file. (Ophthalmic Drugs)   No current facility-administered medications for this visit. (Ophthalmic Drugs)   Current Outpatient Medications (Other)  Medication Sig  . atorvastatin (LIPITOR) 40 MG tablet Take 1 tablet (40 mg total) by mouth daily at 6 PM.  . clopidogrel (PLAVIX) 75 MG tablet Take 1 tablet (75 mg total) by mouth daily.  Marland Kitchen losartan (COZAAR) 50 MG tablet Take 50 mg by mouth daily.  Marland Kitchen omeprazole (PRILOSEC) 20 MG capsule Take 1 capsule (20 mg total) by mouth daily.  . pregabalin (LYRICA) 150 MG capsule Take 1 capsule (150 mg total) by mouth 3 (three) times daily.  . Tapentadol HCl (NUCYNTA ER) 250 MG TB12 Take 250 mg by mouth 2 (two) times daily.  . Tapentadol HCl (NUCYNTA) 100 MG TABS Take 100 mg by mouth 4 (four) times daily.   No current facility-administered medications for this visit. (Other)      REVIEW OF SYSTEMS:    ALLERGIES Allergies  Allergen Reactions  . Morphine And Related Nausea Only    PAST MEDICAL HISTORY Past Medical History:  Diagnosis Date  .  Depressive disorder, not elsewhere classified   . Facet syndrome, lumbar   . Hypertension   . Lesion of sciatic nerve   . Lumbosacral spondylosis without myelopathy   . Other chronic postoperative pain   . Sciatic neuritis   . Spinal stenosis, lumbar region, without neurogenic claudication    Past Surgical History:  Procedure Laterality Date  . KNEE SURGERY      FAMILY HISTORY Family History  Problem Relation Age of Onset  . Arthritis Mother   . Diabetes Brother     SOCIAL HISTORY Social History   Tobacco Use  . Smoking status: Current Every Day Smoker    Packs/day: 1.00    Years: 45.00    Pack years: 45.00    Types: Cigarettes  . Smokeless tobacco: Never Used  Substance Use Topics  . Alcohol use: No  . Drug use: No         OPHTHALMIC EXAM: Base Eye Exam    Visual Acuity (ETDRS)      Right Left   Dist [name] CF @ 4' 20/40 -3   Dist ph [place] 20/40 20/25 -1       Tonometry (Tonopen, 1:10 PM)      Right Left   Pressure 10  08       Pupils      Pupils Dark Light Shape React APD   Right PERRL 5 4 Round Brisk None   Left PERRL 5 4 Round Brisk None       Visual Fields (Counting fingers)      Left Right    Full Full       Extraocular Movement      Right Left    Full Full       Neuro/Psych    Oriented x3: Yes   Mood/Affect: Normal       Dilation    Right eye: 1.0% Mydriacyl, 2.5% Phenylephrine @ 1:10 PM        Slit Lamp and Fundus Exam    External Exam      Right Left   External Normal Normal       Slit Lamp Exam      Right Left   Lids/Lashes Normal Normal   Conjunctiva/Sclera White and quiet White and quiet   Cornea Clear Clear   Anterior Chamber Deep and quiet Deep and quiet   Iris Round and reactive Round and reactive   Lens Posterior chamber intraocular lens Posterior chamber intraocular lens   Anterior Vitreous Normal Normal       Fundus Exam      Right Left   Posterior Vitreous Normal    Disc Normal    C/D Ratio 0.3    Macula  Microaneurysms,  Cystoid macular edema    Vessels   Retinal vein occlusion inferotemporal to the fovea,    Periphery Normal           IMAGING AND PROCEDURES  Imaging and Procedures for 11/03/19  OCT, Retina - OU - Both Eyes       Right Eye Scan locations included subfoveal. Central Foveal Thickness: 228. Progression has improved. Findings include cystoid macular edema.   Left Eye Quality was good. Scan locations included subfoveal. Central Foveal Thickness: 281. Progression has been stable. Findings include normal foveal contour.   Notes Much less active CME inferior to the fovea OD at 93-month interval, also less center involved foveal edema       Intravitreal Injection, Pharmacologic Agent - OD - Right Eye       Time Out 11/03/2019. 2:06 PM. Confirmed correct patient, procedure, site, and patient consented.   Anesthesia Topical anesthesia was used. Anesthetic medications included Akten 3.5%.   Procedure Preparation included Tobramycin 0.3%, Ofloxacin , 10% betadine to eyelids, 5% betadine to ocular surface. A 30 gauge needle was used.   Injection:  1.25 mg Bevacizumab (AVASTIN) SOLN   NDC: 70360-001-02, Lot: [id number]   Route: Intravitreal, Site: Right Eye, Waste: 0 mg  Post-op Post injection exam found visual acuity of at least counting fingers. The patient tolerated the procedure well. There were no complications. The patient received written and verbal post procedure care education. Post injection medications were not given.                 ASSESSMENT/PLAN:  Branch retinal vein occlusion with macular edema of right eye The nature of branch retinal vein occlusion with macular edema was discussed.  The patient was given access to printed information.  The treatment options including continued observation looking for spontaneous resolution versus grid laser versus intravitreal Kenalog injection were discussed.  PRIMARY THERAPY CONSISTS of Anti-VEGF  Therapies, AVASTIN, LUCENTIS AND EYLEA.  Their usage was discussed to assist in halting the progression of Macular Edema, in  order to preserve, protect or improve acuity.  Additionally, at times, limited focal laser therapy is used in the management.  The risks and benefits of all these options were discussed with the patient.  The patient's questions were answered.  CME overall and subfoveal atrophy centrally accounts for residual acuity issues.  That there is no relationship with a small choroidal nevus posteriorly   Choroidal nevus, right All flat, 1.5 disc diameter size in the posterior pole, macula, no high risk features      ICD-10-CM   1. Branch retinal vein occlusion with macular edema of right eye  H34.8310 OCT, Retina - OU - Both Eyes    Intravitreal Injection, Pharmacologic Agent - OD - Right Eye    Bevacizumab (AVASTIN) SOLN 1.25 mg  2. Choroidal nevus, right  D31.31     1.  Macular branch retinal vein occlusion right eye vastly improved, currently at 10-week follow-up interval.  We will repeat injection today.  Involved CME has diminished with residual atrophy accounting for the acuity  2.  Observe choroidal nevus  3.  Ophthalmic Meds Ordered this visit:  Meds ordered this encounter  Medications  . Bevacizumab (AVASTIN) SOLN 1.25 mg       Return in about 3 months (around 02/03/2020) for DILATE OU, AVASTIN OCT, OD.  There are no Patient Instructions on file for this visit.   Explained the diagnoses, plan, and follow up with the patient and they expressed understanding.  Patient expressed understanding of the importance of proper follow up care.   Clent Demark Deserie Dirks M.D. Diseases & Surgery of the Retina and Vitreous Retina [facility] Diabetic [place] 11/03/19     Abbreviations: M myopia (nearsighted); A astigmatism; H hyperopia (farsighted); P presbyopia; Mrx spectacle prescription;  CTL contact lenses; OD right eye; OS left eye; OU both eyes  XT exotropia; ET esotropia;  PEK punctate epithelial keratitis; PEE punctate epithelial erosions; DES dry eye syndrome; MGD meibomian gland dysfunction; ATs artificial tears; PFAT's preservative free artificial tears; [place] nuclear sclerotic cataract; PSC posterior subcapsular cataract; ERM epi-retinal membrane; PVD posterior vitreous detachment; RD retinal detachment; DM diabetes mellitus; DR diabetic retinopathy; NPDR non-proliferative diabetic retinopathy; PDR proliferative diabetic retinopathy; CSME clinically significant macular edema; DME diabetic macular edema; dbh dot blot hemorrhages; CWS cotton wool spot; POAG primary open angle glaucoma; C/D cup-to-disc ratio; HVF humphrey visual field; GVF goldmann visual field; OCT optical coherence tomography; IOP intraocular pressure; BRVO Branch retinal vein occlusion; CRVO central retinal vein occlusion; CRAO central retinal artery occlusion; BRAO branch retinal artery occlusion; RT retinal tear; SB scleral buckle; PPV pars plana vitrectomy; VH Vitreous hemorrhage; PRP panretinal laser photocoagulation; IVK intravitreal kenalog; VMT vitreomacular traction; MH Macular hole;  NVD neovascularization of the disc; NVE neovascularization elsewhere; AREDS age related eye disease study; ARMD age related macular degeneration; POAG primary open angle glaucoma; EBMD epithelial/anterior basement membrane dystrophy; ACIOL anterior chamber intraocular lens; IOL intraocular lens; PCIOL posterior chamber intraocular lens; Phaco/IOL phacoemulsification with intraocular lens placement; [place] photorefractive keratectomy; LASIK laser assisted in situ keratomileusis; HTN hypertension; DM diabetes mellitus; COPD chronic obstructive pulmonary disease

## 2019-11-15 DIAGNOSIS — D696 Thrombocytopenia, unspecified: Secondary | ICD-10-CM | POA: Diagnosis not present

## 2019-11-15 DIAGNOSIS — E782 Mixed hyperlipidemia: Secondary | ICD-10-CM | POA: Diagnosis not present

## 2019-11-15 DIAGNOSIS — R7301 Impaired fasting glucose: Secondary | ICD-10-CM | POA: Diagnosis not present

## 2019-11-15 DIAGNOSIS — Z8673 Personal history of transient ischemic attack (TIA), and cerebral infarction without residual deficits: Secondary | ICD-10-CM | POA: Diagnosis not present

## 2019-11-15 DIAGNOSIS — M5440 Lumbago with sciatica, unspecified side: Secondary | ICD-10-CM | POA: Diagnosis not present

## 2019-11-15 DIAGNOSIS — E875 Hyperkalemia: Secondary | ICD-10-CM | POA: Diagnosis not present

## 2019-11-15 DIAGNOSIS — Z23 Encounter for immunization: Secondary | ICD-10-CM | POA: Diagnosis not present

## 2019-11-15 DIAGNOSIS — G894 Chronic pain syndrome: Secondary | ICD-10-CM | POA: Diagnosis not present

## 2019-11-15 DIAGNOSIS — I10 Essential (primary) hypertension: Secondary | ICD-10-CM | POA: Diagnosis not present

## 2020-02-06 ENCOUNTER — Ambulatory Visit (INDEPENDENT_AMBULATORY_CARE_PROVIDER_SITE_OTHER): Payer: Medicare PPO | Admitting: Ophthalmology

## 2020-02-06 ENCOUNTER — Encounter (INDEPENDENT_AMBULATORY_CARE_PROVIDER_SITE_OTHER): Payer: Self-pay | Admitting: Ophthalmology

## 2020-02-06 ENCOUNTER — Other Ambulatory Visit: Payer: Self-pay

## 2020-02-06 DIAGNOSIS — H2512 Age-related nuclear cataract, left eye: Secondary | ICD-10-CM | POA: Diagnosis not present

## 2020-02-06 DIAGNOSIS — D3131 Benign neoplasm of right choroid: Secondary | ICD-10-CM | POA: Diagnosis not present

## 2020-02-06 DIAGNOSIS — H34831 Tributary (branch) retinal vein occlusion, right eye, with macular edema: Secondary | ICD-10-CM

## 2020-02-06 DIAGNOSIS — H2511 Age-related nuclear cataract, right eye: Secondary | ICD-10-CM

## 2020-02-06 DIAGNOSIS — H2513 Age-related nuclear cataract, bilateral: Secondary | ICD-10-CM

## 2020-02-06 MED ORDER — BEVACIZUMAB 2.5 MG/0.1ML IZ SOSY
2.5000 mg | PREFILLED_SYRINGE | INTRAVITREAL | Status: AC | PRN
  Administered 2020-02-06: 2.5 mg via INTRAVITREAL

## 2020-02-06 NOTE — Assessment & Plan Note (Signed)
No interval change OD

## 2020-02-06 NOTE — Assessment & Plan Note (Signed)
Branch retinal vein occlusion with secondary cystoid macular edema will continue to treat today at 25-month interval.  Recurrence of CME has developed.  Will need short interval follow-up examination out of 6 weeks and likely retreat at that time

## 2020-02-06 NOTE — Progress Notes (Signed)
02/06/2020     CHIEF COMPLAINT Patient presents for Retina Follow Up (3 Month F/U OU, poss Avastin OD//Pt denies noticeable changes to [place] OU since last visit. Pt denies ocular pain, flashes of light, or floaters OU. //)   HISTORY OF PRESENT ILLNESS: Dylan Farley is a 65 y.o. male who presents to the clinic today for:   HPI    Retina Follow Up    Patient presents with  CRVO/BRVO.  In right eye.  This started 3 months ago.  Severity is mild.  Duration of 3 months.  Since onset it is stable. Additional comments: 3 Month F/U OU, poss Avastin OD  Pt denies noticeable changes to [place] OU since last visit. Pt denies ocular pain, flashes of light, or floaters OU.          Last edited by Rockie Neighbours, [place] on 02/06/2020  1:46 PM. (History)      Referring physician: Celene Squibb, MD [place],   [id number]  HISTORICAL INFORMATION:   Selected notes from the MEDICAL RECORD NUMBER    Lab Results  Component Value Date   HGBA1C 5.3 08/31/2017     CURRENT MEDICATIONS: No current outpatient medications on file. (Ophthalmic Drugs)   No current facility-administered medications for this visit. (Ophthalmic Drugs)   Current Outpatient Medications (Other)  Medication Sig  . atorvastatin (LIPITOR) 40 MG tablet Take 1 tablet (40 mg total) by mouth daily at 6 PM.  . clopidogrel (PLAVIX) 75 MG tablet Take 1 tablet (75 mg total) by mouth daily.  Marland Kitchen losartan (COZAAR) 50 MG tablet Take 50 mg by mouth daily.  Marland Kitchen omeprazole (PRILOSEC) 20 MG capsule Take 1 capsule (20 mg total) by mouth daily.  . pregabalin (LYRICA) 150 MG capsule Take 1 capsule (150 mg total) by mouth 3 (three) times daily.  . Tapentadol HCl (NUCYNTA ER) 250 MG TB12 Take 250 mg by mouth 2 (two) times daily.  . Tapentadol HCl (NUCYNTA) 100 MG TABS Take 100 mg by mouth 4 (four) times daily.   No current facility-administered medications for this visit. (Other)      REVIEW OF  SYSTEMS:    ALLERGIES Allergies  Allergen Reactions  . Morphine And Related Nausea Only    PAST MEDICAL HISTORY Past Medical History:  Diagnosis Date  . Depressive disorder, not elsewhere classified   . Facet syndrome, lumbar   . Hypertension   . Lesion of sciatic nerve   . Lumbosacral spondylosis without myelopathy   . Other chronic postoperative pain   . Sciatic neuritis   . Spinal stenosis, lumbar region, without neurogenic claudication    Past Surgical History:  Procedure Laterality Date  . KNEE SURGERY      FAMILY HISTORY Family History  Problem Relation Age of Onset  . Arthritis Mother   . Diabetes Brother     SOCIAL HISTORY Social History   Tobacco Use  . Smoking status: Current Every Day Smoker    Packs/day: 1.00    Years: 45.00    Pack years: 45.00    Types: Cigarettes  . Smokeless tobacco: Never Used  Substance Use Topics  . Alcohol use: No  . Drug use: No         OPHTHALMIC EXAM:  Base Eye Exam    Visual Acuity (ETDRS)      Right Left   Dist [place] 20/400 20/30   Dist ph [place] 20/200 20/25       Tonometry (Tonopen,  1:46 PM)      Right Left   Pressure 13 14       Pupils      Pupils Dark Light Shape React APD   Right PERRL 5 4 Round Brisk None   Left PERRL 5 4 Round Brisk None       Visual Fields (Counting fingers)      Left Right    Full Full       Extraocular Movement      Right Left    Full Full       Neuro/Psych    Oriented x3: Yes   Mood/Affect: Normal       Dilation    Both eyes: 1.0% Mydriacyl, 2.5% Phenylephrine @ 1:50 PM        Slit Lamp and Fundus Exam    External Exam      Right Left   External Normal Normal       Slit Lamp Exam      Right Left   Lids/Lashes Normal Normal   Conjunctiva/Sclera White and quiet White and quiet   Cornea Clear Clear   Anterior Chamber Deep and quiet Deep and quiet   Iris Round and reactive Round and reactive   Lens 2+ Nuclear sclerosis 2+ Nuclear sclerosis   Anterior  Vitreous Normal Normal       Fundus Exam      Right Left   Posterior Vitreous Normal    Disc Normal    C/D Ratio 0.3    Macula Microaneurysms,  Cystoid macular edema    Vessels   Retinal vein occlusion inferotemporal to the fovea,    Periphery Normal           IMAGING AND PROCEDURES  Imaging and Procedures for 02/06/20  OCT, Retina - OU - Both Eyes       Right Eye Quality was good. Scan locations included subfoveal. Central Foveal Thickness: 342. Progression has worsened. Findings include cystoid macular edema.   Left Eye Quality was good. Scan locations included subfoveal. Central Foveal Thickness: 287. Progression has been stable. Findings include normal foveal contour.   Notes Cystoid macular edema of CME has returned from BRVO, will need to restart Intravitreal Avastin today       Intravitreal Injection, Pharmacologic Agent - OD - Right Eye       Time Out 02/06/2020. 2:19 PM. Confirmed correct patient, procedure, site, and patient consented.   Anesthesia Topical anesthesia was used. Anesthetic medications included Akten 3.5%.   Procedure Preparation included Tobramycin 0.3%, Ofloxacin , 10% betadine to eyelids, 5% betadine to ocular surface. A 30 gauge needle was used.   Injection:  2.5 mg Bevacizumab (AVASTIN) 2.5mg /0.34mL SOSY   NDC: 01601-093-23   Route: Intravitreal, Site: Right Eye  Post-op Post injection exam found visual acuity of at least counting fingers. The patient tolerated the procedure well. There were no complications. The patient received written and verbal post procedure care education. Post injection medications were not given.                 ASSESSMENT/PLAN:  Branch retinal vein occlusion with macular edema of right eye Branch retinal vein occlusion with secondary cystoid macular edema will continue to treat today at 60-month interval.  Recurrence of CME has developed.  Will need short interval follow-up examination out of 6  weeks and likely retreat at that time  Choroidal nevus, right No change over the last 3 months  Nuclear sclerotic cataract of right eye No  interval change OD  Nuclear sclerotic cataract of left eye Stable over time      ICD-10-CM   1. Branch retinal vein occlusion with macular edema of right eye  H34.8310 OCT, Retina - OU - Both Eyes    Intravitreal Injection, Pharmacologic Agent - OD - Right Eye    bevacizumab (AVASTIN) SOSY 2.5 mg  2. Choroidal nevus, right  D31.31   3. Nuclear sclerotic cataract of right eye  H25.11   4. Nuclear sclerotic cataract of left eye  H25.12     1.  Recurrent cystoid macular edema at 38-month follow-up from branch retinal vein occlusion OD.  2.  We will need to restart intravitreal Avastin today and follow-up examination in 6 weeks  3.  Ophthalmic Meds Ordered this visit:  Meds ordered this encounter  Medications  . bevacizumab (AVASTIN) SOSY 2.5 mg       Return in about 6 weeks (around 03/19/2020) for dilate, OD, AVASTIN OCT.  There are no Patient Instructions on file for this visit.   Explained the diagnoses, plan, and follow up with the patient and they expressed understanding.  Patient expressed understanding of the importance of proper follow up care.   Clent Demark Sherlon Nied M.D. Diseases & Surgery of the Retina and Vitreous Retina & Diabetic [place] 02/06/20     Abbreviations: M myopia (nearsighted); A astigmatism; H hyperopia (farsighted); P presbyopia; Mrx spectacle prescription;  CTL contact lenses; OD right eye; OS left eye; OU both eyes  XT exotropia; ET esotropia; PEK punctate epithelial keratitis; PEE punctate epithelial erosions; DES dry eye syndrome; MGD meibomian gland dysfunction; ATs artificial tears; PFAT's preservative free artificial tears; [place] nuclear sclerotic cataract; PSC posterior subcapsular cataract; ERM epi-retinal membrane; PVD posterior vitreous detachment; RD retinal detachment; DM diabetes mellitus; DR diabetic  retinopathy; NPDR non-proliferative diabetic retinopathy; PDR proliferative diabetic retinopathy; CSME clinically significant macular edema; DME diabetic macular edema; dbh dot blot hemorrhages; CWS cotton wool spot; POAG primary open angle glaucoma; C/D cup-to-disc ratio; HVF humphrey visual field; GVF goldmann visual field; OCT optical coherence tomography; IOP intraocular pressure; BRVO Branch retinal vein occlusion; CRVO central retinal vein occlusion; CRAO central retinal artery occlusion; BRAO branch retinal artery occlusion; RT retinal tear; SB scleral buckle; PPV pars plana vitrectomy; VH Vitreous hemorrhage; PRP panretinal laser photocoagulation; IVK intravitreal kenalog; VMT vitreomacular traction; MH Macular hole;  NVD neovascularization of the disc; NVE neovascularization elsewhere; AREDS age related eye disease study; ARMD age related macular degeneration; POAG primary open angle glaucoma; EBMD epithelial/anterior basement membrane dystrophy; ACIOL anterior chamber intraocular lens; IOL intraocular lens; PCIOL posterior chamber intraocular lens; Phaco/IOL phacoemulsification with intraocular lens placement; [place] photorefractive keratectomy; LASIK laser assisted in situ keratomileusis; HTN hypertension; DM diabetes mellitus; COPD chronic obstructive pulmonary disease

## 2020-02-06 NOTE — Assessment & Plan Note (Signed)
No change over the last 3 months

## 2020-02-06 NOTE — Assessment & Plan Note (Signed)
Stable over time. 

## 2020-02-16 DIAGNOSIS — R0981 Nasal congestion: Secondary | ICD-10-CM | POA: Diagnosis not present

## 2020-02-16 DIAGNOSIS — Z712 Person consulting for explanation of examination or test findings: Secondary | ICD-10-CM | POA: Diagnosis not present

## 2020-02-16 DIAGNOSIS — Z8673 Personal history of transient ischemic attack (TIA), and cerebral infarction without residual deficits: Secondary | ICD-10-CM | POA: Diagnosis not present

## 2020-02-16 DIAGNOSIS — Z0001 Encounter for general adult medical examination with abnormal findings: Secondary | ICD-10-CM | POA: Diagnosis not present

## 2020-02-16 DIAGNOSIS — I499 Cardiac arrhythmia, unspecified: Secondary | ICD-10-CM | POA: Diagnosis not present

## 2020-02-16 DIAGNOSIS — Z09 Encounter for follow-up examination after completed treatment for conditions other than malignant neoplasm: Secondary | ICD-10-CM | POA: Diagnosis not present

## 2020-02-16 DIAGNOSIS — M5440 Lumbago with sciatica, unspecified side: Secondary | ICD-10-CM | POA: Diagnosis not present

## 2020-02-16 DIAGNOSIS — K219 Gastro-esophageal reflux disease without esophagitis: Secondary | ICD-10-CM | POA: Diagnosis not present

## 2020-02-16 DIAGNOSIS — Z0000 Encounter for general adult medical examination without abnormal findings: Secondary | ICD-10-CM | POA: Diagnosis not present

## 2020-02-20 DIAGNOSIS — E782 Mixed hyperlipidemia: Secondary | ICD-10-CM | POA: Diagnosis not present

## 2020-02-20 DIAGNOSIS — G894 Chronic pain syndrome: Secondary | ICD-10-CM | POA: Diagnosis not present

## 2020-02-20 DIAGNOSIS — I10 Essential (primary) hypertension: Secondary | ICD-10-CM | POA: Diagnosis not present

## 2020-02-20 DIAGNOSIS — E875 Hyperkalemia: Secondary | ICD-10-CM | POA: Diagnosis not present

## 2020-02-20 DIAGNOSIS — R7301 Impaired fasting glucose: Secondary | ICD-10-CM | POA: Diagnosis not present

## 2020-02-20 DIAGNOSIS — D696 Thrombocytopenia, unspecified: Secondary | ICD-10-CM | POA: Diagnosis not present

## 2020-02-20 DIAGNOSIS — H353210 Exudative age-related macular degeneration, right eye, stage unspecified: Secondary | ICD-10-CM | POA: Diagnosis not present

## 2020-02-20 DIAGNOSIS — M5440 Lumbago with sciatica, unspecified side: Secondary | ICD-10-CM | POA: Diagnosis not present

## 2020-02-20 DIAGNOSIS — Z8673 Personal history of transient ischemic attack (TIA), and cerebral infarction without residual deficits: Secondary | ICD-10-CM | POA: Diagnosis not present

## 2020-03-12 DIAGNOSIS — E875 Hyperkalemia: Secondary | ICD-10-CM | POA: Diagnosis not present

## 2020-03-12 DIAGNOSIS — E782 Mixed hyperlipidemia: Secondary | ICD-10-CM | POA: Diagnosis not present

## 2020-03-12 DIAGNOSIS — H353210 Exudative age-related macular degeneration, right eye, stage unspecified: Secondary | ICD-10-CM | POA: Diagnosis not present

## 2020-03-12 DIAGNOSIS — K219 Gastro-esophageal reflux disease without esophagitis: Secondary | ICD-10-CM | POA: Diagnosis not present

## 2020-03-12 DIAGNOSIS — I10 Essential (primary) hypertension: Secondary | ICD-10-CM | POA: Diagnosis not present

## 2020-03-12 DIAGNOSIS — R7303 Prediabetes: Secondary | ICD-10-CM | POA: Diagnosis not present

## 2020-03-12 DIAGNOSIS — D696 Thrombocytopenia, unspecified: Secondary | ICD-10-CM | POA: Diagnosis not present

## 2020-03-12 DIAGNOSIS — Z8673 Personal history of transient ischemic attack (TIA), and cerebral infarction without residual deficits: Secondary | ICD-10-CM | POA: Diagnosis not present

## 2020-03-12 DIAGNOSIS — M5440 Lumbago with sciatica, unspecified side: Secondary | ICD-10-CM | POA: Diagnosis not present

## 2020-03-19 ENCOUNTER — Encounter (INDEPENDENT_AMBULATORY_CARE_PROVIDER_SITE_OTHER): Payer: Self-pay | Admitting: Ophthalmology

## 2020-03-19 ENCOUNTER — Ambulatory Visit (INDEPENDENT_AMBULATORY_CARE_PROVIDER_SITE_OTHER): Payer: Medicare PPO | Admitting: Ophthalmology

## 2020-03-19 ENCOUNTER — Other Ambulatory Visit: Payer: Self-pay

## 2020-03-19 DIAGNOSIS — H2511 Age-related nuclear cataract, right eye: Secondary | ICD-10-CM | POA: Diagnosis not present

## 2020-03-19 DIAGNOSIS — H34831 Tributary (branch) retinal vein occlusion, right eye, with macular edema: Secondary | ICD-10-CM | POA: Diagnosis not present

## 2020-03-19 DIAGNOSIS — H3411 Central retinal artery occlusion, right eye: Secondary | ICD-10-CM

## 2020-03-19 MED ORDER — BEVACIZUMAB 2.5 MG/0.1ML IZ SOSY
2.5000 mg | PREFILLED_SYRINGE | INTRAVITREAL | Status: AC | PRN
  Administered 2020-03-19: 2.5 mg via INTRAVITREAL

## 2020-03-19 NOTE — Assessment & Plan Note (Signed)
The nature of branch retinal vein occlusion with macular edema was discussed.  The patient was given access to printed information.  The treatment options including continued observation looking for spontaneous resolution versus grid laser versus intravitreal Kenalog injection were discussed.  PRIMARY THERAPY CONSISTS of Anti-VEGF Therapies, AVASTIN, LUCENTIS AND EYLEA.  Their usage was discussed to assist in halting the progression of Macular Edema, in order to preserve, protect or improve acuity.  Additionally, at times, limited focal laser therapy is used in the management.  The risks and benefits of all these options were discussed with the patient.  The patient's questions were answered. 

## 2020-03-19 NOTE — Progress Notes (Addendum)
03/19/2020     CHIEF COMPLAINT Patient presents for Retina Follow Up (6 Week BRVO f\u OD. Possible Avastin OD. OCT/Pt states no changes in vision. Denies complaints.)   HISTORY OF PRESENT ILLNESS: Dylan Farley is a 65 y.o. male who presents to the clinic today for:   HPI    Retina Follow Up    Patient presents with  CRVO/BRVO.  In right eye.  Severity is moderate.  Duration of 6 weeks.  Since onset it is stable.  I, the attending physician,  performed the HPI with the patient and updated documentation appropriately. Additional comments: 6 Week BRVO f\u OD. Possible Avastin OD. OCT Pt states no changes in vision. Denies complaints.       Last edited by Tilda Franco on 03/19/2020  2:04 PM. (History)      Referring physician: Celene Squibb, MD [place],  [place] [address]  HISTORICAL INFORMATION:   Selected notes from the MEDICAL RECORD NUMBER    Lab Results  Component Value Date   HGBA1C 5.3 08/31/2017     CURRENT MEDICATIONS: No current outpatient medications on file. (Ophthalmic Drugs)   No current facility-administered medications for this visit. (Ophthalmic Drugs)   Current Outpatient Medications (Other)  Medication Sig  . atorvastatin (LIPITOR) 40 MG tablet Take 1 tablet (40 mg total) by mouth daily at 6 PM.  . clopidogrel (PLAVIX) 75 MG tablet Take 1 tablet (75 mg total) by mouth daily.  Marland Kitchen losartan (COZAAR) 50 MG tablet Take 50 mg by mouth daily.  Marland Kitchen omeprazole (PRILOSEC) 20 MG capsule Take 1 capsule (20 mg total) by mouth daily.  . pregabalin (LYRICA) 150 MG capsule Take 1 capsule (150 mg total) by mouth 3 (three) times daily.  . Tapentadol HCl (NUCYNTA ER) 250 MG TB12 Take 250 mg by mouth 2 (two) times daily.  . Tapentadol HCl (NUCYNTA) 100 MG TABS Take 100 mg by mouth 4 (four) times daily.   No current facility-administered medications for this visit. (Other)      REVIEW OF SYSTEMS:    ALLERGIES Allergies  Allergen Reactions  .  Morphine And Related Nausea Only    PAST MEDICAL HISTORY Past Medical History:  Diagnosis Date  . Depressive disorder, not elsewhere classified   . Facet syndrome, lumbar   . Hypertension   . Lesion of sciatic nerve   . Lumbosacral spondylosis without myelopathy   . Other chronic postoperative pain   . Sciatic neuritis   . Spinal stenosis, lumbar region, without neurogenic claudication    Past Surgical History:  Procedure Laterality Date  . KNEE SURGERY      FAMILY HISTORY Family History  Problem Relation Age of Onset  . Arthritis Mother   . Diabetes Brother     SOCIAL HISTORY Social History   Tobacco Use  . Smoking status: Current Every Day Smoker    Packs/day: 1.00    Years: 45.00    Pack years: 45.00    Types: Cigarettes  . Smokeless tobacco: Never Used  Substance Use Topics  . Alcohol use: No  . Drug use: No         OPHTHALMIC EXAM:  Base Eye Exam    Visual Acuity (Snellen - Linear)      Right Left   Dist [place] 20/400 20/25 -1   Dist ph [place] 20/200        Tonometry (Tonopen, 2:07 PM)      Right Left   Pressure  15 13       Pupils      Pupils Dark Light Shape React APD   Right PERRL 5 4 Round Brisk None   Left PERRL 5 4 Round Brisk None       Visual Fields (Counting fingers)      Left Right    Full Full       Neuro/Psych    Oriented x3: Yes   Mood/Affect: Normal       Dilation    Right eye: 1.0% Mydriacyl, 2.5% Phenylephrine @ 2:07 PM        Slit Lamp and Fundus Exam    External Exam      Right Left   External Normal Normal       Slit Lamp Exam      Right Left   Lids/Lashes Normal Normal   Conjunctiva/Sclera White and quiet White and quiet   Cornea Clear Clear   Anterior Chamber Deep and quiet Deep and quiet   Iris Round and reactive Round and reactive   Lens 2+ Nuclear sclerosis 2+ Nuclear sclerosis   Anterior Vitreous Normal Normal       Fundus Exam      Right Left   Posterior Vitreous Normal    Disc Normal    C/D  Ratio 0.3    Macula Microaneurysms,  Cystoid macular edema    Vessels   Retinal vein occlusion inferotemporal to the fovea,    Periphery Normal           IMAGING AND PROCEDURES  Imaging and Procedures for 03/19/20  OCT, Retina - OU - Both Eyes       Right Eye Quality was good. Scan locations included subfoveal. Central Foveal Thickness: 233. Progression has worsened. Findings include cystoid macular edema, abnormal foveal contour.   Left Eye Scan locations included subfoveal. Central Foveal Thickness: 287. Progression has been stable. Findings include normal foveal contour.   Notes Much improved cystoid macular edema of CME has returned from BRVO,  now 6 weeks post recent Avastin injection OD repeat injection OD next in 6 weeks       Intravitreal Injection, Pharmacologic Agent - OD - Right Eye       Time Out 03/19/2020. 3:10 PM. Confirmed correct patient, procedure, site, and patient consented.   Anesthesia Topical anesthesia was used. Anesthetic medications included Akten 3.5%.   Procedure Preparation included Tobramycin 0.3%, Ofloxacin , 10% betadine to eyelids, 5% betadine to ocular surface. A 30 gauge needle was used.   Injection:  2.5 mg Bevacizumab (AVASTIN) 2.5mg /0.29mL SOSY   NDC: 05397-673-41, Lot: [id number]   Route: Intravitreal, Site: Right Eye  Post-op Post injection exam found visual acuity of at least counting fingers. The patient tolerated the procedure well. There were no complications. The patient received written and verbal post procedure care education. Post injection medications were not given.                 ASSESSMENT/PLAN:  Branch retinal vein occlusion with macular edema of right eye The nature of branch retinal vein occlusion with macular edema was discussed.  The patient was given access to printed information.  The treatment options including continued observation looking for spontaneous resolution versus grid laser versus  intravitreal Kenalog injection were discussed.  PRIMARY THERAPY CONSISTS of Anti-VEGF Therapies, AVASTIN, LUCENTIS AND EYLEA.  Their usage was discussed to assist in halting the progression of Macular Edema, in order to preserve, protect or improve acuity.  Additionally, at times,  limited focal laser therapy is used in the management.  The risks and benefits of all these options were discussed with the patient.  The patient's questions were answered.  Central retinal artery occlusion of right eye Visual acuity improved overall  Nuclear sclerotic cataract of right eye The nature of cataract was discussed with the patient as well as the elective nature of surgery. The patient was reassured that surgery at a later date does not put the patient at risk for a worse outcome. It was emphasized that the need for surgery is dictated by the patient's quality of life as influenced by the cataract. Patient was instructed to maintain close follow up with their general eye care doctor.      ICD-10-CM   1. Branch retinal vein occlusion with macular edema of right eye  H34.8310 OCT, Retina - OU - Both Eyes    Intravitreal Injection, Pharmacologic Agent - OD - Right Eye    bevacizumab (AVASTIN) SOSY 2.5 mg  2. Central retinal artery occlusion of right eye  H34.11   3. Nuclear sclerotic cataract of right eye  H25.11     1.  Improved CME adjacent to the fovea OD at 6-week follow-up post injection Avastin will repeat injection today  2.  Late OD next in 6 weeks  3.  Ophthalmic Meds Ordered this visit:  Meds ordered this encounter  Medications  . bevacizumab (AVASTIN) SOSY 2.5 mg       Return in about 6 weeks (around 04/30/2020) for dilate, OD, AVASTIN OCT.  There are no Patient Instructions on file for this visit.   Explained the diagnoses, plan, and follow up with the patient and they expressed understanding.  Patient expressed understanding of the importance of proper follow up care.   Clent Demark  Tsering Leaman M.D. Diseases & Surgery of the Retina and Vitreous [facility] [place] 03/19/20     Abbreviations: M myopia (nearsighted); A astigmatism; H hyperopia (farsighted); P presbyopia; Mrx spectacle prescription;  CTL contact lenses; OD right eye; OS left eye; OU both eyes  XT exotropia; ET esotropia; PEK punctate epithelial keratitis; PEE punctate epithelial erosions; DES dry eye syndrome; MGD meibomian gland dysfunction; ATs artificial tears; PFAT's preservative free artificial tears; [place] nuclear sclerotic cataract; PSC posterior subcapsular cataract; ERM epi-retinal membrane; PVD posterior vitreous detachment; RD retinal detachment; DM diabetes mellitus; DR diabetic retinopathy; NPDR non-proliferative diabetic retinopathy; PDR proliferative diabetic retinopathy; CSME clinically significant macular edema; DME diabetic macular edema; dbh dot blot hemorrhages; CWS cotton wool spot; POAG primary open angle glaucoma; C/D cup-to-disc ratio; HVF humphrey visual field; GVF goldmann visual field; OCT optical coherence tomography; IOP intraocular pressure; BRVO Branch retinal vein occlusion; CRVO central retinal vein occlusion; CRAO central retinal artery occlusion; BRAO branch retinal artery occlusion; RT retinal tear; SB scleral buckle; PPV pars plana vitrectomy; VH Vitreous hemorrhage; PRP panretinal laser photocoagulation; IVK intravitreal kenalog; VMT vitreomacular traction; MH Macular hole;  NVD neovascularization of the disc; NVE neovascularization elsewhere; AREDS age related eye disease study; ARMD age related macular degeneration; POAG primary open angle glaucoma; EBMD epithelial/anterior basement membrane dystrophy; ACIOL anterior chamber intraocular lens; IOL intraocular lens; PCIOL posterior chamber intraocular lens; Phaco/IOL phacoemulsification with intraocular lens placement; [place] photorefractive keratectomy; LASIK laser assisted in situ keratomileusis; HTN hypertension; DM diabetes  mellitus; COPD chronic obstructive pulmonary disease

## 2020-03-19 NOTE — Assessment & Plan Note (Signed)

## 2020-03-19 NOTE — Assessment & Plan Note (Signed)
Visual acuity improved overall

## 2020-04-30 ENCOUNTER — Ambulatory Visit (INDEPENDENT_AMBULATORY_CARE_PROVIDER_SITE_OTHER): Payer: Medicare PPO | Admitting: Ophthalmology

## 2020-04-30 ENCOUNTER — Encounter (INDEPENDENT_AMBULATORY_CARE_PROVIDER_SITE_OTHER): Payer: Self-pay | Admitting: Ophthalmology

## 2020-04-30 ENCOUNTER — Other Ambulatory Visit: Payer: Self-pay

## 2020-04-30 DIAGNOSIS — H2511 Age-related nuclear cataract, right eye: Secondary | ICD-10-CM

## 2020-04-30 DIAGNOSIS — H34831 Tributary (branch) retinal vein occlusion, right eye, with macular edema: Secondary | ICD-10-CM | POA: Diagnosis not present

## 2020-04-30 MED ORDER — BEVACIZUMAB 2.5 MG/0.1ML IZ SOSY
2.5000 mg | PREFILLED_SYRINGE | INTRAVITREAL | Status: AC | PRN
  Administered 2020-04-30: 2.5 mg via INTRAVITREAL

## 2020-04-30 NOTE — Assessment & Plan Note (Signed)
Much improved CME, currently still at 6-week follow-up.  We will repeat injection today and extend interval of examination next to 8 weeks

## 2020-04-30 NOTE — Progress Notes (Signed)
04/30/2020     CHIEF COMPLAINT Patient presents for Retina Follow Up (6 wk fu OD. Possible Avastn OD//Pt states VA OU stable since last visit. Pt denies FOL, floaters, or ocular pain OU. /)   HISTORY OF PRESENT ILLNESS: Dylan Farley is a 65 y.o. male who presents to the clinic today for:   HPI    Retina Follow Up    Patient presents with  CRVO/BRVO.  In right eye.  This started 6 weeks ago.  Severity is mild.  Duration of 6 weeks.  Since onset it is stable. Additional comments: 6 wk fu OD. Possible Avastn OD  Pt states VA OU stable since last visit. Pt denies FOL, floaters, or ocular pain OU.         Last edited by Kendra Opitz, COA on 04/30/2020  8:06 AM. (History)      Referring physician: Celene Squibb, MD [place],  [place] [id number]  HISTORICAL INFORMATION:   Selected notes from the MEDICAL RECORD NUMBER    Lab Results  Component Value Date   HGBA1C 5.3 08/31/2017     CURRENT MEDICATIONS: No current outpatient medications on file. (Ophthalmic Drugs)   No current facility-administered medications for this visit. (Ophthalmic Drugs)   Current Outpatient Medications (Other)  Medication Sig  . atorvastatin (LIPITOR) 40 MG tablet Take 1 tablet (40 mg total) by mouth daily at 6 PM.  . clopidogrel (PLAVIX) 75 MG tablet Take 1 tablet (75 mg total) by mouth daily.  Marland Kitchen losartan (COZAAR) 50 MG tablet Take 50 mg by mouth daily.  Marland Kitchen omeprazole (PRILOSEC) 20 MG capsule Take 1 capsule (20 mg total) by mouth daily.  . pregabalin (LYRICA) 150 MG capsule Take 1 capsule (150 mg total) by mouth 3 (three) times daily.  . Tapentadol HCl (NUCYNTA ER) 250 MG TB12 Take 250 mg by mouth 2 (two) times daily.  . Tapentadol HCl (NUCYNTA) 100 MG TABS Take 100 mg by mouth 4 (four) times daily.   No current facility-administered medications for this visit. (Other)      REVIEW OF SYSTEMS:    ALLERGIES Allergies  Allergen Reactions  . Morphine And Related Nausea Only     PAST MEDICAL HISTORY Past Medical History:  Diagnosis Date  . Depressive disorder, not elsewhere classified   . Facet syndrome, lumbar   . Hypertension   . Lesion of sciatic nerve   . Lumbosacral spondylosis without myelopathy   . Other chronic postoperative pain   . Sciatic neuritis   . Spinal stenosis, lumbar region, without neurogenic claudication    Past Surgical History:  Procedure Laterality Date  . KNEE SURGERY      FAMILY HISTORY Family History  Problem Relation Age of Onset  . Arthritis Mother   . Diabetes Brother     SOCIAL HISTORY Social History   Tobacco Use  . Smoking status: Current Every Day Smoker    Packs/day: 1.00    Years: 45.00    Pack years: 45.00    Types: Cigarettes  . Smokeless tobacco: Never Used  Substance Use Topics  . Alcohol use: No  . Drug use: No         OPHTHALMIC EXAM: Base Eye Exam    Visual Acuity (ETDRS)      Right Left   Dist [place] 20/400 20/30 -2   Dist ph [place] 20/20 -2       Tonometry (Tonopen, 8:10 AM)  Right Left   Pressure 14 12       Pupils      Pupils Dark Light Shape React APD   Right PERRL 5 4 Round Brisk None   Left PERRL 5 4 Round Brisk None       Visual Fields (Counting fingers)      Left Right    Full Full       Extraocular Movement      Right Left    Full Full       Neuro/Psych    Oriented x3: Yes   Mood/Affect: Normal       Dilation    Right eye: 1.0% Mydriacyl, 2.5% Phenylephrine @ 8:10 AM        Slit Lamp and Fundus Exam    External Exam      Right Left   External Normal Normal       Slit Lamp Exam      Right Left   Lids/Lashes Normal Normal   Conjunctiva/Sclera White and quiet White and quiet   Cornea Clear Clear   Anterior Chamber Deep and quiet Deep and quiet   Iris Round and reactive Round and reactive   Lens 2+ Nuclear sclerosis 2+ Nuclear sclerosis   Anterior Vitreous Normal Normal       Fundus Exam      Right Left   Posterior Vitreous Normal    Disc  Normal    C/D Ratio 0.3    Macula Microaneurysms, no clinical thickening    Vessels Retinal vein occlusion inferotemporal to the fovea,, with much less intraretinal hemorrhage now    Periphery Normal           IMAGING AND PROCEDURES  Imaging and Procedures for 04/30/20  OCT, Retina - OU - Both Eyes       Right Eye Quality was good. Scan locations included subfoveal. Central Foveal Thickness: 224. Progression has worsened. Findings include cystoid macular edema, abnormal foveal contour.   Left Eye Scan locations included subfoveal. Central Foveal Thickness: 284. Progression has been stable. Findings include normal foveal contour.   Notes Much improved cystoid macular edema of CME has returned from BRVO,  now 6 weeks post recent Avastin injection OD repeat injection OD next in  8 weeks       Intravitreal Injection, Pharmacologic Agent - OD - Right Eye       Time Out 04/30/2020. 8:33 AM. Confirmed correct patient, procedure, site, and patient consented.   Anesthesia Topical anesthesia was used. Anesthetic medications included Akten 3.5%.   Procedure Preparation included Tobramycin 0.3%, Ofloxacin , 10% betadine to eyelids, 5% betadine to ocular surface. A 30 gauge needle was used.   Injection:  2.5 mg Bevacizumab (AVASTIN) 2.5mg /0.5mL SOSY   NDC: 16073-710-62, Lot: [id number]   Route: Intravitreal, Site: Right Eye  Post-op Post injection exam found visual acuity of at least counting fingers. The patient tolerated the procedure well. There were no complications. The patient received written and verbal post procedure care education. Post injection medications were not given.                 ASSESSMENT/PLAN:  Branch retinal vein occlusion with macular edema of right eye Much improved CME, currently still at 6-week follow-up.  We will repeat injection today and extend interval of examination next to 8 weeks  Nuclear sclerotic cataract of right eye Mild to moderate  on the nuclear sclerosis right eye, not visually impactful in the right eye at this time  ICD-10-CM   1. Branch retinal vein occlusion with macular edema of right eye  H34.8310 OCT, Retina - OU - Both Eyes    Intravitreal Injection, Pharmacologic Agent - OD - Right Eye    bevacizumab (AVASTIN) SOSY 2.5 mg  2. Nuclear sclerotic cataract of right eye  H25.11     1.  BRVO with secondary CME vastly improved overall Since onset of therapy now at 6-week interval.  We will repeat injection today and examination again in 8 weeks  2.  U next  3.  Ophthalmic Meds Ordered this visit:  Meds ordered this encounter  Medications  . bevacizumab (AVASTIN) SOSY 2.5 mg       Return in about 8 weeks (around 06/25/2020) for DILATE OU, AVASTIN OCT, OD.  There are no Patient Instructions on file for this visit.   Explained the diagnoses, plan, and follow up with the patient and they expressed understanding.  Patient expressed understanding of the importance of proper follow up care.   Clent Demark Yoshiaki Kreuser M.D. Diseases & Surgery of the Retina and Vitreous Retina [facility] Diabetic [place] 04/30/20     Abbreviations: M myopia (nearsighted); A astigmatism; H hyperopia (farsighted); P presbyopia; Mrx spectacle prescription;  CTL contact lenses; OD right eye; OS left eye; OU both eyes  XT exotropia; ET esotropia; PEK punctate epithelial keratitis; PEE punctate epithelial erosions; DES dry eye syndrome; MGD meibomian gland dysfunction; ATs artificial tears; PFAT's preservative free artificial tears; [place] nuclear sclerotic cataract; PSC posterior subcapsular cataract; ERM epi-retinal membrane; PVD posterior vitreous detachment; RD retinal detachment; DM diabetes mellitus; DR diabetic retinopathy; NPDR non-proliferative diabetic retinopathy; PDR proliferative diabetic retinopathy; CSME clinically significant macular edema; DME diabetic macular edema; dbh dot blot hemorrhages; CWS cotton wool spot; POAG primary open  angle glaucoma; C/D cup-to-disc ratio; HVF humphrey visual field; GVF goldmann visual field; OCT optical coherence tomography; IOP intraocular pressure; BRVO Branch retinal vein occlusion; CRVO central retinal vein occlusion; CRAO central retinal artery occlusion; BRAO branch retinal artery occlusion; RT retinal tear; SB scleral buckle; PPV pars plana vitrectomy; VH Vitreous hemorrhage; PRP panretinal laser photocoagulation; IVK intravitreal kenalog; VMT vitreomacular traction; MH Macular hole;  NVD neovascularization of the disc; NVE neovascularization elsewhere; AREDS age related eye disease study; ARMD age related macular degeneration; POAG primary open angle glaucoma; EBMD epithelial/anterior basement membrane dystrophy; ACIOL anterior chamber intraocular lens; IOL intraocular lens; PCIOL posterior chamber intraocular lens; Phaco/IOL phacoemulsification with intraocular lens placement; [place] photorefractive keratectomy; LASIK laser assisted in situ keratomileusis; HTN hypertension; DM diabetes mellitus; COPD chronic obstructive pulmonary disease

## 2020-04-30 NOTE — Assessment & Plan Note (Signed)
Mild to moderate on the nuclear sclerosis right eye, not visually impactful in the right eye at this time

## 2020-05-13 DIAGNOSIS — G894 Chronic pain syndrome: Secondary | ICD-10-CM | POA: Diagnosis not present

## 2020-05-13 DIAGNOSIS — I10 Essential (primary) hypertension: Secondary | ICD-10-CM | POA: Diagnosis not present

## 2020-05-13 DIAGNOSIS — K219 Gastro-esophageal reflux disease without esophagitis: Secondary | ICD-10-CM | POA: Diagnosis not present

## 2020-05-13 DIAGNOSIS — H3530 Unspecified macular degeneration: Secondary | ICD-10-CM | POA: Diagnosis not present

## 2020-05-21 DIAGNOSIS — G894 Chronic pain syndrome: Secondary | ICD-10-CM | POA: Diagnosis not present

## 2020-05-21 DIAGNOSIS — H3530 Unspecified macular degeneration: Secondary | ICD-10-CM | POA: Diagnosis not present

## 2020-05-21 DIAGNOSIS — I10 Essential (primary) hypertension: Secondary | ICD-10-CM | POA: Diagnosis not present

## 2020-06-25 ENCOUNTER — Other Ambulatory Visit: Payer: Self-pay

## 2020-06-25 ENCOUNTER — Encounter (INDEPENDENT_AMBULATORY_CARE_PROVIDER_SITE_OTHER): Payer: Medicare PPO | Admitting: Ophthalmology

## 2020-06-25 ENCOUNTER — Encounter (INDEPENDENT_AMBULATORY_CARE_PROVIDER_SITE_OTHER): Payer: Self-pay | Admitting: Ophthalmology

## 2020-06-25 ENCOUNTER — Ambulatory Visit (INDEPENDENT_AMBULATORY_CARE_PROVIDER_SITE_OTHER): Payer: Medicare PPO | Admitting: Ophthalmology

## 2020-06-25 DIAGNOSIS — H34831 Tributary (branch) retinal vein occlusion, right eye, with macular edema: Secondary | ICD-10-CM

## 2020-06-25 DIAGNOSIS — D3131 Benign neoplasm of right choroid: Secondary | ICD-10-CM | POA: Diagnosis not present

## 2020-06-25 DIAGNOSIS — H3411 Central retinal artery occlusion, right eye: Secondary | ICD-10-CM | POA: Diagnosis not present

## 2020-06-25 DIAGNOSIS — H2512 Age-related nuclear cataract, left eye: Secondary | ICD-10-CM

## 2020-06-25 DIAGNOSIS — H2513 Age-related nuclear cataract, bilateral: Secondary | ICD-10-CM

## 2020-06-25 DIAGNOSIS — H2511 Age-related nuclear cataract, right eye: Secondary | ICD-10-CM

## 2020-06-25 MED ORDER — BEVACIZUMAB 2.5 MG/0.1ML IZ SOSY
2.5000 mg | PREFILLED_SYRINGE | INTRAVITREAL | Status: AC | PRN
  Administered 2020-06-25: 2.5 mg via INTRAVITREAL

## 2020-06-25 NOTE — Assessment & Plan Note (Signed)
No interval change over the last 8 months

## 2020-06-25 NOTE — Progress Notes (Signed)
06/25/2020     CHIEF COMPLAINT Patient presents for Retina Follow Up (8 Wk F/U OU, poss Avastin OD//Pt denies noticeable changes to [place] OU since last visit. Pt denies ocular pain, flashes of light, or floaters OU. //)   HISTORY OF PRESENT ILLNESS: Dylan Farley is a 65 y.o. male who presents to the clinic today for:   HPI     Retina Follow Up           Diagnosis: CRVO/BRVO   Laterality: right eye   Onset: 8 weeks ago   Severity: mild   Duration: 8 weeks   Course: stable   Comments: 8 Wk F/U OU, poss Avastin OD  Pt denies noticeable changes to [place] OU since last visit. Pt denies ocular pain, flashes of light, or floaters OU.          Last edited by Rockie Neighbours, [place] on 06/25/2020  1:07 PM.      Referring physician: Celene Squibb, MD [place],   [id number]  HISTORICAL INFORMATION:   Selected notes from the MEDICAL RECORD NUMBER    Lab Results  Component Value Date   HGBA1C 5.3 08/31/2017     CURRENT MEDICATIONS: No current outpatient medications on file. (Ophthalmic Drugs)   No current facility-administered medications for this visit. (Ophthalmic Drugs)   Current Outpatient Medications (Other)  Medication Sig   atorvastatin (LIPITOR) 40 MG tablet Take 1 tablet (40 mg total) by mouth daily at 6 PM.   clopidogrel (PLAVIX) 75 MG tablet Take 1 tablet (75 mg total) by mouth daily.   losartan (COZAAR) 50 MG tablet Take 50 mg by mouth daily.   omeprazole (PRILOSEC) 20 MG capsule Take 1 capsule (20 mg total) by mouth daily.   pregabalin (LYRICA) 150 MG capsule Take 1 capsule (150 mg total) by mouth 3 (three) times daily.   Tapentadol HCl (NUCYNTA ER) 250 MG TB12 Take 250 mg by mouth 2 (two) times daily.   Tapentadol HCl (NUCYNTA) 100 MG TABS Take 100 mg by mouth 4 (four) times daily.   No current facility-administered medications for this visit. (Other)      REVIEW OF SYSTEMS:    ALLERGIES Allergies  Allergen Reactions   Morphine  And Related Nausea Only    PAST MEDICAL HISTORY Past Medical History:  Diagnosis Date   Depressive disorder, not elsewhere classified    Facet syndrome, lumbar    Hypertension    Lesion of sciatic nerve    Lumbosacral spondylosis without myelopathy    Other chronic postoperative pain    Sciatic neuritis    Spinal stenosis, lumbar region, without neurogenic claudication    Past Surgical History:  Procedure Laterality Date   KNEE SURGERY      FAMILY HISTORY Family History  Problem Relation Age of Onset   Arthritis Mother    Diabetes Brother     SOCIAL HISTORY Social History   Tobacco Use   Smoking status: Every Day    Packs/day: 1.00    Years: 45.00    Pack years: 45.00    Types: Cigarettes   Smokeless tobacco: Never  Substance Use Topics   Alcohol use: No   Drug use: No         OPHTHALMIC EXAM:  Base Eye Exam     Visual Acuity (ETDRS)       Right Left   Dist [place] 20/400 20/20   Dist ph [place] NI  Tonometry (Tonopen, 1:10 PM)       Right Left   Pressure 15 13         Pupils       Pupils Dark Light Shape React APD   Right PERRL 5 4 Round Brisk None   Left PERRL 5 4 Round Brisk None         Visual Fields (Counting fingers)       Left Right    Full Full         Extraocular Movement       Right Left    Full Full         Neuro/Psych     Oriented x3: Yes   Mood/Affect: Normal         Dilation     Both eyes: 1.0% Mydriacyl, 2.5% Phenylephrine @ 1:10 PM           Slit Lamp and Fundus Exam     External Exam       Right Left   External Normal Normal         Slit Lamp Exam       Right Left   Lids/Lashes Normal Normal   Conjunctiva/Sclera White and quiet White and quiet   Cornea Clear Clear   Anterior Chamber Deep and quiet Deep and quiet   Iris Round and reactive Round and reactive   Lens 2+ Nuclear sclerosis 2+ Nuclear sclerosis   Anterior Vitreous Normal Normal         Fundus Exam       Right  Left   Posterior Vitreous Normal Normal   Disc Normal Normal   C/D Ratio 0.3 0.3   Macula Microaneurysms, no clinical thickening Normal   Vessels Retinal vein occlusion inferotemporal to the fovea,, with much less intraretinal hemorrhage now Normal   Periphery Choroidal nevus small flat, 1.5 disc diameters, posterior pole no change Normal            IMAGING AND PROCEDURES  Imaging and Procedures for 06/25/20  OCT, Retina - OU - Both Eyes       Right Eye Quality was good. Scan locations included subfoveal. Central Foveal Thickness: 225. Progression has worsened. Findings include abnormal foveal contour.   Left Eye Quality was borderline. Scan locations included subfoveal. Central Foveal Thickness: 284. Progression has been stable. Findings include normal foveal contour.   Notes Much improved cystoid macular edema of CME has returned from BRVO,  now 8 weeks post recent Avastin injection OD repeat injection OD next in  8 weeks     Intravitreal Injection, Pharmacologic Agent - OD - Right Eye       Time Out 06/25/2020. 1:56 PM. Confirmed correct patient, procedure, site, and patient consented.   Anesthesia Topical anesthesia was used. Anesthetic medications included Akten 3.5%.   Procedure Preparation included Tobramycin 0.3%, Ofloxacin , 10% betadine to eyelids, 5% betadine to ocular surface. A 30 gauge needle was used.   Injection: 2.5 mg bevacizumab 2.5 MG/0.1ML   Route: Intravitreal   NDC: [phone], Lot: [id number]   Post-op Post injection exam found visual acuity of at least counting fingers. The patient tolerated the procedure well. There were no complications. The patient received written and verbal post procedure care education. Post injection medications were not given.              ASSESSMENT/PLAN:  Nuclear sclerotic cataract of left eye Mild and stable  Nuclear sclerotic cataract of right eye Mild and stable  Choroidal  nevus, right No interval  change over the last 8 months  Central retinal artery occlusion of right eye Resolved in the past, counts are acuity     ICD-10-CM   1. 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 Nuclear sclerotic cataract of left eye  H25.12     3. Nuclear sclerotic cataract of right eye  H25.11     4. Choroidal nevus, right  D31.31     5. Central retinal artery occlusion of right eye  H34.11       1.  OD with history of retinal artery embolus but stable over time  2.  Macular edema from prior BRVO controlled slightly increased inferotemporal the fovea longer 8-week interval yet no impact on acuity will repeat injection today and maintain 8-week dilated interval, OD    3.  Ophthalmic Meds Ordered this visit:  Meds ordered this encounter  Medications   bevacizumab (AVASTIN) SOSY 2.5 mg       Return in about 8 weeks (around 08/20/2020) for dilate, OD, AVASTIN OCT.  There are no Patient Instructions on file for this visit.   Explained the diagnoses, plan, and follow up with the patient and they expressed understanding.  Patient expressed understanding of the importance of proper follow up care.   Clent Demark Culver Feighner M.D. Diseases & Surgery of the Retina and Vitreous Retina [facility] Diabetic [place] 06/25/20     Abbreviations: M myopia (nearsighted); A astigmatism; H hyperopia (farsighted); P presbyopia; Mrx spectacle prescription;  CTL contact lenses; OD right eye; OS left eye; OU both eyes  XT exotropia; ET esotropia; PEK punctate epithelial keratitis; PEE punctate epithelial erosions; DES dry eye syndrome; MGD meibomian gland dysfunction; ATs artificial tears; PFAT's preservative free artificial tears; [place] nuclear sclerotic cataract; PSC posterior subcapsular cataract; ERM epi-retinal membrane; PVD posterior vitreous detachment;  RD retinal detachment; DM diabetes mellitus; DR diabetic retinopathy; NPDR non-proliferative diabetic retinopathy; PDR proliferative diabetic retinopathy; CSME clinically significant macular edema; DME diabetic macular edema; dbh dot blot hemorrhages; CWS cotton wool spot; POAG primary open angle glaucoma; C/D cup-to-disc ratio; HVF humphrey visual field; GVF goldmann visual field; OCT optical coherence tomography; IOP intraocular pressure; BRVO Branch retinal vein occlusion; CRVO central retinal vein occlusion; CRAO central retinal artery occlusion; BRAO branch retinal artery occlusion; RT retinal tear; SB scleral buckle; PPV pars plana vitrectomy; VH Vitreous hemorrhage; PRP panretinal laser photocoagulation; IVK intravitreal kenalog; VMT vitreomacular traction; MH Macular hole;  NVD neovascularization of the disc; NVE neovascularization elsewhere; AREDS age related eye disease study; ARMD age related macular degeneration; POAG primary open angle glaucoma; EBMD epithelial/anterior basement membrane dystrophy; ACIOL anterior chamber intraocular lens; IOL intraocular lens; PCIOL posterior chamber intraocular lens; Phaco/IOL phacoemulsification with intraocular lens placement; [place] photorefractive keratectomy; LASIK laser assisted in situ keratomileusis; HTN hypertension; DM diabetes mellitus; COPD chronic obstructive pulmonary disease

## 2020-06-25 NOTE — Assessment & Plan Note (Signed)
Mild and stable

## 2020-06-25 NOTE — Assessment & Plan Note (Signed)
Resolved in the past, counts are acuity

## 2020-08-20 ENCOUNTER — Other Ambulatory Visit: Payer: Self-pay

## 2020-08-20 ENCOUNTER — Encounter (INDEPENDENT_AMBULATORY_CARE_PROVIDER_SITE_OTHER): Payer: Self-pay | Admitting: Ophthalmology

## 2020-08-20 ENCOUNTER — Ambulatory Visit (INDEPENDENT_AMBULATORY_CARE_PROVIDER_SITE_OTHER): Payer: Medicare PPO | Admitting: Ophthalmology

## 2020-08-20 DIAGNOSIS — H34831 Tributary (branch) retinal vein occlusion, right eye, with macular edema: Secondary | ICD-10-CM

## 2020-08-20 MED ORDER — BEVACIZUMAB 2.5 MG/0.1ML IZ SOSY
2.5000 mg | PREFILLED_SYRINGE | INTRAVITREAL | Status: AC | PRN
  Administered 2020-08-20: 2.5 mg via INTRAVITREAL

## 2020-08-20 NOTE — Assessment & Plan Note (Signed)
Much less CME OD, some residual diffuse macular atrophy does suggest there is a component of arterial circulatory compromise as well.

## 2020-08-20 NOTE — Progress Notes (Signed)
08/20/2020     CHIEF COMPLAINT Patient presents for No chief complaint on file.   HISTORY OF PRESENT ILLNESS: Dylan Farley is a 65 y.o. male who presents to the clinic today for:   HPI   8 Week f/u for BRVO with CME OD.  No interval change in acuity Last edited by Hurman Horn, MD on 08/20/2020  1:55 PM.      Referring physician: Celene Squibb, MD [place],  [place] [address]  HISTORICAL INFORMATION:   Selected notes from the MEDICAL RECORD NUMBER    Lab Results  Component Value Date   HGBA1C 5.3 08/31/2017     CURRENT MEDICATIONS: No current outpatient medications on file. (Ophthalmic Drugs)   No current facility-administered medications for this visit. (Ophthalmic Drugs)   Current Outpatient Medications (Other)  Medication Sig   atorvastatin (LIPITOR) 40 MG tablet Take 1 tablet (40 mg total) by mouth daily at 6 PM.   clopidogrel (PLAVIX) 75 MG tablet Take 1 tablet (75 mg total) by mouth daily.   losartan (COZAAR) 50 MG tablet Take 50 mg by mouth daily.   omeprazole (PRILOSEC) 20 MG capsule Take 1 capsule (20 mg total) by mouth daily.   pregabalin (LYRICA) 150 MG capsule Take 1 capsule (150 mg total) by mouth 3 (three) times daily.   Tapentadol HCl (NUCYNTA ER) 250 MG TB12 Take 250 mg by mouth 2 (two) times daily.   Tapentadol HCl (NUCYNTA) 100 MG TABS Take 100 mg by mouth 4 (four) times daily.   No current facility-administered medications for this visit. (Other)      REVIEW OF SYSTEMS:    ALLERGIES Allergies  Allergen Reactions   Morphine And Related Nausea Only    PAST MEDICAL HISTORY Past Medical History:  Diagnosis Date   Depressive disorder, not elsewhere classified    Facet syndrome, lumbar    Hypertension    Lesion of sciatic nerve    Lumbosacral spondylosis without myelopathy    Other chronic postoperative pain    Sciatic neuritis    Spinal stenosis, lumbar region, without neurogenic claudication    Past Surgical  History:  Procedure Laterality Date   KNEE SURGERY      FAMILY HISTORY Family History  Problem Relation Age of Onset   Arthritis Mother    Diabetes Brother     SOCIAL HISTORY Social History   Tobacco Use   Smoking status: Every Day    Packs/day: 1.00    Years: 45.00    Pack years: 45.00    Types: Cigarettes   Smokeless tobacco: Never  Substance Use Topics   Alcohol use: No   Drug use: No         OPHTHALMIC EXAM:  Base Eye Exam     Visual Acuity (ETDRS)       Right Left   Dist [place] 20/400 20/25         Tonometry (Tonopen, 1:55 PM)       Right Left   Pressure 14 13         Pupils       Pupils React APD   Right PERRL Brisk None   Left PERRL Brisk None         Visual Fields       Left Right   Restrictions Central [place]oma          Extraocular Movement       Right Left    Full, Ortho Full,  Ortho         Neuro/Psych     Oriented x3: Yes   Mood/Affect: Normal         Dilation     Right eye: 1.0% Mydriacyl, 2.5% Phenylephrine @ 1:31 PM           Slit Lamp and Fundus Exam     External Exam       Right Left   External Normal Normal         Slit Lamp Exam       Right Left   Lids/Lashes Normal Normal   Conjunctiva/Sclera White and quiet White and quiet   Cornea Clear Clear   Anterior Chamber Deep and quiet Deep and quiet   Iris Round and reactive Round and reactive   Lens 2+ Nuclear sclerosis 2+ Nuclear sclerosis   Anterior Vitreous Normal Normal         Fundus Exam       Right Left   Posterior Vitreous Normal    Disc Normal    C/D Ratio 0.3    Macula Microaneurysms, no clinical thickening    Vessels Retinal vein occlusion inferotemporal to the fovea,, with much less intraretinal hemorrhage now    Periphery Choroidal nevus small flat, 1.5 disc diameters, posterior pole no change             IMAGING AND PROCEDURES  Imaging and Procedures for 08/20/20  OCT, Retina - OU - Both Eyes       Right  Eye Quality was good. Scan locations included subfoveal. Central Foveal Thickness: 224. Progression has worsened. Findings include abnormal foveal contour.   Left Eye Quality was borderline. Scan locations included subfoveal. Central Foveal Thickness: 281. Progression has been stable. Findings include normal foveal contour.   Notes Much improved cystoid macular edema of CME has returned from BRVO,  now 8 weeks post recent Avastin injection OD repeat injection OD next in  13 weeks     Intravitreal Injection, Pharmacologic Agent - OD - Right Eye       Time Out 08/20/2020. 1:59 PM. Confirmed correct patient, procedure, site, and patient consented.   Anesthesia Topical anesthesia was used. Anesthetic medications included Akten 3.5%.   Procedure Preparation included Tobramycin 0.3%, Ofloxacin , 10% betadine to eyelids, 5% betadine to ocular surface. A 30 gauge needle was used.   Injection: 2.5 mg bevacizumab 2.5 MG/0.1ML   Route: Intravitreal, Site: Right Eye   NDC: [phone], Lot: [id number]   Post-op Post injection exam found visual acuity of at least counting fingers. The patient tolerated the procedure well. There were no complications. The patient received written and verbal post procedure care education. Post injection medications were not given.              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     ICD-10-CM   1. 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  Diffuse macular thickening and CME has overall vastly improved and stable stable lets 8-week follow-up.  We will note that diffuse atrophy of the right macular also suggest  arterial hypoperfusion.  Repeat in injection Avastin  OD today and extend interval examination next to 3 months  2.  3.  Ophthalmic Meds Ordered this visit:  Meds ordered this encounter  Medications   bevacizumab (AVASTIN) SOSY 2.5 mg       Return in about 3 months (around 11/20/2020) for DILATE OU, AVASTIN OCT, OD, OPTOS FFA R/L, COLOR FP.  There are no Patient Instructions on file for this visit.   Explained the diagnoses, plan, and follow up with the patient and they expressed understanding.  Patient expressed understanding of the importance of proper follow up care.   Clent Demark Jerika Wales M.D. Diseases & Surgery of the Retina and Vitreous Retina & Diabetic [place] 08/20/20     Abbreviations: M myopia (nearsighted); A astigmatism; H hyperopia (farsighted); P presbyopia; Mrx spectacle prescription;  CTL contact lenses; OD right eye; OS left eye; OU both eyes  XT exotropia; ET esotropia; PEK punctate epithelial keratitis; PEE punctate epithelial erosions; DES dry eye syndrome; MGD meibomian gland dysfunction; ATs artificial tears; PFAT's preservative free artificial tears; [place] nuclear sclerotic cataract; PSC posterior subcapsular cataract; ERM epi-retinal membrane; PVD posterior vitreous detachment; RD retinal detachment; DM diabetes mellitus; DR diabetic retinopathy; NPDR non-proliferative diabetic retinopathy; PDR proliferative diabetic retinopathy; CSME clinically significant macular edema; DME diabetic macular edema; dbh dot blot hemorrhages; CWS cotton wool spot; POAG primary open angle glaucoma; C/D cup-to-disc ratio; HVF humphrey visual field; GVF goldmann visual field; OCT optical coherence tomography; IOP intraocular pressure; BRVO Branch retinal vein occlusion; CRVO central retinal vein occlusion; CRAO central retinal artery occlusion; BRAO branch retinal artery occlusion; RT retinal tear; SB scleral buckle; PPV pars plana vitrectomy; VH Vitreous hemorrhage; PRP panretinal laser  photocoagulation; IVK intravitreal kenalog; VMT vitreomacular traction; MH Macular hole;  NVD neovascularization of the disc; NVE neovascularization elsewhere; AREDS age related eye disease study; ARMD age related macular degeneration; POAG primary open angle glaucoma; EBMD epithelial/anterior basement membrane dystrophy; ACIOL anterior chamber intraocular lens; IOL intraocular lens; PCIOL posterior chamber intraocular lens; Phaco/IOL phacoemulsification with intraocular lens placement; [place] photorefractive keratectomy; LASIK laser assisted in situ keratomileusis; HTN hypertension; DM diabetes mellitus; COPD chronic obstructive pulmonary disease

## 2020-10-09 DIAGNOSIS — K219 Gastro-esophageal reflux disease without esophagitis: Secondary | ICD-10-CM | POA: Diagnosis not present

## 2020-10-09 DIAGNOSIS — Z125 Encounter for screening for malignant neoplasm of prostate: Secondary | ICD-10-CM | POA: Diagnosis not present

## 2020-10-09 DIAGNOSIS — Z23 Encounter for immunization: Secondary | ICD-10-CM | POA: Diagnosis not present

## 2020-10-09 DIAGNOSIS — G894 Chronic pain syndrome: Secondary | ICD-10-CM | POA: Diagnosis not present

## 2020-10-09 DIAGNOSIS — I10 Essential (primary) hypertension: Secondary | ICD-10-CM | POA: Diagnosis not present

## 2020-10-09 DIAGNOSIS — H3530 Unspecified macular degeneration: Secondary | ICD-10-CM | POA: Diagnosis not present

## 2020-10-09 DIAGNOSIS — Z79891 Long term (current) use of opiate analgesic: Secondary | ICD-10-CM | POA: Diagnosis not present

## 2020-11-06 DIAGNOSIS — I10 Essential (primary) hypertension: Secondary | ICD-10-CM | POA: Diagnosis not present

## 2020-11-12 DIAGNOSIS — I10 Essential (primary) hypertension: Secondary | ICD-10-CM | POA: Diagnosis not present

## 2020-11-12 DIAGNOSIS — K219 Gastro-esophageal reflux disease without esophagitis: Secondary | ICD-10-CM | POA: Diagnosis not present

## 2020-11-20 ENCOUNTER — Encounter (INDEPENDENT_AMBULATORY_CARE_PROVIDER_SITE_OTHER): Payer: Self-pay | Admitting: Ophthalmology

## 2020-11-20 ENCOUNTER — Ambulatory Visit (INDEPENDENT_AMBULATORY_CARE_PROVIDER_SITE_OTHER): Payer: Medicare PPO | Admitting: Ophthalmology

## 2020-11-20 ENCOUNTER — Other Ambulatory Visit: Payer: Self-pay

## 2020-11-20 DIAGNOSIS — D3131 Benign neoplasm of right choroid: Secondary | ICD-10-CM | POA: Diagnosis not present

## 2020-11-20 DIAGNOSIS — H34831 Tributary (branch) retinal vein occlusion, right eye, with macular edema: Secondary | ICD-10-CM

## 2020-11-20 DIAGNOSIS — H2511 Age-related nuclear cataract, right eye: Secondary | ICD-10-CM | POA: Diagnosis not present

## 2020-11-20 DIAGNOSIS — H2512 Age-related nuclear cataract, left eye: Secondary | ICD-10-CM

## 2020-11-20 DIAGNOSIS — H2513 Age-related nuclear cataract, bilateral: Secondary | ICD-10-CM

## 2020-11-20 MED ORDER — BEVACIZUMAB 2.5 MG/0.1ML IZ SOSY
2.5000 mg | PREFILLED_SYRINGE | INTRAVITREAL | Status: AC | PRN
  Administered 2020-11-20: 2.5 mg via INTRAVITREAL

## 2020-11-20 NOTE — Assessment & Plan Note (Signed)
No interval change in appearance, no high risk features

## 2020-11-20 NOTE — Assessment & Plan Note (Signed)
Dense cataract, likely progressing to the point where he now needs to consider cataract extraction intraocular displacement in each eye

## 2020-11-20 NOTE — Progress Notes (Signed)
11/20/2020     CHIEF COMPLAINT Patient presents for No chief complaint on file.  Epic [employer] prevents chief complaint from appearing  Patient has a history of branch retinal Vein occlusion with CME in the past OD now 100-month follow-up   HISTORY OF PRESENT ILLNESS: Dylan Farley is a 65 y.o. male who presents to the clinic today for:   HPI   History of branch retinal vein occlusion but accompanied by partial retinal artery occlusion in the past, now follow-up.  Of 3 month f/u OU with OCT and possible Avastin injection OD Last edited by Hurman Horn, MD on 11/20/2020  2:42 PM.      Referring physician: Celene Squibb, MD [place],  [place] [address]  HISTORICAL INFORMATION:   Selected notes from the MEDICAL RECORD NUMBER    Lab Results  Component Value Date   HGBA1C 5.3 08/31/2017     CURRENT MEDICATIONS: No current outpatient medications on file. (Ophthalmic Drugs)   No current facility-administered medications for this visit. (Ophthalmic Drugs)   Current Outpatient Medications (Other)  Medication Sig   atorvastatin (LIPITOR) 40 MG tablet Take 1 tablet (40 mg total) by mouth daily at 6 PM.   clopidogrel (PLAVIX) 75 MG tablet Take 1 tablet (75 mg total) by mouth daily.   losartan (COZAAR) 50 MG tablet Take 50 mg by mouth daily.   omeprazole (PRILOSEC) 20 MG capsule Take 1 capsule (20 mg total) by mouth daily.   pregabalin (LYRICA) 150 MG capsule Take 1 capsule (150 mg total) by mouth 3 (three) times daily.   Tapentadol HCl (NUCYNTA ER) 250 MG TB12 Take 250 mg by mouth 2 (two) times daily.   Tapentadol HCl (NUCYNTA) 100 MG TABS Take 100 mg by mouth 4 (four) times daily.   No current facility-administered medications for this visit. (Other)      REVIEW OF SYSTEMS:    ALLERGIES Allergies  Allergen Reactions   Morphine And Related Nausea Only    PAST MEDICAL HISTORY Past Medical History:  Diagnosis Date   Depressive disorder, not  elsewhere classified    Facet syndrome, lumbar    Hypertension    Lesion of sciatic nerve    Lumbosacral spondylosis without myelopathy    Other chronic postoperative pain    Sciatic neuritis    Spinal stenosis, lumbar region, without neurogenic claudication    Past Surgical History:  Procedure Laterality Date   KNEE SURGERY      FAMILY HISTORY Family History  Problem Relation Age of Onset   Arthritis Mother    Diabetes Brother     SOCIAL HISTORY Social History   Tobacco Use   Smoking status: Every Day    Packs/day: 1.00    Years: 45.00    Pack years: 45.00    Types: Cigarettes   Smokeless tobacco: Never  Substance Use Topics   Alcohol use: No   Drug use: No         OPHTHALMIC EXAM:  Base Eye Exam     Visual Acuity (ETDRS)       Right Left   Dist [place] 20/400 -1 20/25   Dist ph  NI          Tonometry (Tonopen, 1:53 PM)       Right Left   Pressure 13 10         Pupils       Pupils Dark Light Shape React APD   Right PERRL  5 4 Round Brisk None   Left PERRL 5 4 Round Brisk None         Visual Fields (Counting fingers)       Left Right    Full Full         Extraocular Movement       Right Left    Full, Ortho Full, Ortho         Neuro/Psych     Oriented x3: Yes   Mood/Affect: Normal         Dilation     Both eyes: 1.0% Mydriacyl, 2.5% Phenylephrine @ 1:52 PM           Slit Lamp and Fundus Exam     External Exam       Right Left   External Normal Normal         Slit Lamp Exam       Right Left   Lids/Lashes Normal Normal   Conjunctiva/Sclera White and quiet White and quiet   Cornea Clear Clear   Anterior Chamber Deep and quiet Deep and quiet   Iris Round and reactive Round and reactive   Lens 2+ Nuclear sclerosis 2+ Nuclear sclerosis   Anterior Vitreous Normal Normal         Fundus Exam       Right Left   Posterior Vitreous Normal Normal   Disc Normal Normal   C/D Ratio 0.3 0.3   Macula  Microaneurysms, no clinical thickening Normal   Vessels Retinal vein occlusion inferotemporal to the fovea,, with much less intraretinal hemorrhage now,  Normal   Periphery Choroidal nevus small flat, 1.5 disc diameters, posterior pole no change, no high risk features, no fluid no atrophy no lipofuscin Normal            IMAGING AND PROCEDURES  Imaging and Procedures for 11/20/20  OCT, Retina - OU - Both Eyes       Right Eye Quality was good. Scan locations included subfoveal. Central Foveal Thickness: 327. Progression has worsened. Findings include abnormal foveal contour, vitreomacular adhesion , cystoid macular edema.   Left Eye Quality was borderline. Scan locations included subfoveal. Central Foveal Thickness: 280. Progression has been stable. Findings include normal foveal contour.   Notes Today at 62-month follow-up interval OD with CME now recurrence.  From BRVO.  We will repeat injection OD today and maintain follow-up examination next in 6 weeks     Intravitreal Injection, Pharmacologic Agent - OD - Right Eye       Time Out 11/20/2020. 2:45 PM. Confirmed correct patient, procedure, site, and patient consented.   Anesthesia Topical anesthesia was used. Anesthetic medications included Lidocaine 4%.   Procedure Preparation included Tobramycin 0.3%, Ofloxacin , 10% betadine to eyelids, 5% betadine to ocular surface. A 30 gauge needle was used.   Injection: 2.5 mg bevacizumab 2.5 MG/0.1ML   Route: Intravitreal, Site: Right Eye   NDC: [phone], Lot: [id number]   Post-op Post injection exam found visual acuity of at least counting fingers. The patient tolerated the procedure well. There were no complications. The patient received written and verbal post procedure care education. Post injection medications included ocuflox.              ASSESSMENT/PLAN:  Nuclear sclerotic cataract of right eye Dense cataract, likely progressing to the point where he now needs  to consider cataract extraction intraocular displacement in each eye  Nuclear sclerotic cataract of left eye See comments under the right  Choroidal nevus,  right No interval change in appearance, no high risk features  Branch retinal vein occlusion with macular edema of right eye CME OD from BRVO component of disease has recurred at 13 weeks (3 months) follow-up interval thus we will shorten the interval of follow-up next to 6 weeks after injection of Avastin today     ICD-10-CM   1. 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 Nuclear sclerotic cataract of right eye  H25.11     3. Nuclear sclerotic cataract of left eye  H25.12     4. Choroidal nevus, right  D31.31       1.  2.  3.  Ophthalmic Meds Ordered this visit:  Meds ordered this encounter  Medications   bevacizumab (AVASTIN) SOSY 2.5 mg       Return in about 6 weeks (around 01/01/2021) for DILATE OU, AVASTIN OCT, OD.  There are no Patient Instructions on file for this visit.   Explained the diagnoses, plan, and follow up with the patient and they expressed understanding.  Patient expressed understanding of the importance of proper follow up care.   Clent Demark Aleyda Gindlesperger M.D. Diseases & Surgery of the Retina and Vitreous Retina & Diabetic [place] 11/20/20     Abbreviations: M myopia (nearsighted); A astigmatism; H hyperopia (farsighted); P presbyopia; Mrx spectacle prescription;  CTL contact lenses; OD right eye; OS left eye; OU both eyes  XT exotropia; ET esotropia; PEK punctate epithelial keratitis; PEE punctate epithelial erosions; DES dry eye syndrome; MGD meibomian gland dysfunction; ATs artificial tears; PFAT's preservative free artificial tears; [place] nuclear sclerotic cataract; PSC posterior subcapsular cataract; ERM epi-retinal membrane; PVD  posterior vitreous detachment; RD retinal detachment; DM diabetes mellitus; DR diabetic retinopathy; NPDR non-proliferative diabetic retinopathy; PDR proliferative diabetic retinopathy; CSME clinically significant macular edema; DME diabetic macular edema; dbh dot blot hemorrhages; CWS cotton wool spot; POAG primary open angle glaucoma; C/D cup-to-disc ratio; HVF humphrey visual field; GVF goldmann visual field; OCT optical coherence tomography; IOP intraocular pressure; BRVO Branch retinal vein occlusion; CRVO central retinal vein occlusion; CRAO central retinal artery occlusion; BRAO branch retinal artery occlusion; RT retinal tear; SB scleral buckle; PPV pars plana vitrectomy; VH Vitreous hemorrhage; PRP panretinal laser photocoagulation; IVK intravitreal kenalog; VMT vitreomacular traction; MH Macular hole;  NVD neovascularization of the disc; NVE neovascularization elsewhere; AREDS age related eye disease study; ARMD age related macular degeneration; POAG primary open angle glaucoma; EBMD epithelial/anterior basement membrane dystrophy; ACIOL anterior chamber intraocular lens; IOL intraocular lens; PCIOL posterior chamber intraocular lens; Phaco/IOL phacoemulsification with intraocular lens placement; [place] photorefractive keratectomy; LASIK laser assisted in situ keratomileusis; HTN hypertension; DM diabetes mellitus; COPD chronic obstructive pulmonary disease

## 2020-11-20 NOTE — Assessment & Plan Note (Signed)
CME OD from BRVO component of disease has recurred at 13 weeks (3 months) follow-up interval thus we will shorten the interval of follow-up next to 6 weeks after injection of Avastin today

## 2020-11-20 NOTE — Assessment & Plan Note (Signed)
See comments under the right

## 2020-11-21 DIAGNOSIS — I10 Essential (primary) hypertension: Secondary | ICD-10-CM | POA: Diagnosis not present

## 2020-11-21 DIAGNOSIS — D6949 Other primary thrombocytopenia: Secondary | ICD-10-CM | POA: Diagnosis not present

## 2020-11-21 DIAGNOSIS — E875 Hyperkalemia: Secondary | ICD-10-CM | POA: Diagnosis not present

## 2020-11-21 DIAGNOSIS — Z8673 Personal history of transient ischemic attack (TIA), and cerebral infarction without residual deficits: Secondary | ICD-10-CM | POA: Diagnosis not present

## 2020-11-21 DIAGNOSIS — R7301 Impaired fasting glucose: Secondary | ICD-10-CM | POA: Diagnosis not present

## 2020-11-21 DIAGNOSIS — M5441 Lumbago with sciatica, right side: Secondary | ICD-10-CM | POA: Diagnosis not present

## 2020-11-21 DIAGNOSIS — Z0001 Encounter for general adult medical examination with abnormal findings: Secondary | ICD-10-CM | POA: Diagnosis not present

## 2020-11-21 DIAGNOSIS — M5442 Lumbago with sciatica, left side: Secondary | ICD-10-CM | POA: Diagnosis not present

## 2020-11-21 DIAGNOSIS — D649 Anemia, unspecified: Secondary | ICD-10-CM | POA: Diagnosis not present

## 2021-01-01 ENCOUNTER — Encounter (INDEPENDENT_AMBULATORY_CARE_PROVIDER_SITE_OTHER): Payer: Medicare PPO | Admitting: Ophthalmology

## 2021-01-01 ENCOUNTER — Other Ambulatory Visit: Payer: Self-pay

## 2021-01-01 ENCOUNTER — Encounter (INDEPENDENT_AMBULATORY_CARE_PROVIDER_SITE_OTHER): Payer: Self-pay | Admitting: Ophthalmology

## 2021-01-01 ENCOUNTER — Ambulatory Visit (INDEPENDENT_AMBULATORY_CARE_PROVIDER_SITE_OTHER): Payer: Medicare PPO | Admitting: Ophthalmology

## 2021-01-01 DIAGNOSIS — H2513 Age-related nuclear cataract, bilateral: Secondary | ICD-10-CM

## 2021-01-01 DIAGNOSIS — D3131 Benign neoplasm of right choroid: Secondary | ICD-10-CM | POA: Diagnosis not present

## 2021-01-01 DIAGNOSIS — H2512 Age-related nuclear cataract, left eye: Secondary | ICD-10-CM | POA: Diagnosis not present

## 2021-01-01 DIAGNOSIS — H34831 Tributary (branch) retinal vein occlusion, right eye, with macular edema: Secondary | ICD-10-CM

## 2021-01-01 DIAGNOSIS — H2511 Age-related nuclear cataract, right eye: Secondary | ICD-10-CM

## 2021-01-01 MED ORDER — BEVACIZUMAB 2.5 MG/0.1ML IZ SOSY
2.5000 mg | PREFILLED_SYRINGE | INTRAVITREAL | Status: AC | PRN
  Administered 2021-01-01: 11:00:00 2.5 mg via INTRAVITREAL

## 2021-01-01 NOTE — Assessment & Plan Note (Signed)
Progressive OS

## 2021-01-01 NOTE — Assessment & Plan Note (Signed)
The nature of branch retinal vein occlusion with macular edema was discussed.  The patient was given access to printed information.  The treatment options including continued observation looking for spontaneous resolution versus grid laser versus intravitreal Kenalog injection were discussed.  PRIMARY THERAPY CONSISTS of Anti-VEGF Therapies, AVASTIN, LUCENTIS AND EYLEA.  Their usage was discussed to assist in halting the progression of Macular Edema, in order to preserve, protect or improve acuity.  Additionally, at times, limited focal laser therapy is used in the management.  The risks and benefits of all these options were discussed with the patient.  The patient's questions were answered.  At 6 weeks, BRVO CME has improved.  Review acuity has stabilized

## 2021-01-01 NOTE — Assessment & Plan Note (Signed)
Progressive OD

## 2021-01-01 NOTE — Progress Notes (Signed)
01/01/2021     CHIEF COMPLAINT Patient presents for  Chief Complaint  Patient presents with   Retina Evaluation      HISTORY OF PRESENT ILLNESS: Dylan Farley is a 65 y.o. male who presents to the clinic today for:   HPI   Follow-up interval today of 6 weeks status post injection intravitreal Avastin for macular branch retinal vein occlusion with impact on acuity.  No interval change in medical history no interval change in acuity. Last edited by Hurman Horn, MD on 01/01/2021 10:20 AM.      Referring physician: Celene Squibb, MD [place],  [place] [address]  HISTORICAL INFORMATION:   Selected notes from the MEDICAL RECORD NUMBER    Lab Results  Component Value Date   HGBA1C 5.3 08/31/2017     CURRENT MEDICATIONS: No current outpatient medications on file. (Ophthalmic Drugs)   No current facility-administered medications for this visit. (Ophthalmic Drugs)   Current Outpatient Medications (Other)  Medication Sig   atorvastatin (LIPITOR) 40 MG tablet Take 1 tablet (40 mg total) by mouth daily at 6 PM.   clopidogrel (PLAVIX) 75 MG tablet Take 1 tablet (75 mg total) by mouth daily.   losartan (COZAAR) 50 MG tablet Take 50 mg by mouth daily.   omeprazole (PRILOSEC) 20 MG capsule Take 1 capsule (20 mg total) by mouth daily.   pregabalin (LYRICA) 150 MG capsule Take 1 capsule (150 mg total) by mouth 3 (three) times daily.   Tapentadol HCl (NUCYNTA ER) 250 MG TB12 Take 250 mg by mouth 2 (two) times daily.   Tapentadol HCl (NUCYNTA) 100 MG TABS Take 100 mg by mouth 4 (four) times daily.   No current facility-administered medications for this visit. (Other)      REVIEW OF SYSTEMS: ROS   Negative for: Constitutional, Gastrointestinal, Neurological, Skin, Genitourinary, Musculoskeletal, HENT, Endocrine, Cardiovascular, Eyes, Respiratory, Psychiatric, Allergic/Imm, Heme/Lymph Last edited by Hurman Horn, MD on 01/01/2021 10:18 AM.        ALLERGIES Allergies  Allergen Reactions   Morphine And Related Nausea Only    PAST MEDICAL HISTORY Past Medical History:  Diagnosis Date   Depressive disorder, not elsewhere classified    Facet syndrome, lumbar    Hypertension    Lesion of sciatic nerve    Lumbosacral spondylosis without myelopathy    Other chronic postoperative pain    Sciatic neuritis    Spinal stenosis, lumbar region, without neurogenic claudication    Past Surgical History:  Procedure Laterality Date   KNEE SURGERY      FAMILY HISTORY Family History  Problem Relation Age of Onset   Arthritis Mother    Diabetes Brother     SOCIAL HISTORY Social History   Tobacco Use   Smoking status: Every Day    Packs/day: 1.00    Years: 45.00    Pack years: 45.00    Types: Cigarettes   Smokeless tobacco: Never  Substance Use Topics   Alcohol use: No   Drug use: No         OPHTHALMIC EXAM:  Base Eye Exam     Visual Acuity (ETDRS)       Right Left   Dist [place] 20/400 20/20 -1         Tonometry (Tonopen, 10:18 AM)       Right Left   Pressure 15 13         Neuro/Psych     Oriented x3: Yes  Mood/Affect: Normal         Dilation     Both eyes: 1.0% Mydriacyl, 2.5% Phenylephrine @ 10:18 AM           Slit Lamp and Fundus Exam     External Exam       Right Left   External Normal Normal         Slit Lamp Exam       Right Left   Lids/Lashes Normal Normal   Conjunctiva/Sclera White and quiet White and quiet   Cornea Clear Clear   Anterior Chamber Deep and quiet Deep and quiet   Iris Round and reactive Round and reactive   Lens 2+ Nuclear sclerosis 2+ Nuclear sclerosis   Anterior Vitreous Normal Normal         Fundus Exam       Right Left   Posterior Vitreous Normal Normal   Disc Normal Normal   C/D Ratio 0.3 0.3   Macula Microaneurysms, no clinical thickening Normal   Vessels Retinal vein occlusion inferotemporal to the fovea,, with much less  intraretinal hemorrhage now,  Normal   Periphery Choroidal nevus small flat, 1.5 disc diameters, posterior pole no change, no high risk features, no fluid no atrophy no lipofuscin Normal            IMAGING AND PROCEDURES  Imaging and Procedures for 01/01/21  OCT, Retina - OU - Both Eyes       Right Eye Quality was good. Scan locations included subfoveal. Central Foveal Thickness: 228. Progression has improved. Findings include abnormal foveal contour, vitreomacular adhesion , cystoid macular edema.   Left Eye Quality was borderline. Scan locations included subfoveal. Central Foveal Thickness: 281. Progression has been stable. Findings include normal foveal contour.   Notes Today at 6-week follow-up interval OD with CME now improvement from BRVO.  We will repeat injection OD today and maintain follow-up examination next in 6 weeks     Intravitreal Injection, Pharmacologic Agent - OD - Right Eye       Time Out 01/01/2021. 10:31 AM. Confirmed correct patient, procedure, site, and patient consented.   Anesthesia Topical anesthesia was used. Anesthetic medications included Lidocaine 4%.   Procedure Preparation included Tobramycin 0.3%, Ofloxacin , 10% betadine to eyelids, 5% betadine to ocular surface. A 30 gauge needle was used.   Injection: 2.5 mg bevacizumab 2.5 MG/0.1ML   Route: Intravitreal, Site: Right Eye   NDC: [phone], Lot: [id number]   Post-op Post injection exam found visual acuity of at least counting fingers. The patient tolerated the procedure well. There were no complications. The patient received written and verbal post procedure care education. Post injection medications included ocuflox.              ASSESSMENT/PLAN:  Branch retinal vein occlusion with macular edema of right eye The nature of branch retinal vein occlusion with macular edema was discussed.  The patient was given access to printed information.  The treatment options including  continued observation looking for spontaneous resolution versus grid laser versus intravitreal Kenalog injection were discussed.  PRIMARY THERAPY CONSISTS of Anti-VEGF Therapies, AVASTIN, LUCENTIS AND EYLEA.  Their usage was discussed to assist in halting the progression of Macular Edema, in order to preserve, protect or improve acuity.  Additionally, at times, limited focal laser therapy is used in the management.  The risks and benefits of all these options were discussed with the patient.  The patient's questions were answered.  At 6 weeks, BRVO CME has  improved.  Review acuity has stabilized  Nuclear sclerotic cataract of left eye Progressive OS  Nuclear sclerotic cataract of right eye Progressive OD  Choroidal nevus, right Stable OD     ICD-10-CM   1. 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 Nuclear sclerotic cataract of left eye  H25.12     3. Nuclear sclerotic cataract of right eye  H25.11     4. Choroidal nevus, right  D31.31       1.  OD CME from BRVO vastly improved at 6-week and was compared to 3 months prior interval of exam.  Repeat intravitreal Avastin today and maintain  2.  Bilateral cataract   3.  Ophthalmic Meds Ordered this visit:  Meds ordered this encounter  Medications   bevacizumab (AVASTIN) SOSY 2.5 mg       Return in about 6 weeks (around 02/12/2021) for dilate, OD, AVASTIN OCT.  There are no Patient Instructions on file for this visit.   Explained the diagnoses, plan, and follow up with the patient and they expressed understanding.  Patient expressed understanding of the importance of proper follow up care.   Clent Demark Tereka Thorley M.D. Diseases & Surgery of the Retina and Vitreous Retina & Diabetic [place] 01/01/21     Abbreviations: M myopia (nearsighted); A astigmatism; H  hyperopia (farsighted); P presbyopia; Mrx spectacle prescription;  CTL contact lenses; OD right eye; OS left eye; OU both eyes  XT exotropia; ET esotropia; PEK punctate epithelial keratitis; PEE punctate epithelial erosions; DES dry eye syndrome; MGD meibomian gland dysfunction; ATs artificial tears; PFAT's preservative free artificial tears; [place] nuclear sclerotic cataract; PSC posterior subcapsular cataract; ERM epi-retinal membrane; PVD posterior vitreous detachment; RD retinal detachment; DM diabetes mellitus; DR diabetic retinopathy; NPDR non-proliferative diabetic retinopathy; PDR proliferative diabetic retinopathy; CSME clinically significant macular edema; DME diabetic macular edema; dbh dot blot hemorrhages; CWS cotton wool spot; POAG primary open angle glaucoma; C/D cup-to-disc ratio; HVF humphrey visual field; GVF goldmann visual field; OCT optical coherence tomography; IOP intraocular pressure; BRVO Branch retinal vein occlusion; CRVO central retinal vein occlusion; CRAO central retinal artery occlusion; BRAO branch retinal artery occlusion; RT retinal tear; SB scleral buckle; PPV pars plana vitrectomy; VH Vitreous hemorrhage; PRP panretinal laser photocoagulation; IVK intravitreal kenalog; VMT vitreomacular traction; MH Macular hole;  NVD neovascularization of the disc; NVE neovascularization elsewhere; AREDS age related eye disease study; ARMD age related macular degeneration; POAG primary open angle glaucoma; EBMD epithelial/anterior basement membrane dystrophy; ACIOL anterior chamber intraocular lens; IOL intraocular lens; PCIOL posterior chamber intraocular lens; Phaco/IOL phacoemulsification with intraocular lens placement; [place] photorefractive keratectomy; LASIK laser assisted in situ keratomileusis; HTN hypertension; DM diabetes mellitus; COPD chronic obstructive pulmonary disease

## 2021-01-01 NOTE — Assessment & Plan Note (Signed)
Stable OD °

## 2021-02-06 DIAGNOSIS — H524 Presbyopia: Secondary | ICD-10-CM | POA: Diagnosis not present

## 2021-02-06 DIAGNOSIS — H3582 Retinal ischemia: Secondary | ICD-10-CM | POA: Diagnosis not present

## 2021-02-06 DIAGNOSIS — H02834 Dermatochalasis of left upper eyelid: Secondary | ICD-10-CM | POA: Diagnosis not present

## 2021-02-06 DIAGNOSIS — H52223 Regular astigmatism, bilateral: Secondary | ICD-10-CM | POA: Diagnosis not present

## 2021-02-06 DIAGNOSIS — H5203 Hypermetropia, bilateral: Secondary | ICD-10-CM | POA: Diagnosis not present

## 2021-02-06 DIAGNOSIS — H2513 Age-related nuclear cataract, bilateral: Secondary | ICD-10-CM | POA: Diagnosis not present

## 2021-02-06 DIAGNOSIS — H02831 Dermatochalasis of right upper eyelid: Secondary | ICD-10-CM | POA: Diagnosis not present

## 2021-02-12 ENCOUNTER — Encounter (INDEPENDENT_AMBULATORY_CARE_PROVIDER_SITE_OTHER): Payer: Medicare PPO | Admitting: Ophthalmology

## 2021-02-19 ENCOUNTER — Encounter (INDEPENDENT_AMBULATORY_CARE_PROVIDER_SITE_OTHER): Payer: Self-pay | Admitting: Ophthalmology

## 2021-02-19 ENCOUNTER — Ambulatory Visit (INDEPENDENT_AMBULATORY_CARE_PROVIDER_SITE_OTHER): Payer: Medicare PPO | Admitting: Ophthalmology

## 2021-02-19 ENCOUNTER — Other Ambulatory Visit: Payer: Self-pay

## 2021-02-19 DIAGNOSIS — H34831 Tributary (branch) retinal vein occlusion, right eye, with macular edema: Secondary | ICD-10-CM | POA: Diagnosis not present

## 2021-02-19 DIAGNOSIS — H3411 Central retinal artery occlusion, right eye: Secondary | ICD-10-CM

## 2021-02-19 MED ORDER — BEVACIZUMAB 2.5 MG/0.1ML IZ SOSY
2.5000 mg | PREFILLED_SYRINGE | INTRAVITREAL | Status: AC | PRN
  Administered 2021-02-19: 2.5 mg via INTRAVITREAL

## 2021-02-19 NOTE — Assessment & Plan Note (Signed)
Much improved since recurrence of thickening detected November 2022, then at 37-month interval.

## 2021-02-19 NOTE — Progress Notes (Signed)
02/19/2021     CHIEF COMPLAINT Patient presents for  Chief Complaint  Patient presents with   Retina Follow Up      HISTORY OF PRESENT ILLNESS: Dylan Farley is a 66 y.o. male who presents to the clinic today for:   HPI     Retina Follow Up           Onset: 7 weeks ago   Severity: mild   Duration: 7 weeks   Course: stable         Comments   6 weeks dilate Avastin OD, oct. Patient states vision is stable and unchanged since last visit. Denies any new floaters or FOL.       Last edited by Laurin Coder on 02/19/2021 10:47 AM.      Referring physician: Celene Squibb, MD [place],  [place] [id number]  HISTORICAL INFORMATION:   Selected notes from the MEDICAL RECORD NUMBER    Lab Results  Component Value Date   HGBA1C 5.3 08/31/2017     CURRENT MEDICATIONS: No current outpatient medications on file. (Ophthalmic Drugs)   No current facility-administered medications for this visit. (Ophthalmic Drugs)   Current Outpatient Medications (Other)  Medication Sig   atorvastatin (LIPITOR) 40 MG tablet Take 1 tablet (40 mg total) by mouth daily at 6 PM.   clopidogrel (PLAVIX) 75 MG tablet Take 1 tablet (75 mg total) by mouth daily.   losartan (COZAAR) 50 MG tablet Take 50 mg by mouth daily.   omeprazole (PRILOSEC) 20 MG capsule Take 1 capsule (20 mg total) by mouth daily.   pregabalin (LYRICA) 150 MG capsule Take 1 capsule (150 mg total) by mouth 3 (three) times daily.   Tapentadol HCl (NUCYNTA ER) 250 MG TB12 Take 250 mg by mouth 2 (two) times daily.   Tapentadol HCl (NUCYNTA) 100 MG TABS Take 100 mg by mouth 4 (four) times daily.   No current facility-administered medications for this visit. (Other)      REVIEW OF SYSTEMS: ROS   Negative for: Constitutional, Gastrointestinal, Neurological, Skin, Genitourinary, Musculoskeletal, HENT, Endocrine, Cardiovascular, Eyes, Respiratory, Psychiatric, Allergic/Imm, Heme/Lymph Last edited by Hurman Horn, MD on 02/19/2021 11:17 AM.       ALLERGIES Allergies  Allergen Reactions   Morphine And Related Nausea Only    PAST MEDICAL HISTORY Past Medical History:  Diagnosis Date   Depressive disorder, not elsewhere classified    Facet syndrome, lumbar    Hypertension    Lesion of sciatic nerve    Lumbosacral spondylosis without myelopathy    Other chronic postoperative pain    Sciatic neuritis    Spinal stenosis, lumbar region, without neurogenic claudication    Past Surgical History:  Procedure Laterality Date   KNEE SURGERY      FAMILY HISTORY Family History  Problem Relation Age of Onset   Arthritis Mother    Diabetes Brother     SOCIAL HISTORY Social History   Tobacco Use   Smoking status: Every Day    Packs/day: 1.00    Years: 45.00    Pack years: 45.00    Types: Cigarettes   Smokeless tobacco: Never  Substance Use Topics   Alcohol use: No   Drug use: No         OPHTHALMIC EXAM:  Base Eye Exam     Visual Acuity (ETDRS)       Right Left   Dist [place] 20/400 20/25   Dist ph   NI          Tonometry (Tonopen, 10:51 AM)       Right Left   Pressure 13 8         Pupils       Pupils Dark Light APD   Right PERRL 5 4 None   Left PERRL 5 4 None         Extraocular Movement       Right Left    Full Full         Neuro/Psych     Oriented x3: Yes   Mood/Affect: Normal         Dilation     Right eye: 1.0% Mydriacyl, 2.5% Phenylephrine @ 10:51 AM           Slit Lamp and Fundus Exam     External Exam       Right Left   External Normal Normal         Slit Lamp Exam       Right Left   Lids/Lashes Normal Normal   Conjunctiva/Sclera White and quiet White and quiet   Cornea Clear Clear   Anterior Chamber Deep and quiet Deep and quiet   Iris Round and reactive Round and reactive   Lens 2+ Nuclear sclerosis 2+ Nuclear sclerosis   Anterior Vitreous Normal Normal         Fundus Exam       Right Left    Posterior Vitreous Normal    Disc Normal    C/D Ratio 0.3    Macula Microaneurysms, no clinical thickening    Vessels Retinal vein occlusion inferotemporal to the fovea,, with much less intraretinal hemorrhage now,     Periphery Choroidal nevus small flat, 1.5 disc diameters, posterior pole no change, no high risk features, no fluid no atrophy no lipofuscin             IMAGING AND PROCEDURES  Imaging and Procedures for 02/19/21  Intravitreal Injection, Pharmacologic Agent - OD - Right Eye       Time Out 02/19/2021. 11:18 AM. Confirmed correct patient, procedure, site, and patient consented.   Anesthesia Topical anesthesia was used. Anesthetic medications included Lidocaine 4%.   Procedure Preparation included Tobramycin 0.3%, Ofloxacin , 10% betadine to eyelids, 5% betadine to ocular surface. A 30 gauge needle was used.   Injection: 2.5 mg bevacizumab 2.5 MG/0.1ML   Route: Intravitreal, Site: Right Eye   NDC: [phone], Lot: [id number]   Post-op Post injection exam found visual acuity of at least counting fingers. The patient tolerated the procedure well. There were no complications. The patient received written and verbal post procedure care education. Post injection medications included ocuflox.      OCT, Retina - OU - Both Eyes       Right Eye Quality was good. Scan locations included subfoveal. Central Foveal Thickness: 228. Progression has improved. Findings include abnormal foveal contour, vitreomacular adhesion , cystoid macular edema.   Left Eye Quality was borderline. Scan locations included subfoveal. Central Foveal Thickness: 282. Progression has been stable. Findings include normal foveal contour.   Notes Today at 7 week follow-up interval OD with CME now improvement from BRVO.  We will repeat injection OD today and maintain follow-up examination next in 8 weeks             ASSESSMENT/PLAN:  Central retinal artery occlusion of right eye Component  of previous BRVO, he accounts for acuity  Branch retinal vein occlusion with macular  edema of right eye Much improved since recurrence of thickening detected November 2022, then at 6-month interval.     ICD-10-CM   1. Branch retinal vein occlusion with macular edema of right eye  H34.8310 Intravitreal Injection, Pharmacologic Agent - OD - Right Eye    OCT, Retina - OU - Both Eyes    bevacizumab (AVASTIN) SOSY 2.5 mg    2. Central retinal artery occlusion of right eye  H34.11       1.  OD macular anatomy continues to improve at shorter interval follow-up of 6 weeks and now 7 weeks post Avastin.  We will repeat injection today OD at 7 weeks and maintain current interval follow-up at 7 weeks so as to prevent progression of vision loss  2.  3.  Ophthalmic Meds Ordered this visit:  Meds ordered this encounter  Medications   bevacizumab (AVASTIN) SOSY 2.5 mg       Return in about 7 weeks (around 04/09/2021) for DILATE OU, AVASTIN OCT, OD.  There are no Patient Instructions on file for this visit.   Explained the diagnoses, plan, and follow up with the patient and they expressed understanding.  Patient expressed understanding of the importance of proper follow up care.   Clent Demark Shahana Capes M.D. Diseases & Surgery of the Retina and Vitreous [facility] 02/19/21     Abbreviations: M myopia (nearsighted); A astigmatism; H hyperopia (farsighted); P presbyopia; Mrx spectacle prescription;  CTL contact lenses; OD right eye; OS left eye; OU both eyes  XT exotropia; ET esotropia; PEK punctate epithelial keratitis; PEE punctate epithelial erosions; DES dry eye syndrome; MGD meibomian gland dysfunction; ATs artificial tears; PFAT's preservative free artificial tears; [place] nuclear sclerotic cataract; PSC posterior subcapsular cataract; ERM epi-retinal membrane; PVD posterior vitreous detachment; RD retinal detachment; DM diabetes mellitus; DR diabetic retinopathy; NPDR  non-proliferative diabetic retinopathy; PDR proliferative diabetic retinopathy; CSME clinically significant macular edema; DME diabetic macular edema; dbh dot blot hemorrhages; CWS cotton wool spot; POAG primary open angle glaucoma; C/D cup-to-disc ratio; HVF humphrey visual field; GVF goldmann visual field; OCT optical coherence tomography; IOP intraocular pressure; BRVO Branch retinal vein occlusion; CRVO central retinal vein occlusion; CRAO central retinal artery occlusion; BRAO branch retinal artery occlusion; RT retinal tear; SB scleral buckle; PPV pars plana vitrectomy; VH Vitreous hemorrhage; PRP panretinal laser photocoagulation; IVK intravitreal kenalog; VMT vitreomacular traction; MH Macular hole;  NVD neovascularization of the disc; NVE neovascularization elsewhere; AREDS age related eye disease study; ARMD age related macular degeneration; POAG primary open angle glaucoma; EBMD epithelial/anterior basement membrane dystrophy; ACIOL anterior chamber intraocular lens; IOL intraocular lens; PCIOL posterior chamber intraocular lens; Phaco/IOL phacoemulsification with intraocular lens placement; [place] photorefractive keratectomy; LASIK laser assisted in situ keratomileusis; HTN hypertension; DM diabetes mellitus; COPD chronic obstructive pulmonary disease

## 2021-02-19 NOTE — Assessment & Plan Note (Signed)
Component of previous BRVO, he accounts for acuity

## 2021-02-22 DIAGNOSIS — G894 Chronic pain syndrome: Secondary | ICD-10-CM | POA: Diagnosis not present

## 2021-02-22 DIAGNOSIS — I10 Essential (primary) hypertension: Secondary | ICD-10-CM | POA: Diagnosis not present

## 2021-04-09 ENCOUNTER — Encounter (INDEPENDENT_AMBULATORY_CARE_PROVIDER_SITE_OTHER): Payer: Medicare PPO | Admitting: Ophthalmology

## 2021-04-17 ENCOUNTER — Encounter (INDEPENDENT_AMBULATORY_CARE_PROVIDER_SITE_OTHER): Payer: Medicare PPO | Admitting: Ophthalmology

## 2021-04-23 ENCOUNTER — Ambulatory Visit (INDEPENDENT_AMBULATORY_CARE_PROVIDER_SITE_OTHER): Payer: Medicare PPO | Admitting: Ophthalmology

## 2021-04-23 ENCOUNTER — Encounter (INDEPENDENT_AMBULATORY_CARE_PROVIDER_SITE_OTHER): Payer: Self-pay | Admitting: Ophthalmology

## 2021-04-23 DIAGNOSIS — H2512 Age-related nuclear cataract, left eye: Secondary | ICD-10-CM

## 2021-04-23 DIAGNOSIS — D3131 Benign neoplasm of right choroid: Secondary | ICD-10-CM

## 2021-04-23 DIAGNOSIS — H2523 Age-related cataract, morgagnian type, bilateral: Secondary | ICD-10-CM | POA: Diagnosis not present

## 2021-04-23 DIAGNOSIS — H2511 Age-related nuclear cataract, right eye: Secondary | ICD-10-CM | POA: Diagnosis not present

## 2021-04-23 DIAGNOSIS — H34831 Tributary (branch) retinal vein occlusion, right eye, with macular edema: Secondary | ICD-10-CM | POA: Diagnosis not present

## 2021-04-23 MED ORDER — BEVACIZUMAB 2.5 MG/0.1ML IZ SOSY
2.5000 mg | PREFILLED_SYRINGE | INTRAVITREAL | Status: AC | PRN
  Administered 2021-04-23: 2.5 mg via INTRAVITREAL

## 2021-04-23 NOTE — Assessment & Plan Note (Signed)
Stable OD °

## 2021-04-23 NOTE — Assessment & Plan Note (Signed)
With secondary macular edema slightly increased at 9-week follow-up interval today.  Had planned 7-week follow-up.  Repeat Avastin today to maintain follow-up next again in 9 weeks ?

## 2021-04-23 NOTE — Assessment & Plan Note (Signed)
Stable

## 2021-04-23 NOTE — Progress Notes (Signed)
? ? ?04/23/2021 ? ?  ? ?CHIEF COMPLAINT ?Patient presents for  ?Chief Complaint  ?Patient presents with  ? Branch Retinal Vein Occlusion  ? ? ? ? ?HISTORY OF PRESENT ILLNESS: ?Dylan Farley is a 66 y.o. male who presents to the clinic today for:  ? ?HPI   ?9 weeks dilate OU, Avastin OD, OCT. ?Patient states vision is stable and unchanged since last visit. Denies any new floaters or FOL. ? ?Last edited by Laurin Coder on 04/23/2021 10:24 AM.  ?  ? ? ?Referring physician: ?Celene Squibb, MD ?[address] ?[address] ?[place],  [place] [address] ? ?HISTORICAL INFORMATION:  ? ?Selected notes from the [facility] ?  ? ?Lab Results  ?Component Value Date  ? HGBA1C 5.3 08/31/2017  ?  ? ?CURRENT MEDICATIONS: ?No current outpatient medications on file. (Ophthalmic Drugs)  ? ?No current facility-administered medications for this visit. (Ophthalmic Drugs)  ? ?Current Outpatient Medications (Other)  ?Medication Sig  ? atorvastatin (LIPITOR) 40 MG tablet Take 1 tablet (40 mg total) by mouth daily at 6 PM.  ? clopidogrel (PLAVIX) 75 MG tablet Take 1 tablet (75 mg total) by mouth daily.  ? losartan (COZAAR) 50 MG tablet Take 50 mg by mouth daily.  ? omeprazole (PRILOSEC) 20 MG capsule Take 1 capsule (20 mg total) by mouth daily.  ? pregabalin (LYRICA) 150 MG capsule Take 1 capsule (150 mg total) by mouth 3 (three) times daily.  ? Tapentadol HCl (NUCYNTA ER) 250 MG TB12 Take 250 mg by mouth 2 (two) times daily.  ? Tapentadol HCl (NUCYNTA) 100 MG TABS Take 100 mg by mouth 4 (four) times daily.  ? ?No current facility-administered medications for this visit. (Other)  ? ? ? ? ?REVIEW OF SYSTEMS: ?ROS   ?Negative for: Constitutional, Gastrointestinal, Neurological, Skin, Genitourinary, Musculoskeletal, HENT, Endocrine, Cardiovascular, Eyes, Respiratory, Psychiatric, Allergic/Imm, Heme/Lymph ?Last edited by Hurman Horn, MD on 04/23/2021 11:24 AM.  ?  ? ? ? ?ALLERGIES ?Allergies  ?Allergen Reactions  ? Morphine And Related Nausea Only   ? ? ?PAST MEDICAL HISTORY ?Past Medical History:  ?Diagnosis Date  ? Depressive disorder, not elsewhere classified   ? Facet syndrome, lumbar   ? Hypertension   ? Lesion of sciatic nerve   ? Lumbosacral spondylosis without myelopathy   ? Other chronic postoperative pain   ? Sciatic neuritis   ? Spinal stenosis, lumbar region, without neurogenic claudication   ? ?Past Surgical History:  ?Procedure Laterality Date  ? KNEE SURGERY    ? ? ?FAMILY HISTORY ?Family History  ?Problem Relation Age of Onset  ? Arthritis Mother   ? Diabetes Brother   ? ? ?SOCIAL HISTORY ?Social History  ? ?Tobacco Use  ? Smoking status: Every Day  ?  Packs/day: 1.00  ?  Years: 45.00  ?  Pack years: 45.00  ?  Types: Cigarettes  ? Smokeless tobacco: Never  ?Substance Use Topics  ? Alcohol use: No  ? Drug use: No  ? ?  ? ?  ? ?OPHTHALMIC EXAM: ? ?Base Eye Exam   ? ? Visual Acuity (ETDRS)   ? ?   Right Left  ? Dist [place] 20/400   ? Dist cc  20/20  ? Dist ph [place] 20/320   ? ? Correction: Glasses  ? ?  ?  ? ? Tonometry (Tonopen, 10:26 AM)   ? ?   Right Left  ? Pressure 10 10  ? ?  ?  ? ?  Pupils   ? ?   Pupils Dark Light APD  ? Right PERRL 5 4 None  ? Left PERRL 5 4 None  ? ?  ?  ? ? Extraocular Movement   ? ?   Right Left  ?  Full Full  ? ?  ?  ? ? Neuro/Psych   ? ? Oriented x3: Yes  ? Mood/Affect: Normal  ? ?  ?  ? ? Dilation   ? ? Both eyes: 1.0% Mydriacyl, 2.5% Phenylephrine @ 10:26 AM  ? ?  ?  ? ?  ? ?Slit Lamp and Fundus Exam   ? ? External Exam   ? ?   Right Left  ? External Normal Normal  ? ?  ?  ? ? Slit Lamp Exam   ? ?   Right Left  ? Lids/Lashes Normal Normal  ? Conjunctiva/Sclera White and quiet White and quiet  ? Cornea Clear Clear  ? Anterior Chamber Deep and quiet Deep and quiet  ? Iris Round and reactive Round and reactive  ? Lens 2+ Nuclear sclerosis 2+ Nuclear sclerosis  ? Anterior Vitreous Normal Normal  ? ?  ?  ? ? Fundus Exam   ? ?   Right Left  ? Posterior Vitreous Normal Normal  ? Disc Normal Normal  ? C/D Ratio 0.3 0.3  ? Macula  Microaneurysms, no clinical thickening Normal  ? Vessels Retinal vein occlusion inferotemporal to the fovea,, with much less intraretinal hemorrhage now,  Normal  ? Periphery Choroidal nevus small flat, 1.5 disc diameters, posterior pole no change, no high risk features, no fluid no atrophy no lipofuscin Normal  ? ?  ?  ? ?  ? ? ?IMAGING AND PROCEDURES  ?Imaging and Procedures for 04/23/21 ? ?Intravitreal Injection, Pharmacologic Agent - OD - Right Eye   ? ?   ?Time Out ?04/23/2021. 11:25 AM. Confirmed correct patient, procedure, site, and patient consented.  ? ?Anesthesia ?Topical anesthesia was used. Anesthetic medications included Lidocaine 4%.  ? ?Procedure ?Preparation included Tobramycin 0.3%, Ofloxacin , 10% betadine to eyelids, 5% betadine to ocular surface. A 30 gauge needle was used.  ? ?Injection: ?2.5 mg bevacizumab 2.5 MG/0.1ML ?  Route: Intravitreal, Site: Right Eye ?  [place]: [id number], Lot: [id number]  ? ?Post-op ?Post injection exam found visual acuity of at least counting fingers. The patient tolerated the procedure well. There were no complications. The patient received written and verbal post procedure care education. Post injection medications included ocuflox.  ? ?  ? ?OCT, Retina - OU - Both Eyes   ? ?   ?Right Eye ?Quality was good. Scan locations included subfoveal. Central Foveal Thickness: 230. Progression has improved. Findings include abnormal foveal contour, vitreomacular adhesion , cystoid macular edema.  ? ?Left Eye ?Quality was borderline. Scan locations included subfoveal. Central Foveal Thickness: 281. Progression has been stable. Findings include normal foveal contour.  ? ?Notes ?Today at 9 week follow-up interval OD with CME overall now improvement from BRVO slight increase in size.  We will repeat injection OD today and maintain follow-up examination next in 9 weeks ? ?  ? ? ?  ?  ? ?  ?ASSESSMENT/PLAN: ? ?Branch retinal vein occlusion with macular edema of right eye ?With secondary  macular edema slightly increased at 9-week follow-up interval today.  Had planned 7-week follow-up.  Repeat Avastin today to maintain follow-up next again in 9 weeks ? ?Nuclear sclerotic cataract of right eye ?Stable ? ?Nuclear sclerotic cataract of left  eye ?Stable ? ?Choroidal nevus, right ?Stable OD  ? ?  ICD-10-CM   ?1. Branch retinal vein occlusion with macular edema of right eye  H34.8310 Intravitreal Injection, Pharmacologic Agent - OD - Right Eye  ?  OCT, Retina - OU - Both Eyes  ?  bevacizumab (AVASTIN) SOSY 2.5 mg  ?  ?2. Nuclear sclerotic cataract of right eye  H25.11   ?  ?3. Nuclear sclerotic cataract of left eye  H25.12   ?  ?4. Choroidal nevus, right  D31.31   ?  ? ? ?1.  OD today at 9-week follow-up interval with only minor recurrence of CME perifoveal.  Vision limited however by previous atrophy induced by transient CRAO in the past. ? ?We will continue to monitor residual CME so as to prevent progression. ? ?2.  Repeat Avastin OD today and examination next again in 9 weeks ? ?3. ? ?Ophthalmic Meds Ordered this visit:  ?Meds ordered this encounter  ?Medications  ? bevacizumab (AVASTIN) SOSY 2.5 mg  ? ? ?  ? ?Return in about 9 weeks (around 06/25/2021) for dilate, OD, AVASTIN OCT. ? ?There are no Patient Instructions on file for this visit. ? ? ?Explained the diagnoses, plan, and follow up with the patient and they expressed understanding.  Patient expressed understanding of the importance of proper follow up care.  ? ?Clent Demark. Maury Groninger M.D. ?Diseases & Surgery of the Retina and Vitreous ?[place] ?04/23/21 ? ? ? ? ?Abbreviations: ?M myopia (nearsighted); A astigmatism; H hyperopia (farsighted); P presbyopia; Mrx spectacle prescription;  CTL contact lenses; OD right eye; OS left eye; OU both eyes  XT exotropia; ET esotropia; PEK punctate epithelial keratitis; PEE punctate epithelial erosions; DES dry eye syndrome; MGD meibomian gland dysfunction; ATs artificial tears; PFAT's  preservative free artificial tears; [place] nuclear sclerotic cataract; PSC posterior subcapsular cataract; ERM epi-retinal membrane; PVD posterior vitreous detachment; RD retinal detachment; DM diabetes mellitus;

## 2021-05-12 DIAGNOSIS — I10 Essential (primary) hypertension: Secondary | ICD-10-CM | POA: Diagnosis not present

## 2021-05-12 DIAGNOSIS — E782 Mixed hyperlipidemia: Secondary | ICD-10-CM | POA: Diagnosis not present

## 2021-05-12 DIAGNOSIS — K219 Gastro-esophageal reflux disease without esophagitis: Secondary | ICD-10-CM | POA: Diagnosis not present

## 2021-05-17 DIAGNOSIS — E782 Mixed hyperlipidemia: Secondary | ICD-10-CM | POA: Diagnosis not present

## 2021-05-17 DIAGNOSIS — R7301 Impaired fasting glucose: Secondary | ICD-10-CM | POA: Diagnosis not present

## 2021-05-21 DIAGNOSIS — Z8673 Personal history of transient ischemic attack (TIA), and cerebral infarction without residual deficits: Secondary | ICD-10-CM | POA: Diagnosis not present

## 2021-05-21 DIAGNOSIS — D649 Anemia, unspecified: Secondary | ICD-10-CM | POA: Diagnosis not present

## 2021-05-21 DIAGNOSIS — E782 Mixed hyperlipidemia: Secondary | ICD-10-CM | POA: Diagnosis not present

## 2021-05-21 DIAGNOSIS — M5441 Lumbago with sciatica, right side: Secondary | ICD-10-CM | POA: Diagnosis not present

## 2021-05-21 DIAGNOSIS — D6949 Other primary thrombocytopenia: Secondary | ICD-10-CM | POA: Diagnosis not present

## 2021-05-21 DIAGNOSIS — I10 Essential (primary) hypertension: Secondary | ICD-10-CM | POA: Diagnosis not present

## 2021-05-21 DIAGNOSIS — E875 Hyperkalemia: Secondary | ICD-10-CM | POA: Diagnosis not present

## 2021-05-21 DIAGNOSIS — R7301 Impaired fasting glucose: Secondary | ICD-10-CM | POA: Diagnosis not present

## 2021-05-21 DIAGNOSIS — M5442 Lumbago with sciatica, left side: Secondary | ICD-10-CM | POA: Diagnosis not present

## 2021-05-22 DIAGNOSIS — M79671 Pain in right foot: Secondary | ICD-10-CM | POA: Diagnosis not present

## 2021-05-22 DIAGNOSIS — M722 Plantar fascial fibromatosis: Secondary | ICD-10-CM | POA: Diagnosis not present

## 2021-06-25 ENCOUNTER — Encounter (INDEPENDENT_AMBULATORY_CARE_PROVIDER_SITE_OTHER): Payer: Self-pay | Admitting: Ophthalmology

## 2021-06-25 ENCOUNTER — Ambulatory Visit (INDEPENDENT_AMBULATORY_CARE_PROVIDER_SITE_OTHER): Payer: Medicare PPO | Admitting: Ophthalmology

## 2021-06-25 DIAGNOSIS — H34831 Tributary (branch) retinal vein occlusion, right eye, with macular edema: Secondary | ICD-10-CM | POA: Diagnosis not present

## 2021-06-25 DIAGNOSIS — H2511 Age-related nuclear cataract, right eye: Secondary | ICD-10-CM | POA: Diagnosis not present

## 2021-06-25 DIAGNOSIS — H2513 Age-related nuclear cataract, bilateral: Secondary | ICD-10-CM

## 2021-06-25 DIAGNOSIS — H2512 Age-related nuclear cataract, left eye: Secondary | ICD-10-CM

## 2021-06-25 DIAGNOSIS — H3411 Central retinal artery occlusion, right eye: Secondary | ICD-10-CM | POA: Diagnosis not present

## 2021-06-25 MED ORDER — BEVACIZUMAB 2.5 MG/0.1ML IZ SOSY
2.5000 mg | PREFILLED_SYRINGE | INTRAVITREAL | Status: AC | PRN
  Administered 2021-06-25: 2.5 mg via INTRAVITREAL

## 2021-06-25 NOTE — Assessment & Plan Note (Signed)
The nature of branch retinal vein occlusion with macular edema was discussed.  The patient was given access to printed information.  The treatment options including continued observation looking for spontaneous resolution versus grid laser versus intravitreal Kenalog injection were discussed.  PRIMARY THERAPY CONSISTS of Anti-VEGF Therapies, AVASTIN, LUCENTIS AND EYLEA.  Their usage was discussed to assist in halting the progression of Macular Edema, in order to preserve, protect or improve acuity.  Additionally, at times, limited focal laser therapy is used in the management.  The risks and benefits of all these options were discussed with the patient.    The patient's questions were answered.,  BRVO with CME mostly controlled and not center involved however for acuity remains.

## 2021-06-25 NOTE — Assessment & Plan Note (Signed)
Moderate

## 2021-06-25 NOTE — Progress Notes (Signed)
06/25/2021     CHIEF COMPLAINT Patient presents for  Chief Complaint  Patient presents with   Branch Retinal Vein Occlusion      HISTORY OF PRESENT ILLNESS: Dylan Farley is a 66 y.o. male who presents to the clinic today for:   HPI   9 weeks for DILATE OD, AVASTIN . Pt stated vision has been stable since last visit. Pt denies floaters and FOL.   Last edited by Silvestre Moment on 06/25/2021 10:41 AM.      Referring physician: Celene Squibb, MD [place],  [place] [address]  HISTORICAL INFORMATION:   Selected notes from the MEDICAL RECORD NUMBER    Lab Results  Component Value Date   HGBA1C 5.3 08/31/2017     CURRENT MEDICATIONS: No current outpatient medications on file. (Ophthalmic Drugs)   No current facility-administered medications for this visit. (Ophthalmic Drugs)   Current Outpatient Medications (Other)  Medication Sig   atorvastatin (LIPITOR) 40 MG tablet Take 1 tablet (40 mg total) by mouth daily at 6 PM.   clopidogrel (PLAVIX) 75 MG tablet Take 1 tablet (75 mg total) by mouth daily.   losartan (COZAAR) 50 MG tablet Take 50 mg by mouth daily.   omeprazole (PRILOSEC) 20 MG capsule Take 1 capsule (20 mg total) by mouth daily.   pregabalin (LYRICA) 150 MG capsule Take 1 capsule (150 mg total) by mouth 3 (three) times daily.   Tapentadol HCl (NUCYNTA ER) 250 MG TB12 Take 250 mg by mouth 2 (two) times daily.   Tapentadol HCl (NUCYNTA) 100 MG TABS Take 100 mg by mouth 4 (four) times daily.   No current facility-administered medications for this visit. (Other)      REVIEW OF SYSTEMS: ROS   Negative for: Constitutional, Gastrointestinal, Neurological, Skin, Genitourinary, Musculoskeletal, HENT, Endocrine, Cardiovascular, Eyes, Respiratory, Psychiatric, Allergic/Imm, Heme/Lymph Last edited by Silvestre Moment on 06/25/2021 10:41 AM.       ALLERGIES Allergies  Allergen Reactions   Morphine And Related Nausea Only    PAST MEDICAL HISTORY Past  Medical History:  Diagnosis Date   Depressive disorder, not elsewhere classified    Facet syndrome, lumbar    Hypertension    Lesion of sciatic nerve    Lumbosacral spondylosis without myelopathy    Other chronic postoperative pain    Sciatic neuritis    Spinal stenosis, lumbar region, without neurogenic claudication    Past Surgical History:  Procedure Laterality Date   KNEE SURGERY      FAMILY HISTORY Family History  Problem Relation Age of Onset   Arthritis Mother    Diabetes Brother     SOCIAL HISTORY Social History   Tobacco Use   Smoking status: Every Day    Packs/day: 1.00    Years: 45.00    Total pack years: 45.00    Types: Cigarettes   Smokeless tobacco: Never  Substance Use Topics   Alcohol use: No   Drug use: No         OPHTHALMIC EXAM:  Base Eye Exam     Visual Acuity (ETDRS)       Right Left   Dist [place] 20/200 20/30 -2   Dist ph [place] 20/150 NI         Tonometry (Tonopen, 10:45 AM)       Right Left   Pressure 10 13         Pupils       Pupils APD   Right PERRL  None   Left PERRL None         Visual Fields       Left Right    Full Full         Extraocular Movement       Right Left    Full Full         Neuro/Psych     Oriented x3: Yes   Mood/Affect: Normal         Dilation     Right eye: 2.5% Phenylephrine, 1.0% Mydriacyl @ 10:45 AM           Slit Lamp and Fundus Exam     External Exam       Right Left   External Normal Normal         Slit Lamp Exam       Right Left   Lids/Lashes Normal Normal   Conjunctiva/Sclera White and quiet White and quiet   Cornea Clear Clear   Anterior Chamber Deep and quiet Deep and quiet   Iris Round and reactive Round and reactive   Lens 2+ Nuclear sclerosis 2+ Nuclear sclerosis   Anterior Vitreous Normal Normal         Fundus Exam       Right Left   Posterior Vitreous Normal    Disc Normal    C/D Ratio 0.3    Macula Microaneurysms, 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 Scan locations included subfoveal. Central Foveal Thickness: 236. Progression has improved. Findings include abnormal foveal contour, cystoid macular edema, vitreomacular adhesion .   Left Eye Quality was borderline. Scan locations included subfoveal. Central Foveal Thickness: 281. Progression has been stable. Findings include normal foveal contour.   Notes Today at 9 week follow-up interval OD with CME overall now improvement from BRVO slight increase in size.  We will repeat injection OD today and maintain follow-up examination next in  14  weeks     Intravitreal Injection, Pharmacologic Agent - OD - Right Eye       Time Out 06/25/2021. 11:17 AM. Confirmed correct patient, procedure, site, and patient consented.   Anesthesia Topical anesthesia was used. Anesthetic medications included Lidocaine 4%.   Procedure Preparation included 5% betadine to ocular surface, 10% betadine to eyelids, Tobramycin 0.3%, Ofloxacin . A 30 gauge needle was used.   Injection: 2.5 mg bevacizumab 2.5 MG/0.1ML   Route: Intravitreal, Site: Right Eye   NDC: [phone], Lot: [id number], Expiration date: 08/11/2021   Post-op Post injection exam found visual acuity of at least counting fingers. The patient tolerated the procedure well. There were no complications. The patient received written and verbal post procedure care education. Post injection medications included ocuflox.              ASSESSMENT/PLAN:  Branch retinal vein occlusion with macular edema of right eye The nature of branch retinal  vein occlusion with macular edema was discussed.  The patient was given access to printed information.  The treatment options including continued observation looking for spontaneous resolution versus  grid laser versus intravitreal Kenalog injection were discussed.  PRIMARY THERAPY CONSISTS of Anti-VEGF Therapies, AVASTIN, LUCENTIS AND EYLEA.  Their usage was discussed to assist in halting the progression of Macular Edema, in order to preserve, protect or improve acuity.  Additionally, at times, limited focal laser therapy is used in the management.  The risks and benefits of all these options were discussed with the patient.    The patient's questions were answered.,  BRVO with CME mostly controlled and not center involved however for acuity remains.  Nuclear sclerotic cataract of right eye Moderate   Nuclear sclerotic cataract of left eye Moderate  Central retinal artery occlusion of right eye Likely reperfused CR AO with loss of definition of the intraretinal structures in the p.m. bundle     ICD-10-CM   1. 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 Nuclear sclerotic cataract of right eye  H25.11     3. Nuclear sclerotic cataract of left eye  H25.12     4. Central retinal artery occlusion of right eye  H34.11       1.  OD, stabilize BRVO component of disease.  We will continue to monitor closely.  Repeat injection today and reevaluate dilate OU in 4 months  2.  3.  Ophthalmic Meds Ordered this visit:  Meds ordered this encounter  Medications   bevacizumab (AVASTIN) SOSY 2.5 mg       Return in about 4 months (around 10/25/2021) for DILATE OU, AVASTIN OCT, OD.  There are no Patient Instructions on file for this visit.   Explained the diagnoses, plan, and follow up with the patient and they expressed  understanding.  Patient expressed understanding of the importance of proper follow up care.   Clent Demark Isaah Furry M.D. Diseases & Surgery of the Retina and Vitreous [facility] [place] 06/25/21     Abbreviations: M myopia (nearsighted); A astigmatism; H hyperopia (farsighted); P presbyopia; Mrx spectacle prescription;  CTL contact lenses; OD right eye; OS left eye; OU both eyes  XT exotropia; ET esotropia; PEK punctate epithelial keratitis; PEE punctate epithelial erosions; DES dry eye syndrome; MGD meibomian gland dysfunction; ATs artificial tears; PFAT's preservative free artificial tears; [place] nuclear sclerotic cataract; PSC posterior subcapsular cataract; ERM epi-retinal membrane; PVD posterior vitreous detachment; RD retinal detachment; DM diabetes mellitus; DR diabetic retinopathy; NPDR non-proliferative diabetic retinopathy; PDR proliferative diabetic retinopathy; CSME clinically significant macular edema; DME diabetic macular edema; dbh dot blot hemorrhages; CWS cotton wool spot; POAG primary open angle glaucoma; C/D cup-to-disc ratio; HVF humphrey visual field; GVF goldmann visual field; OCT optical coherence tomography; IOP intraocular pressure; BRVO Branch retinal vein occlusion; CRVO central retinal vein occlusion; CRAO central retinal artery occlusion; BRAO branch retinal artery occlusion; RT retinal tear; SB scleral buckle; PPV pars plana vitrectomy; VH Vitreous hemorrhage; PRP panretinal laser photocoagulation; IVK intravitreal kenalog; VMT vitreomacular traction; MH Macular hole;  NVD neovascularization of the disc; NVE neovascularization elsewhere; AREDS age related eye disease study; ARMD age related macular degeneration; POAG primary open angle glaucoma; EBMD epithelial/anterior basement membrane dystrophy; ACIOL anterior chamber intraocular lens; IOL intraocular lens; PCIOL posterior chamber intraocular lens; Phaco/IOL phacoemulsification with intraocular lens placement; [place]  photorefractive keratectomy; LASIK laser assisted in situ keratomileusis; HTN hypertension; DM diabetes mellitus; COPD chronic obstructive pulmonary disease

## 2021-06-25 NOTE — Assessment & Plan Note (Signed)
Likely reperfused CR AO with loss of definition of the intraretinal structures in the p.m. bundle

## 2021-08-26 DIAGNOSIS — G894 Chronic pain syndrome: Secondary | ICD-10-CM | POA: Diagnosis not present

## 2021-08-26 DIAGNOSIS — M7592 Shoulder lesion, unspecified, left shoulder: Secondary | ICD-10-CM | POA: Diagnosis not present

## 2021-10-29 ENCOUNTER — Encounter (INDEPENDENT_AMBULATORY_CARE_PROVIDER_SITE_OTHER): Payer: Medicare PPO | Admitting: Ophthalmology

## 2021-11-05 ENCOUNTER — Encounter (INDEPENDENT_AMBULATORY_CARE_PROVIDER_SITE_OTHER): Payer: Medicare PPO | Admitting: Ophthalmology

## 2021-11-14 DIAGNOSIS — E782 Mixed hyperlipidemia: Secondary | ICD-10-CM | POA: Diagnosis not present

## 2021-11-14 DIAGNOSIS — R7301 Impaired fasting glucose: Secondary | ICD-10-CM | POA: Diagnosis not present

## 2021-11-21 DIAGNOSIS — I10 Essential (primary) hypertension: Secondary | ICD-10-CM | POA: Diagnosis not present

## 2021-11-21 DIAGNOSIS — Z23 Encounter for immunization: Secondary | ICD-10-CM | POA: Diagnosis not present

## 2021-11-21 DIAGNOSIS — D649 Anemia, unspecified: Secondary | ICD-10-CM | POA: Diagnosis not present

## 2021-11-21 DIAGNOSIS — R7301 Impaired fasting glucose: Secondary | ICD-10-CM | POA: Diagnosis not present

## 2021-11-21 DIAGNOSIS — R944 Abnormal results of kidney function studies: Secondary | ICD-10-CM | POA: Diagnosis not present

## 2021-11-21 DIAGNOSIS — E782 Mixed hyperlipidemia: Secondary | ICD-10-CM | POA: Diagnosis not present

## 2021-11-21 DIAGNOSIS — E875 Hyperkalemia: Secondary | ICD-10-CM | POA: Diagnosis not present

## 2021-11-21 DIAGNOSIS — D6949 Other primary thrombocytopenia: Secondary | ICD-10-CM | POA: Diagnosis not present

## 2021-11-21 DIAGNOSIS — Z8673 Personal history of transient ischemic attack (TIA), and cerebral infarction without residual deficits: Secondary | ICD-10-CM | POA: Diagnosis not present

## 2021-12-16 DIAGNOSIS — H34831 Tributary (branch) retinal vein occlusion, right eye, with macular edema: Secondary | ICD-10-CM | POA: Diagnosis not present

## 2021-12-16 DIAGNOSIS — H3411 Central retinal artery occlusion, right eye: Secondary | ICD-10-CM | POA: Diagnosis not present

## 2021-12-16 DIAGNOSIS — D3131 Benign neoplasm of right choroid: Secondary | ICD-10-CM | POA: Diagnosis not present

## 2021-12-16 DIAGNOSIS — H2513 Age-related nuclear cataract, bilateral: Secondary | ICD-10-CM | POA: Diagnosis not present

## 2022-02-10 DIAGNOSIS — H25811 Combined forms of age-related cataract, right eye: Secondary | ICD-10-CM | POA: Diagnosis not present

## 2022-02-10 DIAGNOSIS — H02834 Dermatochalasis of left upper eyelid: Secondary | ICD-10-CM | POA: Diagnosis not present

## 2022-02-10 DIAGNOSIS — H02831 Dermatochalasis of right upper eyelid: Secondary | ICD-10-CM | POA: Diagnosis not present

## 2022-02-10 DIAGNOSIS — H3582 Retinal ischemia: Secondary | ICD-10-CM | POA: Diagnosis not present

## 2022-02-25 DIAGNOSIS — G894 Chronic pain syndrome: Secondary | ICD-10-CM | POA: Diagnosis not present

## 2022-02-25 DIAGNOSIS — I10 Essential (primary) hypertension: Secondary | ICD-10-CM | POA: Diagnosis not present

## 2022-02-25 DIAGNOSIS — H269 Unspecified cataract: Secondary | ICD-10-CM | POA: Diagnosis not present

## 2022-02-28 DIAGNOSIS — H25811 Combined forms of age-related cataract, right eye: Secondary | ICD-10-CM | POA: Diagnosis not present

## 2022-03-06 DIAGNOSIS — H3582 Retinal ischemia: Secondary | ICD-10-CM | POA: Diagnosis not present

## 2022-03-06 DIAGNOSIS — Z961 Presence of intraocular lens: Secondary | ICD-10-CM | POA: Diagnosis not present

## 2022-03-11 DIAGNOSIS — H2512 Age-related nuclear cataract, left eye: Secondary | ICD-10-CM | POA: Diagnosis not present

## 2022-03-14 DIAGNOSIS — H25812 Combined forms of age-related cataract, left eye: Secondary | ICD-10-CM | POA: Diagnosis not present

## 2022-03-24 DIAGNOSIS — H3411 Central retinal artery occlusion, right eye: Secondary | ICD-10-CM | POA: Diagnosis not present

## 2022-03-24 DIAGNOSIS — H34831 Tributary (branch) retinal vein occlusion, right eye, with macular edema: Secondary | ICD-10-CM | POA: Diagnosis not present

## 2022-03-24 DIAGNOSIS — D3131 Benign neoplasm of right choroid: Secondary | ICD-10-CM | POA: Diagnosis not present

## 2022-05-15 DIAGNOSIS — Z125 Encounter for screening for malignant neoplasm of prostate: Secondary | ICD-10-CM | POA: Diagnosis not present

## 2022-05-15 DIAGNOSIS — E782 Mixed hyperlipidemia: Secondary | ICD-10-CM | POA: Diagnosis not present

## 2022-05-15 DIAGNOSIS — R7301 Impaired fasting glucose: Secondary | ICD-10-CM | POA: Diagnosis not present

## 2022-05-19 DIAGNOSIS — H43812 Vitreous degeneration, left eye: Secondary | ICD-10-CM | POA: Diagnosis not present

## 2022-05-19 DIAGNOSIS — H3411 Central retinal artery occlusion, right eye: Secondary | ICD-10-CM | POA: Diagnosis not present

## 2022-05-19 DIAGNOSIS — H34831 Tributary (branch) retinal vein occlusion, right eye, with macular edema: Secondary | ICD-10-CM | POA: Diagnosis not present

## 2022-05-19 DIAGNOSIS — D3131 Benign neoplasm of right choroid: Secondary | ICD-10-CM | POA: Diagnosis not present

## 2022-05-22 DIAGNOSIS — E782 Mixed hyperlipidemia: Secondary | ICD-10-CM | POA: Diagnosis not present

## 2022-05-22 DIAGNOSIS — I10 Essential (primary) hypertension: Secondary | ICD-10-CM | POA: Diagnosis not present

## 2022-05-22 DIAGNOSIS — R944 Abnormal results of kidney function studies: Secondary | ICD-10-CM | POA: Diagnosis not present

## 2022-05-22 DIAGNOSIS — G894 Chronic pain syndrome: Secondary | ICD-10-CM | POA: Diagnosis not present

## 2022-05-22 DIAGNOSIS — D649 Anemia, unspecified: Secondary | ICD-10-CM | POA: Diagnosis not present

## 2022-05-22 DIAGNOSIS — Z0000 Encounter for general adult medical examination without abnormal findings: Secondary | ICD-10-CM | POA: Diagnosis not present

## 2022-05-22 DIAGNOSIS — R7303 Prediabetes: Secondary | ICD-10-CM | POA: Diagnosis not present

## 2022-05-22 DIAGNOSIS — D6949 Other primary thrombocytopenia: Secondary | ICD-10-CM | POA: Diagnosis not present

## 2022-05-22 DIAGNOSIS — H269 Unspecified cataract: Secondary | ICD-10-CM | POA: Diagnosis not present

## 2022-07-07 DIAGNOSIS — D3131 Benign neoplasm of right choroid: Secondary | ICD-10-CM | POA: Diagnosis not present

## 2022-07-07 DIAGNOSIS — H43812 Vitreous degeneration, left eye: Secondary | ICD-10-CM | POA: Diagnosis not present

## 2022-07-07 DIAGNOSIS — H34831 Tributary (branch) retinal vein occlusion, right eye, with macular edema: Secondary | ICD-10-CM | POA: Diagnosis not present

## 2022-07-07 DIAGNOSIS — H3411 Central retinal artery occlusion, right eye: Secondary | ICD-10-CM | POA: Diagnosis not present

## 2022-08-25 DIAGNOSIS — M545 Low back pain, unspecified: Secondary | ICD-10-CM | POA: Diagnosis not present

## 2022-08-25 DIAGNOSIS — Z79899 Other long term (current) drug therapy: Secondary | ICD-10-CM | POA: Diagnosis not present

## 2022-08-25 DIAGNOSIS — I10 Essential (primary) hypertension: Secondary | ICD-10-CM | POA: Diagnosis not present

## 2022-08-25 DIAGNOSIS — G894 Chronic pain syndrome: Secondary | ICD-10-CM | POA: Diagnosis not present

## 2022-08-25 DIAGNOSIS — Z87891 Personal history of nicotine dependence: Secondary | ICD-10-CM | POA: Diagnosis not present

## 2022-08-25 DIAGNOSIS — M5441 Lumbago with sciatica, right side: Secondary | ICD-10-CM | POA: Diagnosis not present

## 2022-08-25 DIAGNOSIS — K219 Gastro-esophageal reflux disease without esophagitis: Secondary | ICD-10-CM | POA: Diagnosis not present

## 2022-08-25 DIAGNOSIS — Z713 Dietary counseling and surveillance: Secondary | ICD-10-CM | POA: Diagnosis not present

## 2022-08-25 DIAGNOSIS — M5442 Lumbago with sciatica, left side: Secondary | ICD-10-CM | POA: Diagnosis not present

## 2022-09-08 DIAGNOSIS — D3131 Benign neoplasm of right choroid: Secondary | ICD-10-CM | POA: Diagnosis not present

## 2022-09-08 DIAGNOSIS — H3411 Central retinal artery occlusion, right eye: Secondary | ICD-10-CM | POA: Diagnosis not present

## 2022-09-08 DIAGNOSIS — H43812 Vitreous degeneration, left eye: Secondary | ICD-10-CM | POA: Diagnosis not present

## 2022-09-08 DIAGNOSIS — H34831 Tributary (branch) retinal vein occlusion, right eye, with macular edema: Secondary | ICD-10-CM | POA: Diagnosis not present

## 2022-09-08 DIAGNOSIS — H43821 Vitreomacular adhesion, right eye: Secondary | ICD-10-CM | POA: Diagnosis not present

## 2022-10-27 DIAGNOSIS — H43821 Vitreomacular adhesion, right eye: Secondary | ICD-10-CM | POA: Diagnosis not present

## 2022-10-27 DIAGNOSIS — H34831 Tributary (branch) retinal vein occlusion, right eye, with macular edema: Secondary | ICD-10-CM | POA: Diagnosis not present

## 2022-10-27 DIAGNOSIS — H3411 Central retinal artery occlusion, right eye: Secondary | ICD-10-CM | POA: Diagnosis not present

## 2022-10-27 DIAGNOSIS — H43812 Vitreous degeneration, left eye: Secondary | ICD-10-CM | POA: Diagnosis not present

## 2022-10-27 DIAGNOSIS — D3131 Benign neoplasm of right choroid: Secondary | ICD-10-CM | POA: Diagnosis not present

## 2022-10-28 DIAGNOSIS — H3582 Retinal ischemia: Secondary | ICD-10-CM | POA: Diagnosis not present

## 2022-10-28 DIAGNOSIS — H02834 Dermatochalasis of left upper eyelid: Secondary | ICD-10-CM | POA: Diagnosis not present

## 2022-10-28 DIAGNOSIS — Z961 Presence of intraocular lens: Secondary | ICD-10-CM | POA: Diagnosis not present

## 2022-10-28 DIAGNOSIS — H02831 Dermatochalasis of right upper eyelid: Secondary | ICD-10-CM | POA: Diagnosis not present

## 2022-11-18 DIAGNOSIS — E782 Mixed hyperlipidemia: Secondary | ICD-10-CM | POA: Diagnosis not present

## 2022-11-18 DIAGNOSIS — R7303 Prediabetes: Secondary | ICD-10-CM | POA: Diagnosis not present

## 2022-11-24 DIAGNOSIS — M5442 Lumbago with sciatica, left side: Secondary | ICD-10-CM | POA: Diagnosis not present

## 2022-11-24 DIAGNOSIS — M5441 Lumbago with sciatica, right side: Secondary | ICD-10-CM | POA: Diagnosis not present

## 2022-11-24 DIAGNOSIS — K219 Gastro-esophageal reflux disease without esophagitis: Secondary | ICD-10-CM | POA: Diagnosis not present

## 2022-11-24 DIAGNOSIS — G894 Chronic pain syndrome: Secondary | ICD-10-CM | POA: Diagnosis not present

## 2022-11-24 DIAGNOSIS — H269 Unspecified cataract: Secondary | ICD-10-CM | POA: Diagnosis not present

## 2022-11-24 DIAGNOSIS — E782 Mixed hyperlipidemia: Secondary | ICD-10-CM | POA: Diagnosis not present

## 2022-11-24 DIAGNOSIS — I10 Essential (primary) hypertension: Secondary | ICD-10-CM | POA: Diagnosis not present

## 2022-11-24 DIAGNOSIS — H6123 Impacted cerumen, bilateral: Secondary | ICD-10-CM | POA: Diagnosis not present

## 2022-11-24 DIAGNOSIS — Z23 Encounter for immunization: Secondary | ICD-10-CM | POA: Diagnosis not present

## 2022-12-15 DIAGNOSIS — H43821 Vitreomacular adhesion, right eye: Secondary | ICD-10-CM | POA: Diagnosis not present

## 2022-12-15 DIAGNOSIS — H34831 Tributary (branch) retinal vein occlusion, right eye, with macular edema: Secondary | ICD-10-CM | POA: Diagnosis not present

## 2022-12-15 DIAGNOSIS — H3411 Central retinal artery occlusion, right eye: Secondary | ICD-10-CM | POA: Diagnosis not present

## 2022-12-15 DIAGNOSIS — D3131 Benign neoplasm of right choroid: Secondary | ICD-10-CM | POA: Diagnosis not present

## 2022-12-15 DIAGNOSIS — H43812 Vitreous degeneration, left eye: Secondary | ICD-10-CM | POA: Diagnosis not present

## 2023-02-16 DIAGNOSIS — D3131 Benign neoplasm of right choroid: Secondary | ICD-10-CM | POA: Diagnosis not present

## 2023-02-16 DIAGNOSIS — H43812 Vitreous degeneration, left eye: Secondary | ICD-10-CM | POA: Diagnosis not present

## 2023-02-16 DIAGNOSIS — H3411 Central retinal artery occlusion, right eye: Secondary | ICD-10-CM | POA: Diagnosis not present

## 2023-02-16 DIAGNOSIS — H34831 Tributary (branch) retinal vein occlusion, right eye, with macular edema: Secondary | ICD-10-CM | POA: Diagnosis not present

## 2023-02-16 DIAGNOSIS — H43821 Vitreomacular adhesion, right eye: Secondary | ICD-10-CM | POA: Diagnosis not present

## 2023-02-24 DIAGNOSIS — G629 Polyneuropathy, unspecified: Secondary | ICD-10-CM | POA: Diagnosis not present

## 2023-02-24 DIAGNOSIS — M5441 Lumbago with sciatica, right side: Secondary | ICD-10-CM | POA: Diagnosis not present

## 2023-02-24 DIAGNOSIS — M5440 Lumbago with sciatica, unspecified side: Secondary | ICD-10-CM | POA: Diagnosis not present

## 2023-02-24 DIAGNOSIS — Z713 Dietary counseling and surveillance: Secondary | ICD-10-CM | POA: Diagnosis not present

## 2023-02-24 DIAGNOSIS — Z87891 Personal history of nicotine dependence: Secondary | ICD-10-CM | POA: Diagnosis not present

## 2023-02-24 DIAGNOSIS — I10 Essential (primary) hypertension: Secondary | ICD-10-CM | POA: Diagnosis not present

## 2023-02-24 DIAGNOSIS — G894 Chronic pain syndrome: Secondary | ICD-10-CM | POA: Diagnosis not present

## 2023-02-24 DIAGNOSIS — Z6827 Body mass index (BMI) 27.0-27.9, adult: Secondary | ICD-10-CM | POA: Diagnosis not present

## 2023-02-24 DIAGNOSIS — K219 Gastro-esophageal reflux disease without esophagitis: Secondary | ICD-10-CM | POA: Diagnosis not present

## 2023-04-13 DIAGNOSIS — H43812 Vitreous degeneration, left eye: Secondary | ICD-10-CM | POA: Diagnosis not present

## 2023-04-13 DIAGNOSIS — D3131 Benign neoplasm of right choroid: Secondary | ICD-10-CM | POA: Diagnosis not present

## 2023-04-13 DIAGNOSIS — H3411 Central retinal artery occlusion, right eye: Secondary | ICD-10-CM | POA: Diagnosis not present

## 2023-04-13 DIAGNOSIS — H34831 Tributary (branch) retinal vein occlusion, right eye, with macular edema: Secondary | ICD-10-CM | POA: Diagnosis not present

## 2023-04-13 DIAGNOSIS — H43821 Vitreomacular adhesion, right eye: Secondary | ICD-10-CM | POA: Diagnosis not present

## 2023-05-19 DIAGNOSIS — R7303 Prediabetes: Secondary | ICD-10-CM | POA: Diagnosis not present

## 2023-05-19 DIAGNOSIS — E782 Mixed hyperlipidemia: Secondary | ICD-10-CM | POA: Diagnosis not present

## 2023-05-25 DIAGNOSIS — E782 Mixed hyperlipidemia: Secondary | ICD-10-CM | POA: Diagnosis not present

## 2023-05-25 DIAGNOSIS — G894 Chronic pain syndrome: Secondary | ICD-10-CM | POA: Diagnosis not present

## 2023-05-25 DIAGNOSIS — G629 Polyneuropathy, unspecified: Secondary | ICD-10-CM | POA: Diagnosis not present

## 2023-05-25 DIAGNOSIS — H269 Unspecified cataract: Secondary | ICD-10-CM | POA: Diagnosis not present

## 2023-05-25 DIAGNOSIS — I10 Essential (primary) hypertension: Secondary | ICD-10-CM | POA: Diagnosis not present

## 2023-05-25 DIAGNOSIS — M5442 Lumbago with sciatica, left side: Secondary | ICD-10-CM | POA: Diagnosis not present

## 2023-05-25 DIAGNOSIS — K219 Gastro-esophageal reflux disease without esophagitis: Secondary | ICD-10-CM | POA: Diagnosis not present

## 2023-05-25 DIAGNOSIS — M5441 Lumbago with sciatica, right side: Secondary | ICD-10-CM | POA: Diagnosis not present

## 2023-05-25 DIAGNOSIS — Z23 Encounter for immunization: Secondary | ICD-10-CM | POA: Diagnosis not present

## 2023-06-09 DIAGNOSIS — H34831 Tributary (branch) retinal vein occlusion, right eye, with macular edema: Secondary | ICD-10-CM | POA: Diagnosis not present

## 2023-06-09 DIAGNOSIS — H02833 Dermatochalasis of right eye, unspecified eyelid: Secondary | ICD-10-CM | POA: Diagnosis not present

## 2023-06-09 DIAGNOSIS — H3411 Central retinal artery occlusion, right eye: Secondary | ICD-10-CM | POA: Diagnosis not present

## 2023-06-09 DIAGNOSIS — H43812 Vitreous degeneration, left eye: Secondary | ICD-10-CM | POA: Diagnosis not present

## 2023-06-09 DIAGNOSIS — H02836 Dermatochalasis of left eye, unspecified eyelid: Secondary | ICD-10-CM | POA: Diagnosis not present

## 2023-06-09 DIAGNOSIS — D3131 Benign neoplasm of right choroid: Secondary | ICD-10-CM | POA: Diagnosis not present

## 2023-06-09 DIAGNOSIS — H43821 Vitreomacular adhesion, right eye: Secondary | ICD-10-CM | POA: Diagnosis not present

## 2023-08-10 DIAGNOSIS — H02833 Dermatochalasis of right eye, unspecified eyelid: Secondary | ICD-10-CM | POA: Diagnosis not present

## 2023-08-10 DIAGNOSIS — H43812 Vitreous degeneration, left eye: Secondary | ICD-10-CM | POA: Diagnosis not present

## 2023-08-10 DIAGNOSIS — H3411 Central retinal artery occlusion, right eye: Secondary | ICD-10-CM | POA: Diagnosis not present

## 2023-08-10 DIAGNOSIS — H43821 Vitreomacular adhesion, right eye: Secondary | ICD-10-CM | POA: Diagnosis not present

## 2023-08-10 DIAGNOSIS — H02836 Dermatochalasis of left eye, unspecified eyelid: Secondary | ICD-10-CM | POA: Diagnosis not present

## 2023-08-10 DIAGNOSIS — D3131 Benign neoplasm of right choroid: Secondary | ICD-10-CM | POA: Diagnosis not present

## 2023-08-10 DIAGNOSIS — H34831 Tributary (branch) retinal vein occlusion, right eye, with macular edema: Secondary | ICD-10-CM | POA: Diagnosis not present

## 2023-08-25 DIAGNOSIS — K219 Gastro-esophageal reflux disease without esophagitis: Secondary | ICD-10-CM | POA: Diagnosis not present

## 2023-08-25 DIAGNOSIS — M5442 Lumbago with sciatica, left side: Secondary | ICD-10-CM | POA: Diagnosis not present

## 2023-08-25 DIAGNOSIS — M5441 Lumbago with sciatica, right side: Secondary | ICD-10-CM | POA: Diagnosis not present

## 2023-08-25 DIAGNOSIS — Z713 Dietary counseling and surveillance: Secondary | ICD-10-CM | POA: Diagnosis not present

## 2023-08-25 DIAGNOSIS — I10 Essential (primary) hypertension: Secondary | ICD-10-CM | POA: Diagnosis not present

## 2023-08-25 DIAGNOSIS — G894 Chronic pain syndrome: Secondary | ICD-10-CM | POA: Diagnosis not present

## 2023-08-25 DIAGNOSIS — Z87891 Personal history of nicotine dependence: Secondary | ICD-10-CM | POA: Diagnosis not present

## 2023-08-25 DIAGNOSIS — G629 Polyneuropathy, unspecified: Secondary | ICD-10-CM | POA: Diagnosis not present

## 2023-10-12 DIAGNOSIS — H02833 Dermatochalasis of right eye, unspecified eyelid: Secondary | ICD-10-CM | POA: Diagnosis not present

## 2023-10-12 DIAGNOSIS — D3131 Benign neoplasm of right choroid: Secondary | ICD-10-CM | POA: Diagnosis not present

## 2023-10-12 DIAGNOSIS — H34831 Tributary (branch) retinal vein occlusion, right eye, with macular edema: Secondary | ICD-10-CM | POA: Diagnosis not present

## 2023-10-12 DIAGNOSIS — H3411 Central retinal artery occlusion, right eye: Secondary | ICD-10-CM | POA: Diagnosis not present

## 2023-10-12 DIAGNOSIS — H02836 Dermatochalasis of left eye, unspecified eyelid: Secondary | ICD-10-CM | POA: Diagnosis not present

## 2023-10-12 DIAGNOSIS — H43821 Vitreomacular adhesion, right eye: Secondary | ICD-10-CM | POA: Diagnosis not present

## 2023-10-12 DIAGNOSIS — H43812 Vitreous degeneration, left eye: Secondary | ICD-10-CM | POA: Diagnosis not present

## 2023-11-11 DIAGNOSIS — Z961 Presence of intraocular lens: Secondary | ICD-10-CM | POA: Diagnosis not present

## 2023-11-11 DIAGNOSIS — H3582 Retinal ischemia: Secondary | ICD-10-CM | POA: Diagnosis not present

## 2023-11-19 DIAGNOSIS — R7303 Prediabetes: Secondary | ICD-10-CM | POA: Diagnosis not present

## 2023-11-19 DIAGNOSIS — Z125 Encounter for screening for malignant neoplasm of prostate: Secondary | ICD-10-CM | POA: Diagnosis not present

## 2023-11-19 DIAGNOSIS — E782 Mixed hyperlipidemia: Secondary | ICD-10-CM | POA: Diagnosis not present

## 2023-11-25 DIAGNOSIS — H269 Unspecified cataract: Secondary | ICD-10-CM | POA: Diagnosis not present

## 2023-11-25 DIAGNOSIS — E782 Mixed hyperlipidemia: Secondary | ICD-10-CM | POA: Diagnosis not present

## 2023-11-25 DIAGNOSIS — K219 Gastro-esophageal reflux disease without esophagitis: Secondary | ICD-10-CM | POA: Diagnosis not present

## 2023-11-25 DIAGNOSIS — Z0000 Encounter for general adult medical examination without abnormal findings: Secondary | ICD-10-CM | POA: Diagnosis not present

## 2023-11-25 DIAGNOSIS — G629 Polyneuropathy, unspecified: Secondary | ICD-10-CM | POA: Diagnosis not present

## 2023-11-25 DIAGNOSIS — M5441 Lumbago with sciatica, right side: Secondary | ICD-10-CM | POA: Diagnosis not present

## 2023-11-25 DIAGNOSIS — I10 Essential (primary) hypertension: Secondary | ICD-10-CM | POA: Diagnosis not present

## 2023-11-25 DIAGNOSIS — M5442 Lumbago with sciatica, left side: Secondary | ICD-10-CM | POA: Diagnosis not present

## 2023-11-25 DIAGNOSIS — G894 Chronic pain syndrome: Secondary | ICD-10-CM | POA: Diagnosis not present

## 2023-12-16 DIAGNOSIS — H3411 Central retinal artery occlusion, right eye: Secondary | ICD-10-CM | POA: Diagnosis not present

## 2023-12-16 DIAGNOSIS — D3131 Benign neoplasm of right choroid: Secondary | ICD-10-CM | POA: Diagnosis not present

## 2023-12-16 DIAGNOSIS — H02836 Dermatochalasis of left eye, unspecified eyelid: Secondary | ICD-10-CM | POA: Diagnosis not present

## 2023-12-16 DIAGNOSIS — H02833 Dermatochalasis of right eye, unspecified eyelid: Secondary | ICD-10-CM | POA: Diagnosis not present

## 2023-12-16 DIAGNOSIS — H43821 Vitreomacular adhesion, right eye: Secondary | ICD-10-CM | POA: Diagnosis not present

## 2023-12-16 DIAGNOSIS — H43812 Vitreous degeneration, left eye: Secondary | ICD-10-CM | POA: Diagnosis not present

## 2023-12-16 DIAGNOSIS — H34831 Tributary (branch) retinal vein occlusion, right eye, with macular edema: Secondary | ICD-10-CM | POA: Diagnosis not present
